# Patient Record
Sex: Male | Born: 1965
Health system: Southern US, Community
[De-identification: ages and names within clinical notes are randomized; demographics above are authoritative.]

## PROBLEM LIST (undated history)

## (undated) DIAGNOSIS — I8390 Asymptomatic varicose veins of unspecified lower extremity: Secondary | ICD-10-CM

## (undated) DIAGNOSIS — R32 Unspecified urinary incontinence: Secondary | ICD-10-CM

## (undated) DIAGNOSIS — F509 Eating disorder, unspecified: Secondary | ICD-10-CM

## (undated) DIAGNOSIS — E559 Vitamin D deficiency, unspecified: Secondary | ICD-10-CM

## (undated) DIAGNOSIS — L0291 Cutaneous abscess, unspecified: Secondary | ICD-10-CM

## (undated) DIAGNOSIS — F329 Major depressive disorder, single episode, unspecified: Secondary | ICD-10-CM

## (undated) DIAGNOSIS — F32A Depression, unspecified: Secondary | ICD-10-CM

## (undated) DIAGNOSIS — R519 Headache, unspecified: Secondary | ICD-10-CM

## (undated) DIAGNOSIS — H6690 Otitis media, unspecified, unspecified ear: Secondary | ICD-10-CM

## (undated) DIAGNOSIS — G43909 Migraine, unspecified, not intractable, without status migrainosus: Secondary | ICD-10-CM

## (undated) DIAGNOSIS — K759 Inflammatory liver disease, unspecified: Secondary | ICD-10-CM

## (undated) DIAGNOSIS — D494 Neoplasm of unspecified behavior of bladder: Secondary | ICD-10-CM

## (undated) DIAGNOSIS — K219 Gastro-esophageal reflux disease without esophagitis: Secondary | ICD-10-CM

## (undated) DIAGNOSIS — R768 Other specified abnormal immunological findings in serum: Secondary | ICD-10-CM

## (undated) DIAGNOSIS — R51 Headache: Secondary | ICD-10-CM

## (undated) HISTORY — DX: Eating disorder, unspecified: F50.9

## (undated) HISTORY — DX: Headache: R51

## (undated) HISTORY — DX: Headache, unspecified: R51.9

## (undated) HISTORY — DX: Migraine, unspecified, not intractable, without status migrainosus: G43.909

## (undated) HISTORY — DX: Inflammatory liver disease, unspecified: K75.9

## (undated) HISTORY — DX: Unspecified urinary incontinence: R32

## (undated) HISTORY — DX: Gastro-esophageal reflux disease without esophagitis: K21.9

---

## 2005-04-02 ENCOUNTER — Ambulatory Visit: Payer: Self-pay | Admitting: Internal Medicine

## 2005-11-19 ENCOUNTER — Ambulatory Visit (HOSPITAL_COMMUNITY): Admission: RE | Admit: 2005-11-19 | Discharge: 2005-11-19 | Payer: Self-pay | Admitting: Internal Medicine

## 2005-11-19 ENCOUNTER — Ambulatory Visit: Payer: Self-pay | Admitting: Internal Medicine

## 2005-12-03 ENCOUNTER — Ambulatory Visit: Payer: Self-pay | Admitting: Internal Medicine

## 2006-06-01 ENCOUNTER — Ambulatory Visit: Payer: Self-pay | Admitting: Internal Medicine

## 2006-06-01 ENCOUNTER — Ambulatory Visit (HOSPITAL_COMMUNITY): Admission: RE | Admit: 2006-06-01 | Discharge: 2006-06-01 | Payer: Self-pay | Admitting: Internal Medicine

## 2013-09-27 ENCOUNTER — Encounter (HOSPITAL_COMMUNITY): Payer: Self-pay | Admitting: Emergency Medicine

## 2013-09-27 ENCOUNTER — Emergency Department (HOSPITAL_COMMUNITY)
Admission: EM | Admit: 2013-09-27 | Discharge: 2013-09-27 | Disposition: A | Discharge status: Home / Self Care | Payer: Self-pay | Attending: Emergency Medicine | Admitting: Emergency Medicine

## 2013-09-27 DIAGNOSIS — L0231 Cutaneous abscess of buttock: Secondary | ICD-10-CM | POA: Insufficient documentation

## 2013-09-27 DIAGNOSIS — Z79899 Other long term (current) drug therapy: Secondary | ICD-10-CM | POA: Insufficient documentation

## 2013-09-27 DIAGNOSIS — F17200 Nicotine dependence, unspecified, uncomplicated: Secondary | ICD-10-CM | POA: Insufficient documentation

## 2013-09-27 DIAGNOSIS — L0291 Cutaneous abscess, unspecified: Secondary | ICD-10-CM

## 2013-09-27 MED ORDER — OXYCODONE-ACETAMINOPHEN 5-325 MG PO TABS: 2.0000 | ORAL_TABLET | ORAL | Status: DC | PRN

## 2013-09-27 MED ORDER — CEPHALEXIN 500 MG PO CAPS: 500.0000 mg | ORAL_CAPSULE | Freq: Four times a day (QID) | ORAL | Status: DC

## 2013-09-27 MED ORDER — SULFAMETHOXAZOLE-TRIMETHOPRIM 800-160 MG PO TABS: 1.0000 | ORAL_TABLET | Freq: Two times a day (BID) | ORAL | Status: DC

## 2013-09-27 NOTE — ED Notes (Signed)
Pt discharged home with all belongings, alert and ambulatory upon discharge, 3 new rx prescribed pt verbalizes understanding of discharge instructions, NAD, no narcotics given in ED, pt drove self home

## 2013-09-27 NOTE — ED Provider Notes (Signed)
CSN: [id number]     Arrival date & time 09/27/13  0259 History   First MD Initiated Contact with Patient 09/27/13 [phone]     Chief Complaint  Patient presents with  . Abscess   (Consider location/radiation/quality/duration/timing/severity/associated sxs/prior Treatment) HPI Comments: Patient is a 46 year old male with no significant past medical history who presents with an abscess to the right buttock for the past week. Symptoms started gradually and progressively worsened. Patient reports throbbing pain to the affected area that does not radiate. The pain is severe and palpation of the area makes the pain worse. He reports associated redness and swelling. No alleviating factors. No associated symptoms. Patient denies fever, abdominal pain.    History reviewed. No pertinent past medical history. History reviewed. No pertinent past surgical history. No family history on file. History  Substance Use Topics  . Smoking status: Current Every Day Smoker  . Smokeless tobacco: Not on file  . Alcohol Use: Yes     Comment: rare    Review of Systems  Constitutional: Negative for fever, chills and fatigue.  HENT: Negative for trouble swallowing.   Eyes: Negative for visual disturbance.  Respiratory: Negative for shortness of breath.   Cardiovascular: Negative for chest pain and palpitations.  Gastrointestinal: Negative for nausea, vomiting, abdominal pain and diarrhea.  Genitourinary: Negative for dysuria and difficulty urinating.  Musculoskeletal: Negative for arthralgias and neck pain.  Skin: Positive for color change and wound.  Neurological: Negative for dizziness and weakness.  Psychiatric/Behavioral: Negative for dysphoric mood.    Allergies  Review of patient's allergies indicates no known allergies.  Home Medications   Current Outpatient Rx  Name  Route  Sig  Dispense  Refill  . Pseudoeph-Doxylamine-DM-APAP (NYQUIL PO)   Oral   Take 1-2 capsules by mouth at bedtime. Cough and  sleep          BP 107/62  Pulse 82  Temp(Src) 97.7 F (36.5 C) (Oral)  Resp 20  Wt 265 lb (120.203 kg)  SpO2 97% Physical Exam  Nursing note and vitals reviewed. Constitutional: He is oriented to person, place, and time. He appears well-developed and well-nourished. No distress.  HENT:  Head: Normocephalic and atraumatic.  Eyes: Conjunctivae and EOM are normal.  Neck: Normal range of motion.  Cardiovascular: Normal rate and regular rhythm.  Exam reveals no gallop and no friction rub.   No murmur heard. Pulmonary/Chest: Effort normal and breath sounds normal. He has no wheezes. He has no rales. He exhibits no tenderness.  Abdominal: Soft. He exhibits no distension. There is no tenderness. There is no rebound and no guarding.  Genitourinary:  6x4cm area of erythema and induration with central fluctuance of right buttock near anus. The area is tender to palpation. No rectal involvement noted on rectal exam.   Musculoskeletal: Normal range of motion.  Neurological: He is alert and oriented to person, place, and time. Coordination normal.  Speech is goal-oriented. Moves limbs without ataxia.   Skin: Skin is warm and dry.  Psychiatric: He has a normal mood and affect. His behavior is normal.    ED Course  Procedures (including critical care time)  INCISION AND DRAINAGE Performed by: Emilia Beck Consent: Verbal consent obtained. Risks and benefits: risks, benefits and alternatives were discussed Type: abscess  Body area: right buttock  Anesthesia: local infiltration  Incision was made with a scalpel.  Local anesthetic: lidocaine 2% without epinephrine  Anesthetic total: 3 ml  Complexity: complex Blunt dissection to break up loculations  Drainage: purulent  Drainage amount: 30 mL  Packing material: none  Patient tolerance: Patient tolerated the procedure well with no immediate complications.      Labs Review Labs Reviewed - No data to display Imaging  Review No results found.  EKG Interpretation   None       MDM   1. Abscess and cellulitis     6:54 AM Abscess drained without complications. Wound cultured. Patient will be discharged with Percocet and bactrim and Keflex for cellulitis. Vitals stable and patient afebrile. No rectal involvement noted. Patient instructed to return with worsening or concerning symptoms.    Emilia Beck, [place] 09/27/13 [phone]

## 2013-09-27 NOTE — ED Provider Notes (Signed)
Medical screening examination/treatment/procedure(s) were performed by non-physician practitioner and as supervising physician I was immediately available for consultation/collaboration.   Dione Booze, MD 09/27/13 [phone]

## 2013-09-27 NOTE — ED Notes (Addendum)
Pt is here with large abscess to rightt buttocks into rectal area.  Pt states started hurting and getting larger on Saturday.  No diabetes.  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

## 2013-09-27 NOTE — ED Notes (Signed)
Pt has a large size nondraining abscess to right buttocks area.  Pt can only lay on right side for comfort.

## 2016-06-17 ENCOUNTER — Ambulatory Visit (INDEPENDENT_AMBULATORY_CARE_PROVIDER_SITE_OTHER): Payer: Managed Care, Other (non HMO) | Admitting: Family Medicine

## 2016-06-17 VITALS — BP 122/72 | HR 82 | Temp 98.1°F | Resp 17 | Ht 75.5 in | Wt 267.0 lb

## 2016-06-17 DIAGNOSIS — M5442 Lumbago with sciatica, left side: Secondary | ICD-10-CM

## 2016-06-17 MED ORDER — KETOROLAC TROMETHAMINE 30 MG/ML IJ SOLN
30.0000 mg | Freq: Once | INTRAMUSCULAR | Status: AC
  Administered 2016-06-17: 30 mg via INTRAMUSCULAR

## 2016-06-17 MED ORDER — KETOROLAC TROMETHAMINE 30 MG/ML IJ SOLN: 30.0000 mg | Freq: Once | INTRAMUSCULAR | Status: DC

## 2016-06-17 NOTE — Progress Notes (Signed)
   Christian Kennedy is a 50 y.o. male who presents to [facility] today for back pain  1.  Back pain:  Describes aching pain in BL lumbar region of back, worse when sitting for prolonged periods of time.  Started on Monday when he was bending over to pick up something at work. Felt sudden sharp stabbing pain in her left lower back. Radiates to her right lower back..  Not worse on left versus right side. Pain is 8 / 10, and is relieved with 600 mg Ibuprofen.  Has been taking tramadol at home but this doesn't help..  Does little baseline exercise.  No prior history of back problems.   Does have some radiation of tingling and cold sensation left back of thigh to left knee.   No LE weakness or changes in gait.  No motor weakness/decreased sensation BL LE's.  No fevers or chills.  No dysuria, hematuria, urinary frequency, incontinence of bladder or bowel.      ROS as above.  Marland Kitchen   PMH reviewed. Patient is a nonsmoker.   No past medical history on file. No past surgical history on file.   Family History: No history of back issues or cancers.  Medications reviewed. Current Outpatient Prescriptions  Medication Sig Dispense Refill  . omeprazole (PRILOSEC) 10 MG capsule Take 10 mg by mouth daily.     No current facility-administered medications for this visit.      Physical Exam:  BP 122/72 (BP Location: Right Arm, Patient Position: Sitting, Cuff Size: Normal)   Pulse 82   Temp 98.1 F (36.7 C) (Oral)   Resp 17   Ht 6' 3.5" (1.918 m)   Wt 267 lb (121.1 kg)   SpO2 98%   BMI 32.93 kg/m  Gen:  Alert, cooperative patient who appears stated age in no acute distress.  Vital signs reviewed. HEENT: EOMI,  MMM Pulm:  Clear to auscultation bilaterally with good air movement.  No wheezes or rales noted.   Back:  Normal skin, Spine with normal alignment and no deformity.  No tenderness to vertebral process palpation.  Paraspinous muscles are tender and with palpable spasm Left lumbar  region.   Range of motion is full at neck and decreased at lumbar sacral regions.  Straight leg raise is positive for Left sided back pain Neuro:  Sensation and motor function 5/5 bilateral lower extremities.  Patellar and Achilles  DTR's +1 patellar BL.  He is able to walk on his heels and toes without difficulty.    Assessment and Plan:  1.  Lumbago with sciatica on Left side: - No red flags on exam or by history. -Does have palpable muscle spasm left lower lumbar region. -He has a friend who is a Restaurant manager, fast food and he himself has been trained in spite of manipulation. He'll make an appointment to see a chiropractor. -Continue ibuprofen for anti-inflammatory and pain reliever effects. Toradol 30 mg given here today for the same. He is planning on going back to work now. -Flexeril as needed for muscle relaxant. Drowsiness precautions provided. -Heat and massage as needed.  See instructions.

## 2016-06-17 NOTE — Patient Instructions (Addendum)
You have a bad muscle spasm.  Take the Ibuprofen 600 mg every 8 hours or so for pain relief and anti-inflammatory effects.  Take the Flexeril as a muscle relaxer at night. This may make you drowsy and if it does do not take during the day.  Heat and massage are also great to help relieve the pain.  This can last for the next 7-10 days. If you're still having issues in the next 2 weeks come back and see [place].  If you start having worsening pain despite the treatment don't wait and come back immediately.   Sciatica Sciatica is pain, weakness, numbness, or tingling along the path of the sciatic nerve. The nerve starts in the lower back and runs down the back of each leg. The nerve controls the muscles in the lower leg and in the back of the knee, while also providing sensation to the back of the thigh, lower leg, and the sole of your foot. Sciatica is a symptom of another medical condition. For instance, nerve damage or certain conditions, such as a herniated disk or bone spur on the spine, pinch or put pressure on the sciatic nerve. This causes the pain, weakness, or other sensations normally associated with sciatica. Generally, sciatica only affects one side of the body. CAUSES   Herniated or slipped disc.  Degenerative disk disease.  A pain disorder involving the narrow muscle in the buttocks (piriformis syndrome).  Pelvic injury or fracture.  Pregnancy.  Tumor (rare). SYMPTOMS  Symptoms can vary from mild to very severe. The symptoms usually travel from the low back to the buttocks and down the back of the leg. Symptoms can include:  Mild tingling or dull aches in the lower back, leg, or hip.  Numbness in the back of the calf or sole of the foot.  Burning sensations in the lower back, leg, or hip.  Sharp pains in the lower back, leg, or hip.  Leg weakness.  Severe back pain inhibiting movement. These symptoms may get worse with coughing, sneezing, laughing, or prolonged  sitting or standing. Also, being overweight may worsen symptoms. DIAGNOSIS  Your caregiver will perform a physical exam to look for common symptoms of sciatica. He or she may ask you to do certain movements or activities that would trigger sciatic nerve pain. Other tests may be performed to find the cause of the sciatica. These may include:  Blood tests.  X-rays.  Imaging tests, such as an MRI or CT scan. TREATMENT  Treatment is directed at the cause of the sciatic pain. Sometimes, treatment is not necessary and the pain and discomfort goes away on its own. If treatment is needed, your caregiver may suggest:  Over-the-counter medicines to relieve pain.  Prescription medicines, such as anti-inflammatory medicine, muscle relaxants, or narcotics.  Applying heat or ice to the painful area.  Steroid injections to lessen pain, irritation, and inflammation around the nerve.  Reducing activity during periods of pain.  Exercising and stretching to strengthen your abdomen and improve flexibility of your spine. Your caregiver may suggest losing weight if the extra weight makes the back pain worse.  Physical therapy.  Surgery to eliminate what is pressing or pinching the nerve, such as a bone spur or part of a herniated disk. HOME CARE INSTRUCTIONS   Only take over-the-counter or prescription medicines for pain or discomfort as directed by your caregiver.  Apply ice to the affected area for 20 minutes, 3-4 times a day for the first 48-72 hours. Then  try heat in the same way.  Exercise, stretch, or perform your usual activities if these do not aggravate your pain.  Attend physical therapy sessions as directed by your caregiver.  Keep all follow-up appointments as directed by your caregiver.  Do not wear high heels or shoes that do not provide proper support.  Check your mattress to see if it is too soft. A firm mattress may lessen your pain and discomfort. SEEK IMMEDIATE MEDICAL CARE IF:    You lose control of your bowel or bladder (incontinence).  You have increasing weakness in the lower back, pelvis, buttocks, or legs.  You have redness or swelling of your back.  You have a burning sensation when you urinate.  You have pain that gets worse when you lie down or awakens you at night.  Your pain is worse than you have experienced in the past.  Your pain is lasting longer than 4 weeks.  You are suddenly losing weight without reason. MAKE SURE YOU:  Understand these instructions.  Will watch your condition.  Will get help right away if you are not doing well or get worse.   This information is not intended to replace advice given to you by your health care provider. Make sure you discuss any questions you have with your health care provider.   Document Released: 09/08/2001 Document Revised: 06/05/2015 Document Reviewed: 01/24/2012 Elsevier Interactive Patient Education 2016 [employer].    IF you received an x-ray today, you will receive an invoice from [facility]. Please contact [facility] at [phone] with questions or concerns regarding your invoice.   IF you received labwork today, you will receive an invoice from [employer]. Please contact [facility] at [phone] with questions or concerns regarding your invoice.   Our billing staff will not be able to assist you with questions regarding bills from these companies.  You will be contacted with the lab results as soon as they are available. The fastest way to get your results is to activate your My Chart account. Instructions are located on the last page of this paperwork. If you have not heard from [place] regarding the results in 2 weeks, please contact this office.

## 2016-06-29 ENCOUNTER — Telehealth: Payer: Self-pay | Admitting: Family Medicine

## 2016-06-29 ENCOUNTER — Other Ambulatory Visit: Payer: Self-pay | Admitting: Emergency Medicine

## 2016-06-29 MED ORDER — CYCLOBENZAPRINE HCL 10 MG PO TABS
10.0000 mg | ORAL_TABLET | Freq: Three times a day (TID) | ORAL | 0 refills | Status: DC | PRN
Start: 2016-06-29 — End: 2016-06-29

## 2016-06-29 MED ORDER — CYCLOBENZAPRINE HCL 10 MG PO TABS: 10.0000 mg | ORAL_TABLET | Freq: Three times a day (TID) | ORAL | 0 refills | Status: DC | PRN

## 2016-06-29 NOTE — Telephone Encounter (Signed)
Pt daughter calling wanting the correct medicine sent to the [facility] [place] in [place] it should be flexeril not Prilosec

## 2016-06-29 NOTE — Progress Notes (Unsigned)
Flexeril 10 mg tab as needed #30, [facility] refill [facility]  Incorrect medication ordered per Dr. Mingo Amber note

## 2016-06-30 NOTE — Telephone Encounter (Signed)
Spoke with pt and pharmacy. Pt has correct medication now.

## 2017-08-23 ENCOUNTER — Telehealth: Payer: Self-pay | Admitting: General Practice

## 2017-08-23 NOTE — Telephone Encounter (Signed)
Pt wife called asking if you will except as a new patient  Please advise

## 2017-08-23 NOTE — Telephone Encounter (Signed)
Ok Thx 

## 2017-08-24 NOTE — Telephone Encounter (Signed)
Tried contacting patient to give response and none of the phone numbers work for the patient.

## 2017-08-26 NOTE — Telephone Encounter (Signed)
A new pt appt cannot be worked in sooner

## 2017-08-26 NOTE — Telephone Encounter (Signed)
I have scheduled patient for 09/15/17 at 1:45pm.  Spouse is requesting patient to be worked in sooner due to stomach pain.  I have advised spouse to take patient to ED or urgent care but would still inquire if new pt appt could be worked in sooner.

## 2017-08-27 NOTE — Telephone Encounter (Signed)
Tried to leave patient's spouse a vm but no VM set up and one number out of service.

## 2017-09-15 ENCOUNTER — Ambulatory Visit: Payer: BLUE CROSS/BLUE SHIELD | Admitting: Internal Medicine

## 2017-09-15 ENCOUNTER — Other Ambulatory Visit (INDEPENDENT_AMBULATORY_CARE_PROVIDER_SITE_OTHER): Payer: BLUE CROSS/BLUE SHIELD

## 2017-09-15 ENCOUNTER — Encounter: Payer: Self-pay | Admitting: Internal Medicine

## 2017-09-15 VITALS — BP 124/78 | HR 92 | Temp 98.7°F | Ht 75.5 in | Wt 283.0 lb

## 2017-09-15 DIAGNOSIS — R140 Abdominal distension (gaseous): Secondary | ICD-10-CM | POA: Diagnosis not present

## 2017-09-15 DIAGNOSIS — E559 Vitamin D deficiency, unspecified: Secondary | ICD-10-CM

## 2017-09-15 DIAGNOSIS — F4321 Adjustment disorder with depressed mood: Secondary | ICD-10-CM

## 2017-09-15 DIAGNOSIS — Z0000 Encounter for general adult medical examination without abnormal findings: Secondary | ICD-10-CM

## 2017-09-15 DIAGNOSIS — R1031 Right lower quadrant pain: Secondary | ICD-10-CM | POA: Diagnosis not present

## 2017-09-15 DIAGNOSIS — Z634 Disappearance and death of family member: Secondary | ICD-10-CM

## 2017-09-15 DIAGNOSIS — R300 Dysuria: Secondary | ICD-10-CM | POA: Insufficient documentation

## 2017-09-15 DIAGNOSIS — R112 Nausea with vomiting, unspecified: Secondary | ICD-10-CM | POA: Diagnosis not present

## 2017-09-15 DIAGNOSIS — K2100 Gastro-esophageal reflux disease with esophagitis, without bleeding: Secondary | ICD-10-CM

## 2017-09-15 DIAGNOSIS — R5382 Chronic fatigue, unspecified: Secondary | ICD-10-CM

## 2017-09-15 DIAGNOSIS — R1011 Right upper quadrant pain: Secondary | ICD-10-CM | POA: Insufficient documentation

## 2017-09-15 DIAGNOSIS — G43909 Migraine, unspecified, not intractable, without status migrainosus: Secondary | ICD-10-CM | POA: Insufficient documentation

## 2017-09-15 DIAGNOSIS — E291 Testicular hypofunction: Secondary | ICD-10-CM

## 2017-09-15 DIAGNOSIS — R5383 Other fatigue: Secondary | ICD-10-CM | POA: Insufficient documentation

## 2017-09-15 LAB — CBC WITH DIFFERENTIAL/PLATELET
BASOS ABS: 0 10*3/uL (ref 0.0–0.1)
Basophils Relative: 0.5 % (ref 0.0–3.0)
EOS ABS: 0.1 10*3/uL (ref 0.0–0.7)
Eosinophils Relative: 1.5 % (ref 0.0–5.0)
HCT: 46.8 % (ref 39.0–52.0)
Hemoglobin: 15.9 g/dL (ref 13.0–17.0)
LYMPHS ABS: 2.4 10*3/uL (ref 0.7–4.0)
Lymphocytes Relative: 24.5 % (ref 12.0–46.0)
MCHC: 34 g/dL (ref 30.0–36.0)
MCV: 91.3 fl (ref 78.0–100.0)
MONO ABS: 0.5 10*3/uL (ref 0.1–1.0)
Monocytes Relative: 5.6 % (ref 3.0–12.0)
NEUTROS PCT: 67.9 % (ref 43.0–77.0)
Neutro Abs: 6.5 10*3/uL (ref 1.4–7.7)
Platelets: 219 10*3/uL (ref 150.0–400.0)
RBC: 5.13 Mil/uL (ref 4.22–5.81)
RDW: 13.3 % (ref 11.5–15.5)
WBC: 9.6 10*3/uL (ref 4.0–10.5)

## 2017-09-15 LAB — BASIC METABOLIC PANEL
BUN: 13 mg/dL (ref 6–23)
CALCIUM: 9.4 mg/dL (ref 8.4–10.5)
CO2: 28 meq/L (ref 19–32)
Chloride: 105 mEq/L (ref 96–112)
Creatinine, Ser: 0.74 mg/dL (ref 0.40–1.50)
GFR: 117.82 mL/min (ref >60.00)
GLUCOSE: 88 mg/dL (ref 70–99)
Potassium: 4.5 mEq/L (ref 3.5–5.1)
SODIUM: 139 meq/L (ref 135–145)

## 2017-09-15 LAB — LIPID PANEL
CHOL/HDL RATIO: 5
Cholesterol: 194 mg/dL (ref 0–200)
HDL: 36.1 mg/dL — AB (ref >39.00)
NonHDL: 157.97
TRIGLYCERIDES: 237 mg/dL — AB (ref 0.0–149.0)
VLDL: 47.4 mg/dL — AB (ref 0.0–40.0)

## 2017-09-15 LAB — URINALYSIS, ROUTINE W REFLEX MICROSCOPIC
BILIRUBIN URINE: NEGATIVE
KETONES UR: NEGATIVE
LEUKOCYTES UA: NEGATIVE
Nitrite: NEGATIVE
SPECIFIC GRAVITY, URINE: 1.025 (ref 1.000–1.030)
Total Protein, Urine: NEGATIVE
UROBILINOGEN UA: 0.2 (ref 0.0–1.0)
Urine Glucose: NEGATIVE
pH: 5.5 (ref 5.0–8.0)

## 2017-09-15 LAB — VITAMIN D 25 HYDROXY (VIT D DEFICIENCY, FRACTURES): VITD: 15.9 ng/mL — ABNORMAL LOW (ref 30.00–100.00)

## 2017-09-15 LAB — HEPATIC FUNCTION PANEL
ALT: 17 U/L (ref 0–53)
AST: 11 U/L (ref 0–37)
Albumin: 4.6 g/dL (ref 3.5–5.2)
Alkaline Phosphatase: 56 U/L (ref 39–117)
BILIRUBIN DIRECT: 0.1 mg/dL (ref 0.0–0.3)
BILIRUBIN TOTAL: 0.6 mg/dL (ref 0.2–1.2)
Total Protein: 7.5 g/dL (ref 6.0–8.3)

## 2017-09-15 LAB — PSA: PSA: 1.3 ng/mL (ref 0.10–4.00)

## 2017-09-15 LAB — VITAMIN B12: VITAMIN B 12: 501 pg/mL (ref 211–911)

## 2017-09-15 LAB — TESTOSTERONE: Testosterone: 261.7 ng/dL — ABNORMAL LOW (ref 300.00–890.00)

## 2017-09-15 LAB — TSH: TSH: 0.61 u[IU]/mL (ref 0.35–4.50)

## 2017-09-15 LAB — H. PYLORI ANTIBODY, IGG: H Pylori IgG: POSITIVE — AB

## 2017-09-15 LAB — SEDIMENTATION RATE: SED RATE: 7 mm/h (ref 0–20)

## 2017-09-15 LAB — LDL CHOLESTEROL, DIRECT: Direct LDL: 148 mg/dL

## 2017-09-15 MED ORDER — DEXLANSOPRAZOLE 60 MG PO CPDR: 60.0000 mg | DELAYED_RELEASE_CAPSULE | Freq: Every day | ORAL | 11 refills | Status: DC

## 2017-09-15 MED ORDER — FAMOTIDINE 40 MG PO TABS: 40.0000 mg | ORAL_TABLET | Freq: Two times a day (BID) | ORAL | 3 refills | Status: DC

## 2017-09-15 MED ORDER — PANTOPRAZOLE SODIUM 40 MG PO TBEC: 40.0000 mg | DELAYED_RELEASE_TABLET | Freq: Every day | ORAL | 3 refills | Status: DC

## 2017-09-15 NOTE — Progress Notes (Signed)
Subjective:  Patient ID: Christian Kennedy, male    DOB: 10/08/1965  Age: 51 y.o. MRN: [id number]  CC: No chief complaint on file.   HPI Christian Kennedy presents for a new pt well visit. He took ampicillin x 10 d finished last Sat - better. Urine was turbid. C/o burning w/urination x 4 mo off and on and urgency C/o depression, apathy. Son died 4 years ago. The pt has 2 living dtrs, 3 grandchildren  C/o RLQ abd pain x weeks off and on - worse C/o low libido C/o GERD - very severe; PPIs don't help as they did  Outpatient Medications Prior to Visit  Medication Sig Dispense Refill  . B Complex-C (SUPER B COMPLEX PO) Take by mouth.    . famotidine (PEPCID) 40 MG tablet Take 40 mg by mouth daily.    . cyclobenzaprine (FLEXERIL) 10 MG tablet Take 1 tablet (10 mg total) by mouth 3 (three) times daily as needed for muscle spasms. 30 tablet 0  . omeprazole (PRILOSEC) 10 MG capsule Take 10 mg by mouth daily.     No facility-administered medications prior to visit.     ROS Review of Systems  Constitutional: Negative for appetite change, fatigue and unexpected weight change.  HENT: Negative for congestion, nosebleeds, sneezing, sore throat and trouble swallowing.   Eyes: Negative for itching and visual disturbance.  Respiratory: Negative for cough.   Cardiovascular: Negative for chest pain, palpitations and leg swelling.  Gastrointestinal: Negative for abdominal distention, blood in stool, diarrhea and nausea.  Genitourinary: Positive for dysuria, frequency and urgency. Negative for hematuria.  Musculoskeletal: Negative for back pain, gait problem, joint swelling and neck pain.  Skin: Negative for rash.  Neurological: Negative for dizziness, tremors, speech difficulty and weakness.  Psychiatric/Behavioral: Negative for agitation, dysphoric mood and sleep disturbance. The patient is not nervous/anxious.     Objective:  BP 124/78 (BP Location: Right Arm, Patient Position: Sitting, Cuff Size:  Large)   Pulse 92   Temp 98.7 F (37.1 C) (Oral)   Ht 6' 3.5" (1.918 m)   Wt 283 lb (128.4 kg)   SpO2 98%   BMI 34.91 kg/m   BP Readings from Last 3 Encounters:  09/15/17 124/78  06/17/16 122/72  09/27/13 104/62    Wt Readings from Last 3 Encounters:  09/15/17 283 lb (128.4 kg)  06/17/16 267 lb (121.1 kg)  09/27/13 265 lb (120.2 kg)    Physical Exam  Constitutional: He is oriented to person, place, and time. He appears well-developed. No distress.  NAD  HENT:  Mouth/Throat: Oropharynx is clear and moist.  Eyes: Conjunctivae are normal. Pupils are equal, round, and reactive to light.  Neck: Normal range of motion. No JVD present. No thyromegaly present.  Cardiovascular: Normal rate, regular rhythm, normal heart sounds and intact distal pulses. Exam reveals no gallop and no friction rub.  No murmur heard. Pulmonary/Chest: Effort normal and breath sounds normal. No respiratory distress. He has no wheezes. He has no rales. He exhibits no tenderness.  Abdominal: Soft. Bowel sounds are normal. He exhibits no distension and no mass. There is no tenderness. There is no rebound and no guarding.  Musculoskeletal: Normal range of motion. He exhibits no edema or tenderness.  Lymphadenopathy:    He has no cervical adenopathy.  Neurological: He is alert and oriented to person, place, and time. He has normal reflexes. No cranial nerve deficit. He exhibits normal muscle tone. He displays a negative Romberg sign. Coordination and gait normal.  Skin: Skin is warm and dry. No rash noted.  Psychiatric: He has a normal mood and affect. His behavior is normal. Judgment and thought content normal.    Procedure: EKG Indication: well Impression: NSR. No acute changes.   No results found for: WBC, HGB, HCT, PLT, GLUCOSE, CHOL, TRIG, HDL, LDLDIRECT, LDLCALC, ALT, AST, NA, K, CL, CREATININE, BUN, CO2, TSH, PSA, INR, GLUF, HGBA1C, MICROALBUR  No results found.  Assessment & Plan:   There are  no diagnoses linked to this encounter. I have discontinued Mikhael Roswell's omeprazole and cyclobenzaprine. I am also having him maintain his B Complex-C (SUPER B COMPLEX PO) and famotidine.  No orders of the defined types were placed in this encounter.    Follow-up: No Follow-up on file.  Walker Kehr, MD

## 2017-09-15 NOTE — Assessment & Plan Note (Signed)
Labs US 

## 2017-09-15 NOTE — Assessment & Plan Note (Signed)
We discussed age appropriate health related issues, including available/recomended screening tests and vaccinations. We discussed a need for adhering to healthy diet and exercise. Labs were ordered to be later reviewed . All questions were answered.   

## 2017-09-15 NOTE — Assessment & Plan Note (Addendum)
CT abd Labs 

## 2017-09-16 DIAGNOSIS — Z634 Disappearance and death of family member: Secondary | ICD-10-CM

## 2017-09-16 DIAGNOSIS — K219 Gastro-esophageal reflux disease without esophagitis: Secondary | ICD-10-CM | POA: Insufficient documentation

## 2017-09-16 DIAGNOSIS — E559 Vitamin D deficiency, unspecified: Secondary | ICD-10-CM | POA: Insufficient documentation

## 2017-09-16 DIAGNOSIS — F4321 Adjustment disorder with depressed mood: Secondary | ICD-10-CM | POA: Insufficient documentation

## 2017-09-16 DIAGNOSIS — E291 Testicular hypofunction: Secondary | ICD-10-CM | POA: Insufficient documentation

## 2017-09-16 LAB — HEPATITIS C ANTIBODY
Hepatitis C Ab: NONREACTIVE
SIGNAL TO CUT-OFF: 0.06 (ref <1.00)

## 2017-09-16 MED ORDER — AMOXICILL-CLARITHRO-LANSOPRAZ PO MISC: Freq: Two times a day (BID) | ORAL | 0 refills | Status: DC

## 2017-09-16 MED ORDER — VITAMIN D3 50 MCG (2000 UT) PO CAPS: 2000.0000 [IU] | ORAL_CAPSULE | Freq: Every day | ORAL | 3 refills | Status: DC

## 2017-09-16 MED ORDER — VITAMIN D3 1.25 MG (50000 UT) PO CAPS: 1.0000 | ORAL_CAPSULE | ORAL | 0 refills | Status: DC

## 2017-09-16 NOTE — Assessment & Plan Note (Signed)
Treat H pylori

## 2017-09-16 NOTE — Assessment & Plan Note (Signed)
Start Vit D prescription 50000 iu weekly (Rx emailed to your pharmacy) followed by over-the-counter Vit D 2000 iu daily.  

## 2017-09-16 NOTE — Assessment & Plan Note (Signed)
Correct low Vit D Depression/grief is likely playing a big role

## 2017-09-16 NOTE — Assessment & Plan Note (Signed)
Son died 4 years ago 2014.  Reactive depression. Christian Kennedy has adjusted, never recovered

## 2017-09-20 ENCOUNTER — Encounter: Payer: Self-pay | Admitting: Internal Medicine

## 2017-09-22 ENCOUNTER — Encounter: Payer: Self-pay | Admitting: Internal Medicine

## 2017-09-23 ENCOUNTER — Other Ambulatory Visit: Payer: BLUE CROSS/BLUE SHIELD

## 2017-09-30 ENCOUNTER — Inpatient Hospital Stay: Admission: RE | Admit: 2017-09-30 | Payer: BLUE CROSS/BLUE SHIELD | Source: Ambulatory Visit

## 2017-10-01 ENCOUNTER — Encounter: Payer: Self-pay | Admitting: Internal Medicine

## 2017-10-04 ENCOUNTER — Ambulatory Visit (INDEPENDENT_AMBULATORY_CARE_PROVIDER_SITE_OTHER)
Admission: RE | Admit: 2017-10-04 | Discharge: 2017-10-04 | Disposition: A | Discharge status: Home / Self Care | Payer: BLUE CROSS/BLUE SHIELD | Source: Ambulatory Visit | Attending: Internal Medicine | Admitting: Internal Medicine

## 2017-10-04 DIAGNOSIS — R112 Nausea with vomiting, unspecified: Secondary | ICD-10-CM

## 2017-10-04 DIAGNOSIS — R1031 Right lower quadrant pain: Secondary | ICD-10-CM

## 2017-10-04 DIAGNOSIS — R109 Unspecified abdominal pain: Secondary | ICD-10-CM | POA: Diagnosis not present

## 2017-10-04 DIAGNOSIS — R140 Abdominal distension (gaseous): Secondary | ICD-10-CM

## 2017-10-04 MED ORDER — IOPAMIDOL (ISOVUE-300) INJECTION 61%
100.0000 mL | Freq: Once | INTRAVENOUS | Status: AC | PRN
  Administered 2017-10-04: 100 mL via INTRAVENOUS

## 2017-10-05 ENCOUNTER — Other Ambulatory Visit: Payer: Self-pay | Admitting: Internal Medicine

## 2017-10-05 DIAGNOSIS — R300 Dysuria: Secondary | ICD-10-CM

## 2017-10-05 DIAGNOSIS — N3289 Other specified disorders of bladder: Secondary | ICD-10-CM

## 2017-10-05 MED ORDER — PANTOPRAZOLE SODIUM 40 MG PO TBEC: 40.0000 mg | DELAYED_RELEASE_TABLET | Freq: Two times a day (BID) | ORAL | 3 refills | Status: DC

## 2017-10-05 MED ORDER — CLARITHROMYCIN 500 MG PO TABS: 500.0000 mg | ORAL_TABLET | Freq: Two times a day (BID) | ORAL | 0 refills | Status: DC

## 2017-10-05 MED ORDER — AMOXICILLIN 500 MG PO CAPS: 1000.0000 mg | ORAL_CAPSULE | Freq: Two times a day (BID) | ORAL | 0 refills | Status: DC

## 2017-10-05 NOTE — Progress Notes (Signed)
H pylori meds replaced

## 2017-10-21 ENCOUNTER — Telehealth: Payer: Self-pay

## 2017-10-21 NOTE — Telephone Encounter (Signed)
Key: [id number]

## 2017-10-26 ENCOUNTER — Encounter: Payer: Self-pay | Admitting: Internal Medicine

## 2017-10-26 ENCOUNTER — Ambulatory Visit: Payer: BLUE CROSS/BLUE SHIELD | Admitting: Internal Medicine

## 2017-10-26 DIAGNOSIS — D494 Neoplasm of unspecified behavior of bladder: Secondary | ICD-10-CM | POA: Diagnosis not present

## 2017-10-26 DIAGNOSIS — K2100 Gastro-esophageal reflux disease with esophagitis, without bleeding: Secondary | ICD-10-CM

## 2017-10-26 DIAGNOSIS — R1031 Right lower quadrant pain: Secondary | ICD-10-CM | POA: Diagnosis not present

## 2017-10-26 DIAGNOSIS — R768 Other specified abnormal immunological findings in serum: Secondary | ICD-10-CM | POA: Diagnosis not present

## 2017-10-26 MED ORDER — AMOXICILLIN 500 MG PO CAPS: 1000.0000 mg | ORAL_CAPSULE | Freq: Two times a day (BID) | ORAL | 0 refills | Status: DC

## 2017-10-26 MED ORDER — CLARITHROMYCIN 500 MG PO TABS: 500.0000 mg | ORAL_TABLET | Freq: Two times a day (BID) | ORAL | 0 refills | Status: AC

## 2017-10-26 NOTE — Assessment & Plan Note (Addendum)
CT 12/19 4.6 cm soft tissue mass within the urinary bladder, arising in the region of the right trigone, with likely involvement of the distal right ureter. Findings are highly suspicious for primary transitional cell carcinoma. Intramural urinary bladder hematoma may have a similar appearance but is considered less likely.   Will resch Urol appt ASAP

## 2017-10-26 NOTE — Progress Notes (Signed)
Subjective:  Patient ID: Christian Kennedy, male    DOB: 01-01-1966  Age: 52 y.o. MRN: [id number]  CC: No chief complaint on file.   HPI Christian Kennedy presents for abd pain, grief, bladder tumor f/u. He has not taken H pylori Rx DMV form  Outpatient Medications Prior to Visit  Medication Sig Dispense Refill  . B Complex-C (SUPER B COMPLEX PO) Take by mouth.    . Cholecalciferol (VITAMIN D3) 2000 units capsule Take 1 capsule (2,000 Units total) by mouth daily. 100 capsule 3  . Cholecalciferol (VITAMIN D3) 50000 units CAPS Take 1 capsule by mouth once a week. 6 capsule 0  . famotidine (PEPCID) 40 MG tablet Take 1 tablet (40 mg total) by mouth 2 (two) times daily. 180 tablet 3  . pantoprazole (PROTONIX) 40 MG tablet Take 1 tablet (40 mg total) by mouth 2 (two) times daily. 60 tablet 3  . amoxicillin (AMOXIL) 500 MG capsule Take 2 capsules (1,000 mg total) by mouth 2 (two) times daily. 40 capsule 0   No facility-administered medications prior to visit.     ROS Review of Systems  Constitutional: Negative for appetite change, fatigue and unexpected weight change.  HENT: Negative for congestion, nosebleeds, sneezing, sore throat and trouble swallowing.   Eyes: Negative for itching and visual disturbance.  Respiratory: Negative for cough.   Cardiovascular: Negative for chest pain, palpitations and leg swelling.  Gastrointestinal: Negative for abdominal distention, blood in stool, diarrhea and nausea.  Genitourinary: Negative for frequency and hematuria.  Musculoskeletal: Negative for back pain, gait problem, joint swelling and neck pain.  Skin: Negative for rash.  Neurological: Negative for dizziness, tremors, speech difficulty and weakness.  Psychiatric/Behavioral: Negative for agitation, dysphoric mood and sleep disturbance. The patient is not nervous/anxious.     Objective:  BP 128/82 (BP Location: Left Arm, Patient Position: Sitting, Cuff Size: Large)   Pulse 96   Temp 98.2 F  (36.8 C) (Oral)   Ht 6' 3.5" (1.918 m)   Wt 275 lb (124.7 kg)   SpO2 98%   BMI 33.92 kg/m   BP Readings from Last 3 Encounters:  10/26/17 128/82  09/15/17 124/78  06/17/16 122/72    Wt Readings from Last 3 Encounters:  10/26/17 275 lb (124.7 kg)  09/15/17 283 lb (128.4 kg)  06/17/16 267 lb (121.1 kg)    Physical Exam  Constitutional: He is oriented to person, place, and time. He appears well-developed. No distress.  NAD  HENT:  Mouth/Throat: Oropharynx is clear and moist.  Eyes: Conjunctivae are normal. Pupils are equal, round, and reactive to light.  Neck: Normal range of motion. No JVD present. No thyromegaly present.  Cardiovascular: Normal rate, regular rhythm, normal heart sounds and intact distal pulses. Exam reveals no gallop and no friction rub.  No murmur heard. Pulmonary/Chest: Effort normal and breath sounds normal. No respiratory distress. He has no wheezes. He has no rales. He exhibits no tenderness.  Abdominal: Soft. Bowel sounds are normal. He exhibits no distension and no mass. There is no tenderness. There is no rebound and no guarding.  Musculoskeletal: Normal range of motion. He exhibits no edema or tenderness.  Lymphadenopathy:    He has no cervical adenopathy.  Neurological: He is alert and oriented to person, place, and time. He has normal reflexes. No cranial nerve deficit. He exhibits normal muscle tone. He displays a negative Romberg sign. Coordination and gait normal.  Skin: Skin is warm and dry. No rash noted.  Psychiatric: He has  a normal mood and affect. His behavior is normal. Judgment and thought content normal.  sad  Lab Results  Component Value Date   WBC 9.6 09/15/2017   HGB 15.9 09/15/2017   HCT 46.8 09/15/2017   PLT 219.0 09/15/2017   GLUCOSE 88 09/15/2017   CHOL 194 09/15/2017   TRIG 237.0 (H) 09/15/2017   HDL 36.10 (L) 09/15/2017   LDLDIRECT 148.0 09/15/2017   ALT 17 09/15/2017   AST 11 09/15/2017   NA 139 09/15/2017   K 4.5  09/15/2017   CL 105 09/15/2017   CREATININE 0.74 09/15/2017   BUN 13 09/15/2017   CO2 28 09/15/2017   TSH 0.61 09/15/2017   PSA 1.30 09/15/2017    Ct Abdomen Pelvis W Contrast  Result Date: 10/04/2017 CLINICAL DATA:  Right-sided abdominal pain and burning with urination. EXAM: CT ABDOMEN AND PELVIS WITH CONTRAST TECHNIQUE: Multidetector CT imaging of the abdomen and pelvis was performed using the standard protocol following bolus administration of intravenous contrast. CONTRAST:  154mL ISOVUE-300 IOPAMIDOL (ISOVUE-300) INJECTION 61% COMPARISON:  Body CT 11/19/2005 FINDINGS: Lower chest: No acute abnormality. Hepatobiliary: No focal liver abnormality is seen. No gallstones, gallbladder wall thickening, or biliary dilatation. Pancreas: Unremarkable. No pancreatic ductal dilatation or surrounding inflammatory changes. Spleen: Normal in size without focal abnormality. Adrenals/Urinary Tract: Normal adrenal glands. Normal appearance of the right kidney. Benign-appearing 1.7 cm left superior pole cyst. Normal appearance of the left ureter. There is a soft tissue mass in the urinary bladder, arising in the region of the right trigone measuring 2.4 x 4.6 x 2.5 cm. There is indistinctness and thickening of the distal most right ureter. Adjacent focal fat stranding is noted. Stomach/Bowel: Stomach is within normal limits. Appendix appears normal. No evidence of bowel wall thickening, distention, or inflammatory changes. Vascular/Lymphatic: Sub pathologic by CT criteria but asymmetric right inguinal lymph nodes. Reproductive: Heterogeneous appearance of the prostate gland. Other: No abdominal wall hernia or abnormality. No abdominopelvic ascites. Musculoskeletal: No acute or significant osseous findings. IMPRESSION: 4.6 cm soft tissue mass within the urinary bladder, arising in the region of the right trigone, with likely involvement of the distal right ureter. Findings are highly suspicious for primary transitional  cell carcinoma. Intramural urinary bladder hematoma may have a similar appearance but is considered less likely. Heterogeneous appearance of the prostate gland. Please correlate to serum PSA values. Sub pathologic but asymmetric lymph nodes along the right ureter and in the right iliac chain. Likely benign left upper pole renal cyst. These results will be called to the ordering clinician or representative by the Radiologist Assistant, and communication documented in the PACS or zVision Dashboard. Electronically Signed   By: Fidela Salisbury M.D.   On: 10/04/2017 19:14    Assessment & Plan:   There are no diagnoses linked to this encounter. I have discontinued Christian Kennedy's amoxicillin. I am also having him maintain his B Complex-C (SUPER B COMPLEX PO), famotidine, Vitamin D3, Vitamin D3, and pantoprazole.  No orders of the defined types were placed in this encounter.    Follow-up: No Follow-up on file.  Walker Kehr, MD

## 2017-10-27 ENCOUNTER — Encounter: Payer: Self-pay | Admitting: Internal Medicine

## 2017-10-27 DIAGNOSIS — R768 Other specified abnormal immunological findings in serum: Secondary | ICD-10-CM

## 2017-10-27 HISTORY — DX: Other specified abnormal immunological findings in serum: R76.8

## 2017-10-27 NOTE — Assessment & Plan Note (Signed)
abx prescribed w/Protonix

## 2017-10-27 NOTE — Assessment & Plan Note (Signed)
abx prescribed w/Protonix for H pylori

## 2017-10-27 NOTE — Assessment & Plan Note (Signed)
Better - treat H pylori; abx prescribed w/Protonix

## 2017-11-11 DIAGNOSIS — R310 Gross hematuria: Secondary | ICD-10-CM | POA: Diagnosis not present

## 2017-11-11 DIAGNOSIS — D414 Neoplasm of uncertain behavior of bladder: Secondary | ICD-10-CM | POA: Diagnosis not present

## 2017-11-12 ENCOUNTER — Encounter (HOSPITAL_BASED_OUTPATIENT_CLINIC_OR_DEPARTMENT_OTHER): Payer: Self-pay

## 2017-11-12 ENCOUNTER — Other Ambulatory Visit: Payer: Self-pay | Admitting: Urology

## 2017-11-12 ENCOUNTER — Other Ambulatory Visit: Payer: Self-pay

## 2017-11-12 NOTE — Progress Notes (Signed)
Spoke with:  Zelig NPO: No food after midnight/Clear liquids until 7:00 AM DOS Arrival time: 11:00AM Labs: Hemoglobin AM medications:  Pepcid Pre op orders: No Ride home:  Christian Kennedy (wife) [phone]

## 2017-11-18 NOTE — H&P (Signed)
Urology Preoperative H&P   Chief Complaint: Bladder mass  History of Present Illness: ARIC JOST is a 52 y.o. male referred by Dayna Ramus, MD after he was found to have a large bladder lesion concerning for malignancy on recent CT abd/pel. He was evaluated in the ED in December 2018 for worsening right flank pain and gross hematuria. Today, he reports dysuria and intermittent right flank pain after voiding, but states that the hematuria has since resolved. He denies nausea/vomiting, fever/chills or interval UTIs.   Tobacco use- 1 ppd for ~30 years.  No personal/family history of GU malignancies.   Last PSA- 1.3 (08/2017)    Past Medical History:  Diagnosis Date  . Abscess    Right buttocks  . Bladder tumor   . Depression    Son passed 2014  . Ear infection   . Eating disorder   . Frequent headaches   . GERD (gastroesophageal reflux disease)   . Helicobacter pylori ab+ 10/27/2017  . Hepatitis    childhood, jaundice  . Migraines   . Urinary incontinence   . Varicose vein of leg    Right leg  . Vitamin D deficiency     History reviewed. No pertinent surgical history.  Allergies: No Known Allergies  Family History  Problem Relation Age of Onset  . Cancer Mother   . Depression Mother   . Arthritis Father   . Depression Father   . Hearing loss Father   . Depression Daughter   . Early death Son     Social History:  reports that he has been smoking cigarettes.  He has a 30.00 pack-year smoking history. he has never used smokeless tobacco. He reports that he drinks alcohol. He reports that he does not use drugs.  ROS: A complete review of systems was performed.  All systems are negative except for pertinent findings as noted.  Physical Exam:  Vital signs in last 24 hours:   Constitutional:  Alert and oriented, No acute distress Cardiovascular: Regular rate and rhythm, No JVD Respiratory: Normal respiratory effort, Lungs clear bilaterally GI: Abdomen is  soft, nontender, nondistended, no abdominal masses GU: No CVA tenderness Lymphatic: No lymphadenopathy Neurologic: Grossly intact, no focal deficits Psychiatric: Normal mood and affect  Laboratory Data:  No results for input(s): WBC, HGB, HCT, PLT in the last 72 hours.  No results for input(s): NA, K, CL, GLUCOSE, BUN, CALCIUM, CREATININE in the last 72 hours.  Invalid input(s): CO3   No results found for this or any previous visit (from the past 24 hour(s)). No results found for this or any previous visit (from the past 240 hour(s)).  Renal Function: No results for input(s): CREATININE in the last 168 hours. CrCl cannot be calculated (Patient's most recent lab result is older than the maximum 21 days allowed.).  Radiologic Imaging: No results found.  I independently reviewed the above imaging studies.  Assessment and Plan DEQUANDRE CORDOVA is a 52 y.o. male with a large bladder mass concerning for malignancy see on recent CT from 08/2017  -Recent CT findings discussed, at length, with the patient. The findings are very concerning for a possible neoplasm involving the right lateral bladder wall and possibly the right ureteral orifice.  The risks, benefits and alternatives of cystoscopy with TURBT, right retrograde pyelogram and possible right JJ stent placement was discussed with the patient. The risks included, but are not limited to, bleeding, urinary tract infection, bladder perforation requiring prolonged catheterization and/or open bladder repair, ureteral  obstruction, voiding dysfunction and the inherent risks of general anesthesia. The patient voices understanding and wishes to proceed.     Ellison Hughs, MD 11/18/2017, 3:02 PM  [facility] Pager: [phone]

## 2017-11-19 ENCOUNTER — Ambulatory Visit (HOSPITAL_BASED_OUTPATIENT_CLINIC_OR_DEPARTMENT_OTHER): Payer: BLUE CROSS/BLUE SHIELD | Admitting: Anesthesiology

## 2017-11-19 ENCOUNTER — Encounter (HOSPITAL_BASED_OUTPATIENT_CLINIC_OR_DEPARTMENT_OTHER): Payer: Self-pay

## 2017-11-19 ENCOUNTER — Ambulatory Visit (HOSPITAL_COMMUNITY): Payer: BLUE CROSS/BLUE SHIELD | Admitting: Certified Registered Nurse Anesthetist

## 2017-11-19 ENCOUNTER — Other Ambulatory Visit: Payer: Self-pay

## 2017-11-19 ENCOUNTER — Encounter (HOSPITAL_COMMUNITY)
Admission: RE | Disposition: A | Discharge status: Home / Self Care | Payer: Self-pay | Source: Ambulatory Visit | Attending: Urology

## 2017-11-19 ENCOUNTER — Inpatient Hospital Stay (HOSPITAL_BASED_OUTPATIENT_CLINIC_OR_DEPARTMENT_OTHER)
Admission: RE | Admit: 2017-11-19 | Discharge: 2017-11-23 | DRG: 657 | Disposition: A | Discharge status: Home / Self Care | Payer: BLUE CROSS/BLUE SHIELD | Source: Ambulatory Visit | Attending: Urology | Admitting: Urology

## 2017-11-19 DIAGNOSIS — D494 Neoplasm of unspecified behavior of bladder: Secondary | ICD-10-CM | POA: Diagnosis not present

## 2017-11-19 DIAGNOSIS — D62 Acute posthemorrhagic anemia: Secondary | ICD-10-CM | POA: Diagnosis not present

## 2017-11-19 DIAGNOSIS — F17210 Nicotine dependence, cigarettes, uncomplicated: Secondary | ICD-10-CM | POA: Diagnosis present

## 2017-11-19 DIAGNOSIS — C679 Malignant neoplasm of bladder, unspecified: Principal | ICD-10-CM | POA: Diagnosis present

## 2017-11-19 DIAGNOSIS — E669 Obesity, unspecified: Secondary | ICD-10-CM | POA: Diagnosis present

## 2017-11-19 DIAGNOSIS — K219 Gastro-esophageal reflux disease without esophagitis: Secondary | ICD-10-CM | POA: Diagnosis present

## 2017-11-19 DIAGNOSIS — N99820 Postprocedural hemorrhage and hematoma of a genitourinary system organ or structure following a genitourinary system procedure: Secondary | ICD-10-CM | POA: Diagnosis not present

## 2017-11-19 DIAGNOSIS — R319 Hematuria, unspecified: Secondary | ICD-10-CM | POA: Diagnosis not present

## 2017-11-19 DIAGNOSIS — Z6833 Body mass index (BMI) 33.0-33.9, adult: Secondary | ICD-10-CM

## 2017-11-19 DIAGNOSIS — I739 Peripheral vascular disease, unspecified: Secondary | ICD-10-CM | POA: Diagnosis present

## 2017-11-19 DIAGNOSIS — K5900 Constipation, unspecified: Secondary | ICD-10-CM | POA: Diagnosis not present

## 2017-11-19 DIAGNOSIS — R310 Gross hematuria: Secondary | ICD-10-CM | POA: Diagnosis not present

## 2017-11-19 DIAGNOSIS — F329 Major depressive disorder, single episode, unspecified: Secondary | ICD-10-CM | POA: Diagnosis not present

## 2017-11-19 DIAGNOSIS — N3289 Other specified disorders of bladder: Secondary | ICD-10-CM | POA: Diagnosis not present

## 2017-11-19 DIAGNOSIS — E559 Vitamin D deficiency, unspecified: Secondary | ICD-10-CM | POA: Diagnosis not present

## 2017-11-19 DIAGNOSIS — Z818 Family history of other mental and behavioral disorders: Secondary | ICD-10-CM

## 2017-11-19 DIAGNOSIS — R339 Retention of urine, unspecified: Secondary | ICD-10-CM | POA: Diagnosis not present

## 2017-11-19 HISTORY — DX: Vitamin D deficiency, unspecified: E55.9

## 2017-11-19 HISTORY — DX: Neoplasm of unspecified behavior of bladder: D49.4

## 2017-11-19 HISTORY — PX: CYSTOSCOPY WITH FULGERATION: SHX6638

## 2017-11-19 HISTORY — DX: Asymptomatic varicose veins of unspecified lower extremity: I83.90

## 2017-11-19 HISTORY — DX: Cutaneous abscess, unspecified: L02.91

## 2017-11-19 HISTORY — PX: TRANSURETHRAL RESECTION OF BLADDER TUMOR: SHX2575

## 2017-11-19 HISTORY — DX: Major depressive disorder, single episode, unspecified: F32.9

## 2017-11-19 HISTORY — DX: Otitis media, unspecified, unspecified ear: H66.90

## 2017-11-19 HISTORY — DX: Depression, unspecified: F32.A

## 2017-11-19 HISTORY — DX: Other specified abnormal immunological findings in serum: R76.8

## 2017-11-19 HISTORY — PX: CYSTOSCOPY W/ URETERAL STENT PLACEMENT: SHX1429

## 2017-11-19 LAB — TYPE AND SCREEN
ABO/RH(D): B POS
Antibody Screen: NEGATIVE

## 2017-11-19 LAB — CBC
HCT: 41.8 % (ref 39.0–52.0)
Hemoglobin: 14.1 g/dL (ref 13.0–17.0)
MCH: 31.1 pg (ref 26.0–34.0)
MCHC: 33.7 g/dL (ref 30.0–36.0)
MCV: 92.3 fL (ref 78.0–100.0)
PLATELETS: 223 10*3/uL (ref 150–400)
RBC: 4.53 MIL/uL (ref 4.22–5.81)
RDW: 13.4 % (ref 11.5–15.5)
WBC: 15.5 10*3/uL — AB (ref 4.0–10.5)

## 2017-11-19 LAB — POCT HEMOGLOBIN-HEMACUE: Hemoglobin: 15.4 g/dL (ref 13.0–17.0)

## 2017-11-19 SURGERY — CYSTOSCOPY, WITH BLADDER FULGURATION
Anesthesia: General | Site: Bladder

## 2017-11-19 SURGERY — CYSTOSCOPY, WITH RETROGRADE PYELOGRAM AND URETERAL STENT INSERTION
Anesthesia: General | Site: Ureter | Laterality: Right

## 2017-11-19 MED ORDER — FENTANYL CITRATE (PF) 100 MCG/2ML IJ SOLN
INTRAMUSCULAR | Status: AC
  Filled 2017-11-19: qty 2

## 2017-11-19 MED ORDER — MEPERIDINE HCL 25 MG/ML IJ SOLN
6.2500 mg | INTRAMUSCULAR | Status: DC | PRN
  Filled 2017-11-19: qty 1

## 2017-11-19 MED ORDER — HYDROMORPHONE HCL 1 MG/ML IJ SOLN
INTRAMUSCULAR | Status: AC
  Filled 2017-11-19: qty 1

## 2017-11-19 MED ORDER — FENTANYL CITRATE (PF) 100 MCG/2ML IJ SOLN
INTRAMUSCULAR | Status: DC | PRN
  Administered 2017-11-19 (×3): 25 ug via INTRAVENOUS
  Administered 2017-11-19: 50 ug via INTRAVENOUS
  Administered 2017-11-19 (×7): 25 ug via INTRAVENOUS

## 2017-11-19 MED ORDER — CEFAZOLIN SODIUM-DEXTROSE 2-4 GM/100ML-% IV SOLN
INTRAVENOUS | Status: AC
  Filled 2017-11-19: qty 100

## 2017-11-19 MED ORDER — MIDAZOLAM HCL 5 MG/5ML IJ SOLN
INTRAMUSCULAR | Status: DC | PRN
  Administered 2017-11-19: 2 mg via INTRAVENOUS

## 2017-11-19 MED ORDER — ONDANSETRON HCL 4 MG/2ML IJ SOLN
INTRAMUSCULAR | Status: AC
  Filled 2017-11-19: qty 2

## 2017-11-19 MED ORDER — PROPOFOL 10 MG/ML IV BOLUS
INTRAVENOUS | Status: AC
  Filled 2017-11-19: qty 20

## 2017-11-19 MED ORDER — PROPOFOL 10 MG/ML IV BOLUS
INTRAVENOUS | Status: DC | PRN
  Administered 2017-11-19: 150 mg via INTRAVENOUS

## 2017-11-19 MED ORDER — DOCUSATE SODIUM 100 MG PO CAPS
100.0000 mg | ORAL_CAPSULE | Freq: Two times a day (BID) | ORAL | Status: DC
  Administered 2017-11-19 – 2017-11-23 (×8): 100 mg via ORAL
  Filled 2017-11-19 (×8): qty 1

## 2017-11-19 MED ORDER — HYDROCODONE-ACETAMINOPHEN 5-325 MG PO TABS: 1.0000 | ORAL_TABLET | ORAL | 0 refills | Status: DC | PRN

## 2017-11-19 MED ORDER — PROMETHAZINE HCL 25 MG/ML IJ SOLN
6.2500 mg | INTRAMUSCULAR | Status: DC | PRN
  Filled 2017-11-19: qty 1

## 2017-11-19 MED ORDER — SODIUM CHLORIDE 0.9 % IR SOLN
500.0000 mL | Freq: Once | Status: DC
  Filled 2017-11-19: qty 500

## 2017-11-19 MED ORDER — OXYBUTYNIN CHLORIDE ER 10 MG PO TB24: 10.0000 mg | ORAL_TABLET | Freq: Every day | ORAL | 0 refills | Status: DC

## 2017-11-19 MED ORDER — OXYCODONE HCL 5 MG PO TABS
5.0000 mg | ORAL_TABLET | Freq: Once | ORAL | Status: DC | PRN
  Filled 2017-11-19: qty 1

## 2017-11-19 MED ORDER — BELLADONNA ALKALOIDS-OPIUM 16.2-60 MG RE SUPP
RECTAL | Status: DC | PRN
  Administered 2017-11-19: 1 via RECTAL

## 2017-11-19 MED ORDER — LIDOCAINE 2% (20 MG/ML) 5 ML SYRINGE
INTRAMUSCULAR | Status: AC
  Filled 2017-11-19: qty 5

## 2017-11-19 MED ORDER — KETOROLAC TROMETHAMINE 30 MG/ML IJ SOLN
INTRAMUSCULAR | Status: DC | PRN
  Administered 2017-11-19: 30 mg via INTRAVENOUS

## 2017-11-19 MED ORDER — ONDANSETRON HCL 4 MG/2ML IJ SOLN
INTRAMUSCULAR | Status: DC | PRN
  Administered 2017-11-19: 4 mg via INTRAVENOUS

## 2017-11-19 MED ORDER — HYDROCODONE-ACETAMINOPHEN 5-325 MG PO TABS
1.0000 | ORAL_TABLET | ORAL | Status: DC | PRN
  Administered 2017-11-20: 2 via ORAL
  Administered 2017-11-20: 1 via ORAL
  Administered 2017-11-20 – 2017-11-23 (×10): 2 via ORAL
  Filled 2017-11-19 (×13): qty 2

## 2017-11-19 MED ORDER — DEXAMETHASONE SODIUM PHOSPHATE 4 MG/ML IJ SOLN
INTRAMUSCULAR | Status: DC | PRN
  Administered 2017-11-19: 10 mg via INTRAVENOUS

## 2017-11-19 MED ORDER — LIDOCAINE 2% (20 MG/ML) 5 ML SYRINGE
INTRAMUSCULAR | Status: DC | PRN
  Administered 2017-11-19: 60 mg via INTRAVENOUS

## 2017-11-19 MED ORDER — OXYCODONE HCL 5 MG/5ML PO SOLN
5.0000 mg | Freq: Once | ORAL | Status: DC | PRN
  Filled 2017-11-19: qty 5

## 2017-11-19 MED ORDER — DEXAMETHASONE SODIUM PHOSPHATE 10 MG/ML IJ SOLN
INTRAMUSCULAR | Status: AC
  Filled 2017-11-19: qty 1

## 2017-11-19 MED ORDER — MORPHINE SULFATE (PF) 4 MG/ML IV SOLN
2.0000 mg | INTRAVENOUS | Status: DC | PRN
  Administered 2017-11-19: 2 mg via INTRAVENOUS
  Administered 2017-11-20 – 2017-11-21 (×6): 4 mg via INTRAVENOUS
  Filled 2017-11-19 (×7): qty 1

## 2017-11-19 MED ORDER — MEPERIDINE HCL 50 MG/ML IJ SOLN: 6.2500 mg | INTRAMUSCULAR | Status: DC | PRN

## 2017-11-19 MED ORDER — CIPROFLOXACIN HCL 500 MG PO TABS: 500.0000 mg | ORAL_TABLET | Freq: Two times a day (BID) | ORAL | 0 refills | Status: DC

## 2017-11-19 MED ORDER — KETOROLAC TROMETHAMINE 30 MG/ML IJ SOLN
INTRAMUSCULAR | Status: AC
  Filled 2017-11-19: qty 1

## 2017-11-19 MED ORDER — IOHEXOL 300 MG/ML  SOLN
INTRAMUSCULAR | Status: DC | PRN
  Administered 2017-11-19: 7 mL

## 2017-11-19 MED ORDER — MIDAZOLAM HCL 2 MG/2ML IJ SOLN
INTRAMUSCULAR | Status: DC | PRN
  Administered 2017-11-19 (×2): 1 mg via INTRAVENOUS

## 2017-11-19 MED ORDER — HYDROMORPHONE HCL 1 MG/ML IJ SOLN
0.2500 mg | INTRAMUSCULAR | Status: DC | PRN
  Filled 2017-11-19: qty 0.5

## 2017-11-19 MED ORDER — SUCCINYLCHOLINE CHLORIDE 200 MG/10ML IV SOSY
PREFILLED_SYRINGE | INTRAVENOUS | Status: DC | PRN
  Administered 2017-11-19: 140 mg via INTRAVENOUS

## 2017-11-19 MED ORDER — CEFAZOLIN SODIUM-DEXTROSE 2-4 GM/100ML-% IV SOLN
2.0000 g | Freq: Three times a day (TID) | INTRAVENOUS | Status: DC
  Administered 2017-11-19 – 2017-11-23 (×11): 2 g via INTRAVENOUS
  Filled 2017-11-19 (×13): qty 100

## 2017-11-19 MED ORDER — FENTANYL CITRATE (PF) 100 MCG/2ML IJ SOLN
INTRAMUSCULAR | Status: DC | PRN
  Administered 2017-11-19 (×2): 50 ug via INTRAVENOUS

## 2017-11-19 MED ORDER — LACTATED RINGERS IV SOLN
INTRAVENOUS | Status: DC | PRN
  Administered 2017-11-19 (×2): via INTRAVENOUS

## 2017-11-19 MED ORDER — MIDAZOLAM HCL 2 MG/2ML IJ SOLN
INTRAMUSCULAR | Status: AC
  Filled 2017-11-19: qty 2

## 2017-11-19 MED ORDER — LIDOCAINE HCL (CARDIAC) 20 MG/ML IV SOLN
INTRAVENOUS | Status: DC | PRN
  Administered 2017-11-19: 100 mg via INTRAVENOUS

## 2017-11-19 MED ORDER — LACTATED RINGERS IV SOLN: INTRAVENOUS | Status: DC

## 2017-11-19 MED ORDER — SODIUM CHLORIDE 0.9 % IR SOLN
Status: DC | PRN
Start: 2017-11-19 — End: 2017-11-19
  Administered 2017-11-19: 9000 mL

## 2017-11-19 MED ORDER — CEFAZOLIN SODIUM-DEXTROSE 2-3 GM-%(50ML) IV SOLR
INTRAVENOUS | Status: DC | PRN
  Administered 2017-11-19: 2 g via INTRAVENOUS

## 2017-11-19 MED ORDER — HYDROMORPHONE HCL 1 MG/ML IJ SOLN
0.2500 mg | INTRAMUSCULAR | Status: DC | PRN
  Administered 2017-11-19 (×4): 0.5 mg via INTRAVENOUS

## 2017-11-19 MED ORDER — PHENAZOPYRIDINE HCL 200 MG PO TABS: 200.0000 mg | ORAL_TABLET | Freq: Three times a day (TID) | ORAL | 0 refills | Status: DC | PRN

## 2017-11-19 MED ORDER — CEFAZOLIN SODIUM-DEXTROSE 2-4 GM/100ML-% IV SOLN
2.0000 g | Freq: Once | INTRAVENOUS | Status: AC
  Administered 2017-11-19: 2 g via INTRAVENOUS
  Filled 2017-11-19: qty 100

## 2017-11-19 MED ORDER — PROPOFOL 10 MG/ML IV BOLUS
INTRAVENOUS | Status: DC | PRN
  Administered 2017-11-19: 100 mg via INTRAVENOUS
  Administered 2017-11-19: 300 mg via INTRAVENOUS

## 2017-11-19 MED ORDER — HYDROMORPHONE HCL 1 MG/ML IJ SOLN
0.2500 mg | INTRAMUSCULAR | Status: DC | PRN
  Administered 2017-11-19 (×4): 0.5 mg via INTRAVENOUS
  Filled 2017-11-19: qty 0.5

## 2017-11-19 MED ORDER — PROMETHAZINE HCL 25 MG/ML IJ SOLN: 6.2500 mg | INTRAMUSCULAR | Status: DC | PRN

## 2017-11-19 MED ORDER — SODIUM CHLORIDE 0.9 % IR SOLN
Status: DC | PRN
  Administered 2017-11-19 (×2): 3000 mL via INTRAVESICAL
  Administered 2017-11-19: 6000 mL via INTRAVESICAL
  Administered 2017-11-19: 500 mL via INTRAVESICAL
  Administered 2017-11-19 (×3): 6000 mL via INTRAVESICAL

## 2017-11-19 MED ORDER — ONDANSETRON HCL 4 MG PO TABS: 4.0000 mg | ORAL_TABLET | Freq: Every day | ORAL | 1 refills | Status: DC | PRN

## 2017-11-19 MED ORDER — LACTATED RINGERS IV SOLN
INTRAVENOUS | Status: DC
  Filled 2017-11-19: qty 1000

## 2017-11-19 MED ORDER — ONDANSETRON HCL 4 MG/2ML IJ SOLN: 4.0000 mg | INTRAMUSCULAR | Status: DC | PRN

## 2017-11-19 MED ORDER — LACTATED RINGERS IV SOLN
INTRAVENOUS | Status: DC
  Administered 2017-11-19: 14:00:00 via INTRAVENOUS
  Administered 2017-11-19: 1000 mL via INTRAVENOUS
  Administered 2017-11-19 (×2): via INTRAVENOUS
  Filled 2017-11-19: qty 1000

## 2017-11-19 MED ORDER — SODIUM CHLORIDE 0.9 % IV SOLN
INTRAVENOUS | Status: DC
  Administered 2017-11-19 – 2017-11-23 (×7): via INTRAVENOUS

## 2017-11-19 SURGICAL SUPPLY — 38 items
APL SKNCLS STERI-STRIP NONHPOA (GAUZE/BANDAGES/DRESSINGS)
BAG DRAIN URO-CYSTO SKYTR STRL (DRAIN) ×6 IMPLANT
BAG DRN UROCATH (DRAIN) ×2
BAG URINE DRAINAGE (UROLOGICAL SUPPLIES) ×2 IMPLANT
BASKET STONE 1.7 NGAGE (UROLOGICAL SUPPLIES) IMPLANT
BASKET ZERO TIP NITINOL 2.4FR (BASKET) ×4 IMPLANT
BENZOIN TINCTURE PRP APPL 2/3 (GAUZE/BANDAGES/DRESSINGS) IMPLANT
BSKT STON RTRVL ZERO TP 2.4FR (BASKET) ×2
CATH FOLEY 3WAY 30CC 22F (CATHETERS) ×2 IMPLANT
CATH URET 5FR 28IN OPEN ENDED (CATHETERS) ×2 IMPLANT
CLOSURE WOUND 1/2 X4 (GAUZE/BANDAGES/DRESSINGS)
CLOTH BEACON ORANGE TIMEOUT ST (SAFETY) ×4 IMPLANT
FIBER LASER FLEXIVA 365 (UROLOGICAL SUPPLIES) IMPLANT
FIBER LASER TRAC TIP (UROLOGICAL SUPPLIES) IMPLANT
GLOVE BIO SURGEON STRL SZ7.5 (GLOVE) ×4 IMPLANT
GLOVE BIOGEL PI IND STRL 7.5 (GLOVE) IMPLANT
GLOVE BIOGEL PI INDICATOR 7.5 (GLOVE) ×6
GOWN STRL REUS W/TWL XL LVL3 (GOWN DISPOSABLE) ×4 IMPLANT
GUIDEWIRE ANG ZIPWIRE 038X150 (WIRE) ×2 IMPLANT
GUIDEWIRE STR DUAL SENSOR (WIRE) IMPLANT
HOLDER FOLEY CATH W/STRAP (MISCELLANEOUS) ×2 IMPLANT
INFUSOR MANOMETER BAG 3000ML (MISCELLANEOUS) ×2 IMPLANT
IV NS 1000ML (IV SOLUTION)
IV NS 1000ML BAXH (IV SOLUTION) IMPLANT
IV NS IRRIG 3000ML ARTHROMATIC (IV SOLUTION) ×22 IMPLANT
KIT RM TURNOVER CYSTO AR (KITS) ×4 IMPLANT
LOOP CUT BIPOLAR 24F LRG (ELECTROSURGICAL) ×2 IMPLANT
MANIFOLD NEPTUNE II (INSTRUMENTS) ×4 IMPLANT
NS IRRIG 500ML POUR BTL (IV SOLUTION) ×6 IMPLANT
PACK CYSTO (CUSTOM PROCEDURE TRAY) ×4 IMPLANT
STENT URET 6FRX26 CONTOUR (STENTS) ×2 IMPLANT
STRIP CLOSURE SKIN 1/2X4 (GAUZE/BANDAGES/DRESSINGS) IMPLANT
SYRINGE 10CC LL (SYRINGE) ×4 IMPLANT
SYRINGE IRR TOOMEY STRL 70CC (SYRINGE) ×2 IMPLANT
TUBE CONNECTING 12'X1/4 (SUCTIONS) ×1
TUBE CONNECTING 12X1/4 (SUCTIONS) ×3 IMPLANT
WATER STERILE IRR 3000ML UROMA (IV SOLUTION) IMPLANT
WATER STERILE IRR 500ML POUR (IV SOLUTION) IMPLANT

## 2017-11-19 SURGICAL SUPPLY — 21 items
BAG URINE DRAINAGE (UROLOGICAL SUPPLIES) ×2 IMPLANT
BAG URO CATCHER STRL LF (MISCELLANEOUS) ×3 IMPLANT
CATH HEMA 3WAY 30CC 22FR COUDE (CATHETERS) IMPLANT
CATH HEMA 3WAY 30CC 24FR COUDE (CATHETERS) ×2 IMPLANT
COVER FOOTSWITCH UNIV (MISCELLANEOUS) ×2 IMPLANT
COVER SURGICAL LIGHT HANDLE (MISCELLANEOUS) ×1 IMPLANT
ELECT LOOP 22F BIPOLAR SML (ELECTROSURGICAL) ×3
ELECT REM PT RETURN 15FT ADLT (MISCELLANEOUS) IMPLANT
ELECTRODE LOOP 22F BIPOLAR SML (ELECTROSURGICAL) ×1 IMPLANT
EVACUATOR ELLICK (MISCELLANEOUS) ×3 IMPLANT
EVACUATOR MICROVAS BLADDER (UROLOGICAL SUPPLIES) ×2 IMPLANT
GLOVE BIO SURGEON STRL SZ7.5 (GLOVE) ×3 IMPLANT
GOWN STRL REUS W/TWL XL LVL3 (GOWN DISPOSABLE) ×6 IMPLANT
LOOP CUT BIPOLAR 24F LRG (ELECTROSURGICAL) IMPLANT
PACK CYSTO (CUSTOM PROCEDURE TRAY) ×3 IMPLANT
SYR 30ML LL (SYRINGE) ×2 IMPLANT
SYRINGE IRR TOOMEY STRL 70CC (SYRINGE) ×2 IMPLANT
TUBING CONNECTING 10 (TUBING) ×2 IMPLANT
TUBING CONNECTING 10' (TUBING) ×1
TUBING UROLOGY SET (TUBING) ×3 IMPLANT
WATER STERILE IRR 500ML POUR (IV SOLUTION) ×2 IMPLANT

## 2017-11-19 NOTE — Anesthesia Postprocedure Evaluation (Signed)
Anesthesia Post Note  Patient: Christian Kennedy  Procedure(s) Performed: CYSTOSCOPY WITH FULGERATION AND CLOT EVACUATION (N/A Bladder)     Patient location during evaluation: PACU Anesthesia Type: General Level of consciousness: awake and alert Pain management: pain level controlled Vital Signs Assessment: post-procedure vital signs reviewed and stable Respiratory status: spontaneous breathing, nonlabored ventilation, respiratory function stable and patient connected to nasal cannula oxygen Cardiovascular status: blood pressure returned to baseline and stable Postop Assessment: no apparent nausea or vomiting Anesthetic complications: no    Last Vitals:  Vitals:   11/19/17 2210 11/19/17 2220  BP:  (!) 142/80  Pulse:  92  Resp: 16   Temp: 36.9 C 36.8 C  SpO2:  100%    Last Pain:  Vitals:   11/19/17 2241  TempSrc:   PainSc: [place]

## 2017-11-19 NOTE — Discharge Instructions (Signed)
MAY TAKE IBUPROFEN AFTER 9:00 PM IF NEEDED   Post Anesthesia Home Care Instructions  Activity: Get plenty of rest for the remainder of the day. A responsible individual must stay with you for 24 hours following the procedure.  For the next 24 hours, DO NOT: -Drive a car -Paediatric nurse -Drink alcoholic beverages -Take any medication unless instructed by your physician -Make any legal decisions or sign important papers.  Meals: Start with liquid foods such as gelatin or soup. Progress to regular foods as tolerated. Avoid greasy, spicy, heavy foods. If nausea and/or vomiting occur, drink only clear liquids until the nausea and/or vomiting subsides. Call your physician if vomiting continues.  Special Instructions/Symptoms: Your throat may feel dry or sore from the anesthesia or the breathing tube placed in your throat during surgery. If this causes discomfort, gargle with warm salt water. The discomfort should disappear within 24 hours.  If you had a scopolamine patch placed behind your ear for the management of post- operative nausea and/or vomiting:  1. The medication in the patch is effective for 72 hours, after which it should be removed.  Wrap patch in a tissue and discard in the trash. Wash hands thoroughly with soap and water. 2. You may remove the patch earlier than 72 hours if you experience unpleasant side effects which may include dry mouth, dizziness or visual disturbances. 3. Avoid touching the patch. Wash your hands with soap and water after contact with the patch.

## 2017-11-19 NOTE — Anesthesia Procedure Notes (Signed)
Date/Time: 11/19/2017 9:04 PM Performed by: Cynda Familia, CRNA Pre-anesthesia Checklist: Patient identified, Emergency Drugs available, Suction available and Patient being monitored Oxygen Delivery Method: Simple face mask Placement Confirmation: breath sounds checked- equal and bilateral and positive ETCO2 Dental Injury: Teeth and Oropharynx as per pre-operative assessment

## 2017-11-19 NOTE — Progress Notes (Signed)
1615 trial run on stopping CBI's to make sure that it is clear before D/C ing them. Urine began to clot, thick blood in tubing. Dr. Lovena Neighbours called to inform of clotting urine. Instructed to open CBI's to flush bladder. 1655 Hand irrigated with Saline. Received large amts of clots from bladder. Kept CBI open. Still has lots of clots.1734. Called Dr Lovena Neighbours en route to evaluate  Pt. 1745. Dr Lovena Neighbours here hand irrigated not clearing up still has clots. Decided to take back to surgery.. Awaiting on surgery to call around 1930 continues with CBI

## 2017-11-19 NOTE — Anesthesia Preprocedure Evaluation (Signed)
Anesthesia Evaluation  Patient identified by MRN, date of birth, ID band Patient awake    Reviewed: Allergy & Precautions, NPO status , Patient's Chart, lab work & pertinent test results  Airway Mallampati: II  TM Distance: >3 FB Neck ROM: Full    Dental no notable dental hx.    Pulmonary neg pulmonary ROS, Current Smoker,    Pulmonary exam normal breath sounds clear to auscultation       Cardiovascular negative cardio ROS Normal cardiovascular exam Rhythm:Regular Rate:Normal     Neuro/Psych  Headaches, Depression negative neurological ROS  negative psych ROS   GI/Hepatic negative GI ROS, Neg liver ROS, GERD  ,  Endo/Other  negative endocrine ROS  Renal/GU negative Renal ROS  negative genitourinary   Musculoskeletal negative musculoskeletal ROS (+)   Abdominal   Peds negative pediatric ROS (+)  Hematology negative hematology ROS (+)   Anesthesia Other Findings   Reproductive/Obstetrics negative OB ROS                             Anesthesia Physical Anesthesia Plan  ASA: II  Anesthesia Plan: General   Post-op Pain Management:    Induction: Intravenous  PONV Risk Score and Plan: 1 and Ondansetron  Airway Management Planned: LMA  Additional Equipment:   Intra-op Plan:   Post-operative Plan: Extubation in OR  Informed Consent: I have reviewed the patients History and Physical, chart, labs and discussed the procedure including the risks, benefits and alternatives for the proposed anesthesia with the patient or authorized representative who has indicated his/her understanding and acceptance.   Dental advisory given  Plan Discussed with: CRNA  Anesthesia Plan Comments:         Anesthesia Quick Evaluation

## 2017-11-19 NOTE — Anesthesia Procedure Notes (Signed)
Procedure Name: LMA Insertion Date/Time: 11/19/2017 1:49 PM Performed by: Justice Rocher, CRNA Pre-anesthesia Checklist: Patient identified, Emergency Drugs available, Suction available and Patient being monitored Patient Re-evaluated:Patient Re-evaluated prior to induction Oxygen Delivery Method: Circle system utilized Preoxygenation: Pre-oxygenation with 100% oxygen Induction Type: IV induction Ventilation: Mask ventilation without difficulty LMA: LMA inserted LMA Size: 5.0 Number of attempts: 1 Airway Equipment and Method: Bite block Placement Confirmation: positive ETCO2 and breath sounds checked- equal and bilateral Tube secured with: Tape Dental Injury: Teeth and Oropharynx as per pre-operative assessment

## 2017-11-19 NOTE — Transfer of Care (Signed)
Immediate Anesthesia Transfer of Care Note  Patient: Jayshon E Heist  Procedure(s) Performed: Procedure(s) (LRB): CYSTOSCOPY WITH RETROGRADE PYELOGRAM/URETERAL STENT PLACEMENT (Right) TRANSURETHRAL RESECTION OF BLADDER TUMOR (TURBT) (N/A)  Patient Location: PACU  Anesthesia Type: General  Level of Consciousness: awake, sedated, patient cooperative and responds to stimulation  Airway & Oxygen Therapy: Patient Spontanous Breathing and Patient connected to [place] O2  Post-op Assessment: Report given to PACU RN, Post -op Vital signs reviewed and stable and Patient moving all extremities  Post vital signs: Reviewed and stable  Complications: No apparent anesthesia complications

## 2017-11-19 NOTE — Anesthesia Procedure Notes (Signed)
Procedure Name: Intubation Date/Time: 11/19/2017 8:03 PM Performed by: Cynda Familia, CRNA Pre-anesthesia Checklist: Patient identified, Emergency Drugs available, Suction available and Patient being monitored Patient Re-evaluated:Patient Re-evaluated prior to induction Oxygen Delivery Method: Circle System Utilized Preoxygenation: Pre-oxygenation with 100% oxygen Induction Type: IV induction Ventilation: Mask ventilation without difficulty Laryngoscope Size: Miller and 2 Grade View: Grade I Tube type: Oral Tube size: 7.5 mm Number of attempts: 1 Airway Equipment and Method: Stylet Placement Confirmation: ETT inserted through vocal cords under direct vision,  positive ETCO2 and breath sounds checked- equal and bilateral Secured at: 21 cm Tube secured with: Tape Dental Injury: Teeth and Oropharynx as per pre-operative assessment  Comments: Smooth IV induction [place]-- intubation AM CRNA atraumatic teeth and mouth as preop--- chipped broken teeth present prior to laryngoscopy-- unchanged--- bilat BS [place]

## 2017-11-19 NOTE — Telephone Encounter (Signed)
PA approved.

## 2017-11-19 NOTE — Transfer of Care (Signed)
Immediate Anesthesia Transfer of Care Note  Patient: Christian Kennedy  Procedure(s) Performed: CYSTOSCOPY WITH FULGERATION AND CLOT EVACUATION (N/A Bladder)  Patient Location: PACU  Anesthesia Type:General  Level of Consciousness: sedated  Airway & Oxygen Therapy: Patient Spontanous Breathing and Patient connected to face mask oxygen  Post-op Assessment: Report given to RN and Post -op Vital signs reviewed and stable  Post vital signs: Reviewed and stable  Last Vitals:  Vitals:   11/19/17 1900 11/19/17 1915  BP: (!) 142/79 122/70  Pulse: 77 78  Resp: 14 14  Temp:    SpO2: 97% 97%    Last Pain:  Vitals:   11/19/17 1900  TempSrc:   PainSc: 5       Patients Stated Pain Goal: 6 (76/70/11 0034)  Complications: No apparent anesthesia complications

## 2017-11-19 NOTE — Op Note (Signed)
Operative Note  Preoperative diagnosis:  1.  5 cm bladder tumor involving the right lateral wall and immediately abutting the right ureteral orifice  Postoperative diagnosis: 1.  5 cm bladder tumor involving the right lateral wall and immediately abutting the right ureteral orifice 2.  No evidence of right ureteral tumor involvement  Procedure(s): 1.  Cystoscopy  2.  Right retrograde pyelogram with intraoperative interpretation of fluoroscopic imaging 3.  TURBT large 4.  Right diagnostic ureteroscopy 5.  Right JJ stent placement  Surgeon: Ellison Hughs, MD  Assistants: None  Anesthesia: General LMA  Complications: None  EBL: Less than 5 mL  Specimens: 1.  Superficial and deep bladder tumor   Drains/Catheters: 1.  Right 6 French by 26 cm JJ stent 2.  56 French three-way Foley catheter with 20 mL and the balloon  Intraoperative findings:   1.  5 cm papillary tumor involving the right lateral bladder wall and immediately abutting the right ureteral orifice. 2.  Solitary right collecting system with a 1 cm filling defects/ureteral narrowing involving the right distal ureter.  No filling defects  or dilation involving the proximal right ureter or right renal pelvis seen on retrograde pyelogram  Indication:  Christian Kennedy is a 52 y.o. male  referred by Dayna Ramus, MD after he was found to have a large bladder lesion concerning for malignancy on recent CT abd/pel. He was evaluated in the ED in December 2018 for worsening right flank pain and gross hematuria. Today, he reports dysuria and intermittent right flank pain after voiding, but states that the hematuria has since resolved. He denies nausea/vomiting, fever/chills or interval UTIs.   Tobacco use- 1 ppd for ~30 years.  No personal/family history of GU malignancies.   Last PSA- 1.3 (08/2017)  Description of procedure:  After informed consent was obtained, the patient was brought to the operating room and  general LMA anesthesia was administered. The patient was then placed in the dorsolithotomy position and prepped and draped in usual sterile fashion. A timeout was performed. A 21 French rigid cystoscope was then inserted into the urethral meatus and advanced into the bladder under direct vision. A complete bladder survey revealed a 5 cm papillary bladder tumor immediately abutting the right ureteral orifice.  A 5 French open-ended catheter was then inserted into the right ureteral orifice and a retrograde pyelogram was obtained, with the findings listed above.  The rigid cystoscope was then exchanged for a 26 French resectoscope with a bipolar loop working element.  The bladder tumor was then systematically resected down to the detrusor musculature.  Superficial and deep tumor margins were sent to pathology as a separate specimen.  The tumor resection bed was then extensively fulgurated until hemostasis was.  The tumor was overlying the right ureter and immediately adjacent to the right ureteral orifice.  Electrocautery had to be used adjacent to the ureteral orifice as well as the overlying ureteral sheath to obtain hemostasis.    Given the filling defect seen in the right distal ureter, the decision was made to perform a right ureteroscopy to assure there was no tumor involvement within the ureteral lumen.  A semirigid ureteroscope was then advanced into the urethra and into the bladder.  A Glidewire was then placed in the right ureter and advanced up to the right renal pelvis, under fluoroscopic guidance.  The semirigid ureteroscope was then advanced over the wire and a full survey of the lumen of the right ureter revealed no evidence of tumor involvement.  The ureteroscope was then removed, leaving the wire in place.  A rigid cystoscope was then advanced over the wire and into the bladder.  A 6 [place] JJ stent was then placed over the wire and into position within the right collecting system, confirming  placement via fluoroscopy.  The cystoscope was then removed.  A 22 French three-way Foley catheter was then placed and extensively irrigated until the irrigant returned clear.  The patient tolerated the procedure well and was transferred to the postanesthesia unit in stable condition.  Plan: Follow-up in 1 week for a voiding trial and right JJ stent removal.

## 2017-11-19 NOTE — Progress Notes (Addendum)
Patient underwent TURBT with Dr. Lovena Neighbours earlier today. Post op, the catheter began to clot off and was unable to be temporized with CBI. There is concern for an arterial bleed. Plan for return to trip to OR for cystoscopy, fulguration of bleeders.

## 2017-11-19 NOTE — Op Note (Signed)
Date of procedure: 11/19/17  Preoperative diagnosis:  1. Gross hematuria 2. Bladder tumor 3. Clot retention  Postoperative diagnosis:  1. Same  Procedure: 1. Cystoscopy 2. Clot evacuation 3. Fulguration of bleeders  Surgeon: Baruch Gouty, MD  Anesthesia: General  Complications: None  Intraoperative findings: The patient underwent a transurethral resection of bladder tumor with a very large tumor earlier today.  He developed clot retention postoperatively presents for a second look to perform clot evacuation and fulguration of bleeders.  EBL: None  Specimens: None  Drains: 24 French hematuria catheter three-way on CBI  Disposition: Stable to the postanesthesia care unit  Indication for procedure: The patient is a 52 y.o. male with history of transurethral resection of the large bladder tumor earlier today.  He developed clot retention postoperatively.  He presents now for emergent clot evacuation fulguration of bleeders.  After reviewing the management options for treatment, the patient elected to proceed with the above surgical procedure(s). We have discussed the potential benefits and risks of the procedure, side effects of the proposed treatment, the likelihood of the patient achieving the goals of the procedure, and any potential problems that might occur during the procedure or recuperation. Informed consent has been obtained.  Description of procedure: The patient was met in the preoperative area. All risks, benefits, and indications of the procedure were described in great detail. The patient consented to the procedure. Preoperative antibiotics were given. The patient was taken to the operative theater. General anesthesia was induced per the anesthesia service. The patient was then placed in the dorsal lithotomy position and prepped and draped in the usual sterile fashion. A preoperative timeout was called.   A 24 French 30 degree resectoscope sheath with visual obturator  was inserted into the patient's bladder per urethra.  A large amount of clot amounting to approximately 8 ounces was evacuated.  The resection bed was then expected which showed a large extensive area.  There was a stent in place.  Most of the resection was lateral to the stent.  There is muscle fibers seen throughout.  There is also there is extensive fat.  There was what appeared to be a venous bleed in the superior portion of the resection site.  For approximately 1/2-hour attempts were made to try to localize where this actual ooze was coming from.  It was unable to be localized.  Due to the various areas of deeper resection, the decision at this point was made to abort further attempts at coagulation due to the thin bladder wall.  Also, it does appear to be venous not arterial in nature.  The resectoscope was withdrawn and a 24 [place] hematuria catheter was placed on maximal CBI with clear pink output.  The hope would be for that the bleeding begins to subside on its own as it appears to be venous in nature.  The patient was then awoke from anesthesia and transferred in stable condition on CBI.  Plan: The patient will be transferred to the floor on maximal CBI monitor overnight.  We will tentatively plan for a second look tomorrow.  Hopefully though in the interim his urine begins to clear.  Baruch Gouty, M.D.

## 2017-11-19 NOTE — Anesthesia Preprocedure Evaluation (Signed)
Anesthesia Evaluation  Patient identified by MRN, date of birth, ID band Patient awake    Reviewed: Allergy & Precautions, NPO status , Patient's Chart, lab work & pertinent test results  Airway Mallampati: I  TM Distance: >3 FB Neck ROM: Full    Dental no notable dental hx. (+) Chipped, Dental Advisory Given,    Pulmonary neg pulmonary ROS, Current Smoker,    Pulmonary exam normal breath sounds clear to auscultation       Cardiovascular + Peripheral Vascular Disease  Normal cardiovascular exam Rhythm:Regular Rate:Normal     Neuro/Psych  Headaches, PSYCHIATRIC DISORDERS Depression negative neurological ROS     GI/Hepatic negative GI ROS, Neg liver ROS, GERD  ,  Endo/Other  negative endocrine ROS  Renal/GU negative Renal ROS  negative genitourinary   Musculoskeletal negative musculoskeletal ROS (+)   Abdominal Normal abdominal exam  (+)   Peds negative pediatric ROS (+)  Hematology negative hematology ROS (+)   Anesthesia Other Findings   Reproductive/Obstetrics negative OB ROS                             Anesthesia Physical  Anesthesia Plan  ASA: II and emergent  Anesthesia Plan: General   Post-op Pain Management:    Induction: Intravenous, Rapid sequence and Cricoid pressure planned  PONV Risk Score and Plan: 2 and Ondansetron and Midazolam  Airway Management Planned: Oral ETT  Additional Equipment:   Intra-op Plan:   Post-operative Plan: Extubation in OR  Informed Consent: I have reviewed the patients History and Physical, chart, labs and discussed the procedure including the risks, benefits and alternatives for the proposed anesthesia with the patient or authorized representative who has indicated his/her understanding and acceptance.   Dental advisory given  Plan Discussed with: CRNA  Anesthesia Plan Comments:         Anesthesia Quick Evaluation

## 2017-11-20 ENCOUNTER — Inpatient Hospital Stay (HOSPITAL_COMMUNITY): Payer: BLUE CROSS/BLUE SHIELD | Admitting: Anesthesiology

## 2017-11-20 ENCOUNTER — Encounter (HOSPITAL_COMMUNITY): Payer: Self-pay | Admitting: Urology

## 2017-11-20 DIAGNOSIS — K5900 Constipation, unspecified: Secondary | ICD-10-CM | POA: Diagnosis not present

## 2017-11-20 DIAGNOSIS — Z818 Family history of other mental and behavioral disorders: Secondary | ICD-10-CM | POA: Diagnosis not present

## 2017-11-20 DIAGNOSIS — K219 Gastro-esophageal reflux disease without esophagitis: Secondary | ICD-10-CM | POA: Diagnosis present

## 2017-11-20 DIAGNOSIS — N3289 Other specified disorders of bladder: Secondary | ICD-10-CM | POA: Diagnosis not present

## 2017-11-20 DIAGNOSIS — R310 Gross hematuria: Secondary | ICD-10-CM | POA: Diagnosis not present

## 2017-11-20 DIAGNOSIS — D494 Neoplasm of unspecified behavior of bladder: Secondary | ICD-10-CM | POA: Diagnosis not present

## 2017-11-20 DIAGNOSIS — Z6833 Body mass index (BMI) 33.0-33.9, adult: Secondary | ICD-10-CM | POA: Diagnosis not present

## 2017-11-20 DIAGNOSIS — F17210 Nicotine dependence, cigarettes, uncomplicated: Secondary | ICD-10-CM | POA: Diagnosis present

## 2017-11-20 DIAGNOSIS — C679 Malignant neoplasm of bladder, unspecified: Secondary | ICD-10-CM | POA: Diagnosis present

## 2017-11-20 DIAGNOSIS — F329 Major depressive disorder, single episode, unspecified: Secondary | ICD-10-CM | POA: Diagnosis present

## 2017-11-20 DIAGNOSIS — D62 Acute posthemorrhagic anemia: Secondary | ICD-10-CM | POA: Diagnosis not present

## 2017-11-20 DIAGNOSIS — E669 Obesity, unspecified: Secondary | ICD-10-CM | POA: Diagnosis present

## 2017-11-20 DIAGNOSIS — R319 Hematuria, unspecified: Secondary | ICD-10-CM | POA: Diagnosis present

## 2017-11-20 DIAGNOSIS — I739 Peripheral vascular disease, unspecified: Secondary | ICD-10-CM | POA: Diagnosis present

## 2017-11-20 LAB — BASIC METABOLIC PANEL
ANION GAP: 10 (ref 5–15)
BUN: 17 mg/dL (ref 6–20)
CO2: 22 mmol/L (ref 22–32)
Calcium: 8.5 mg/dL — ABNORMAL LOW (ref 8.9–10.3)
Chloride: 106 mmol/L (ref 101–111)
Creatinine, Ser: 0.87 mg/dL (ref 0.61–1.24)
GFR calc Af Amer: >60 mL/min (ref >60)
GLUCOSE: 146 mg/dL — AB (ref 65–99)
Potassium: 4.4 mmol/L (ref 3.5–5.1)
Sodium: 138 mmol/L (ref 135–145)

## 2017-11-20 LAB — CBC
HCT: 34.1 % — ABNORMAL LOW (ref 39.0–52.0)
HEMOGLOBIN: 11.4 g/dL — AB (ref 13.0–17.0)
MCH: 30.8 pg (ref 26.0–34.0)
MCHC: 33.4 g/dL (ref 30.0–36.0)
MCV: 92.2 fL (ref 78.0–100.0)
Platelets: 280 10*3/uL (ref 150–400)
RBC: 3.7 MIL/uL — ABNORMAL LOW (ref 4.22–5.81)
RDW: 13.5 % (ref 11.5–15.5)
WBC: 19.2 10*3/uL — ABNORMAL HIGH (ref 4.0–10.5)

## 2017-11-20 LAB — APTT: aPTT: 32 seconds (ref 24–36)

## 2017-11-20 LAB — POCT I-STAT 4, (NA,K, GLUC, HGB,HCT)
Glucose, Bld: 126 mg/dL — ABNORMAL HIGH (ref 65–99)
HCT: 41 % (ref 39.0–52.0)
HEMOGLOBIN: 13.9 g/dL (ref 13.0–17.0)
POTASSIUM: 4.5 mmol/L (ref 3.5–5.1)
Sodium: 140 mmol/L (ref 135–145)

## 2017-11-20 LAB — PROTIME-INR
INR: 1.02
PROTHROMBIN TIME: 13.3 s (ref 11.4–15.2)

## 2017-11-20 LAB — ABO/RH: ABO/RH(D): B POS

## 2017-11-20 MED ORDER — BELLADONNA ALKALOIDS-OPIUM 16.2-60 MG RE SUPP
1.0000 | Freq: Four times a day (QID) | RECTAL | Status: DC | PRN
  Administered 2017-11-20 – 2017-11-21 (×2): 1 via RECTAL
  Filled 2017-11-20 (×4): qty 1

## 2017-11-20 MED ORDER — FAMOTIDINE 20 MG PO TABS
10.0000 mg | ORAL_TABLET | Freq: Every day | ORAL | Status: DC
  Administered 2017-11-20 – 2017-11-23 (×4): 10 mg via ORAL
  Filled 2017-11-20 (×3): qty 1

## 2017-11-20 NOTE — Progress Notes (Signed)
No events overnight No n/v/f/c/sp pain Slight drop in Hg as expected CBI clear  Vitals:   11/19/17 2220 11/20/17 0010 11/20/17 0154 11/20/17 0641  BP: (!) 142/80 127/80 126/83 108/76  Pulse: 92 (!) 106 96 99  Resp:  16 16 16   Temp: 98.2 F (36.8 C) 98.9 F (37.2 C) 98.1 F (36.7 C) 98.4 F (36.9 C)  TempSrc:  Oral Oral Oral  SpO2: 100% 99% 99% 98%  Weight: 267 lb 6.7 oz (121.3 kg)     Height: 6\' 3"  (1.905 m)      CBI  NAD Soft nt nd No clots able to be irrigated from foley Clear to light pink on fast cbi. Pink lemonade on medium cbi  BMP Latest Ref Rng & Units 11/20/2017 11/19/2017 09/15/2017  Glucose 65 - 99 mg/dL 146(H) 126(H) 88  BUN 6 - 20 mg/dL 17 - 13  Creatinine 0.61 - 1.24 mg/dL 0.87 - 0.74  Sodium 135 - 145 mmol/L 138 140 139  Potassium 3.5 - 5.1 mmol/L 4.4 4.5 4.5  Chloride 101 - 111 mmol/L 106 - 105  CO2 22 - 32 mmol/L 22 - 28  Calcium 8.9 - 10.3 mg/dL 8.5(L) - 9.4   CBC    Component Value Date/Time   WBC 19.2 (H) 11/20/2017 0505   RBC 3.70 (L) 11/20/2017 0505   HGB 11.4 (L) 11/20/2017 0505   HCT 34.1 (L) 11/20/2017 0505   PLT 280 11/20/2017 0505   MCV 92.2 11/20/2017 0505   MCH 30.8 11/20/2017 0505   MCHC 33.4 11/20/2017 0505   RDW 13.5 11/20/2017 0505   LYMPHSABS 2.4 09/15/2017 1500   MONOABS 0.5 09/15/2017 1500   EOSABS 0.1 09/15/2017 1500   BASOSABS 0.0 09/15/2017 1500   POD 1 TURBT followed by clot evac/fulguration of bleeders -Improving. CBI much improved compared to prior to second look. Will hold off on OR today. Will wean CBI over the course of the day. Will hold an OR spot for the patient tomorrow morning in the event he worsens or fails to continue to improve.  -Repeat CBC/BMP in AM

## 2017-11-20 NOTE — Anesthesia Preprocedure Evaluation (Signed)
Anesthesia Evaluation  Patient identified by MRN, date of birth, ID band Patient awake    Reviewed: Allergy & Precautions, NPO status , Patient's Chart, lab work & pertinent test results  Airway Mallampati: I  TM Distance: >3 FB Neck ROM: Full    Dental no notable dental hx. (+) Chipped, Dental Advisory Given,    Pulmonary Current Smoker,    Pulmonary exam normal breath sounds clear to auscultation       Cardiovascular + Peripheral Vascular Disease  Normal cardiovascular exam Rhythm:Regular Rate:Normal     Neuro/Psych  Headaches, PSYCHIATRIC DISORDERS Depression    GI/Hepatic negative GI ROS, Neg liver ROS, GERD  ,  Endo/Other  Obesity  Renal/GU negative Renal ROS  negative genitourinary   Musculoskeletal negative musculoskeletal ROS (+)   Abdominal Normal abdominal exam  (+)   Peds negative pediatric ROS (+)  Hematology negative hematology ROS (+)   Anesthesia Other Findings   Reproductive/Obstetrics negative OB ROS                             Anesthesia Physical  Anesthesia Plan  ASA: II  Anesthesia Plan: General   Post-op Pain Management:    Induction: Intravenous  PONV Risk Score and Plan: 3 and Ondansetron, Midazolam, Treatment may vary due to age or medical condition and Dexamethasone  Airway Management Planned: LMA  Additional Equipment: None  Intra-op Plan:   Post-operative Plan: Extubation in OR  Informed Consent: I have reviewed the patients History and Physical, chart, labs and discussed the procedure including the risks, benefits and alternatives for the proposed anesthesia with the patient or authorized representative who has indicated his/her understanding and acceptance.   Dental advisory given  Plan Discussed with: CRNA  Anesthesia Plan Comments:         Anesthesia Quick Evaluation

## 2017-11-21 ENCOUNTER — Encounter (HOSPITAL_COMMUNITY): Payer: Self-pay | Admitting: Registered Nurse

## 2017-11-21 ENCOUNTER — Encounter (HOSPITAL_COMMUNITY)
Admission: RE | Disposition: A | Discharge status: Home / Self Care | Payer: Self-pay | Source: Ambulatory Visit | Attending: Urology

## 2017-11-21 LAB — CBC
HEMATOCRIT: 28.4 % — AB (ref 39.0–52.0)
HEMOGLOBIN: 9.4 g/dL — AB (ref 13.0–17.0)
MCH: 30.9 pg (ref 26.0–34.0)
MCHC: 33.1 g/dL (ref 30.0–36.0)
MCV: 93.4 fL (ref 78.0–100.0)
Platelets: 215 10*3/uL (ref 150–400)
RBC: 3.04 MIL/uL — ABNORMAL LOW (ref 4.22–5.81)
RDW: 13.9 % (ref 11.5–15.5)
WBC: 12.5 10*3/uL — AB (ref 4.0–10.5)

## 2017-11-21 LAB — BASIC METABOLIC PANEL
ANION GAP: 10 (ref 5–15)
BUN: 15 mg/dL (ref 6–20)
CHLORIDE: 105 mmol/L (ref 101–111)
CO2: 22 mmol/L (ref 22–32)
Calcium: 8.2 mg/dL — ABNORMAL LOW (ref 8.9–10.3)
Creatinine, Ser: 0.81 mg/dL (ref 0.61–1.24)
GFR calc non Af Amer: >60 mL/min (ref >60)
GLUCOSE: 113 mg/dL — AB (ref 65–99)
POTASSIUM: 3.8 mmol/L (ref 3.5–5.1)
Sodium: 137 mmol/L (ref 135–145)

## 2017-11-21 SURGERY — CYSTOSCOPY, WITH BLADDER FULGURATION
Anesthesia: General

## 2017-11-21 NOTE — Progress Notes (Addendum)
No events overnight No n/v/f/c/sp pain Slight drop in Hg overnight from 11 to 9 CBI is at minimal drip with light pink to yellow urine   Vitals:   11/20/17 0641 11/20/17 1319 11/20/17 2116 11/21/17 0700  BP: 108/76 115/72 119/66 (!) 105/58  Pulse: 99 75 98 96  Resp: 16 16 16 18   Temp: 98.4 F (36.9 C) 98.1 F (36.7 C) 98.5 F (36.9 C) 98.3 F (36.8 C)  TempSrc: Oral Oral Oral Oral  SpO2: 98% 100% 96% 96%  Weight:      Height:       CBI  NAD Soft nt nd No clots able to be irrigated from foley CBI is at minimal drip with light pink to yellow urine   BMP Latest Ref Rng & Units 11/21/2017 11/20/2017 11/19/2017  Glucose 65 - 99 mg/dL 113(H) 146(H) 126(H)  BUN 6 - 20 mg/dL 15 17 -  Creatinine 0.61 - 1.24 mg/dL 0.81 0.87 -  Sodium 135 - 145 mmol/L 137 138 140  Potassium 3.5 - 5.1 mmol/L 3.8 4.4 4.5  Chloride 101 - 111 mmol/L 105 106 -  CO2 22 - 32 mmol/L 22 22 -  Calcium 8.9 - 10.3 mg/dL 8.2(L) 8.5(L) -   CBC    Component Value Date/Time   WBC 12.5 (H) 11/21/2017 0519   RBC 3.04 (L) 11/21/2017 0519   HGB 9.4 (L) 11/21/2017 0519   HCT 28.4 (L) 11/21/2017 0519   PLT 215 11/21/2017 0519   MCV 93.4 11/21/2017 0519   MCH 30.9 11/21/2017 0519   MCHC 33.1 11/21/2017 0519   RDW 13.9 11/21/2017 0519   LYMPHSABS 2.4 09/15/2017 1500   MONOABS 0.5 09/15/2017 1500   EOSABS 0.1 09/15/2017 1500   BASOSABS 0.0 09/15/2017 1500   POD 2 TURBT followed by clot evac/fulguration of bleeders -Significantly improved overnight. CBI is minimal with clear pink-yellow output. Will terminally wean CBI through remainder of day. Likely d/c home tomorrow AM with catheter.  -Repeat Hg in AM

## 2017-11-21 NOTE — Plan of Care (Signed)
  Education: Knowledge of the prescribed therapeutic regimen will improve 11/21/2017 2203 - Progressing by Ashley Murrain, RN   Activity: Risk for activity intolerance will decrease 11/21/2017 2203 - Progressing by Ashley Murrain, RN

## 2017-11-21 NOTE — Anesthesia Postprocedure Evaluation (Signed)
Anesthesia Post Note  Patient: Christian Kennedy  Procedure(s) Performed: CYSTOSCOPY WITH RETROGRADE PYELOGRAM/URETERAL STENT PLACEMENT (Right Ureter) TRANSURETHRAL RESECTION OF BLADDER TUMOR (TURBT) (N/A Bladder)     Patient location during evaluation: PACU Anesthesia Type: General Level of consciousness: awake and alert Pain management: pain level controlled Vital Signs Assessment: post-procedure vital signs reviewed and stable Respiratory status: spontaneous breathing, nonlabored ventilation, respiratory function stable and patient connected to nasal cannula oxygen Cardiovascular status: blood pressure returned to baseline and stable Postop Assessment: no apparent nausea or vomiting Anesthetic complications: no    Last Vitals:  Vitals:   11/21/17 0700 11/21/17 1425  BP: (!) 105/58 114/72  Pulse: 96 84  Resp: 18 18  Temp: 36.8 C 36.9 C  SpO2: 96% 96%    Last Pain:  Vitals:   11/21/17 1720  TempSrc:   PainSc: 0-No pain                 Lynda Rainwater

## 2017-11-21 NOTE — Progress Notes (Signed)
Patient ambulated on hall way at this time, tolerated well. CBI conts, tolerating well. Did not require any flushing during this shift.

## 2017-11-22 ENCOUNTER — Encounter (HOSPITAL_BASED_OUTPATIENT_CLINIC_OR_DEPARTMENT_OTHER): Payer: Self-pay | Admitting: Urology

## 2017-11-22 LAB — HEMOGLOBIN AND HEMATOCRIT, BLOOD
HCT: 24 % — ABNORMAL LOW (ref 39.0–52.0)
Hemoglobin: 8.1 g/dL — ABNORMAL LOW (ref 13.0–17.0)

## 2017-11-22 MED ORDER — SODIUM CHLORIDE 0.9 % IR SOLN
3000.0000 mL | Status: DC
  Administered 2017-11-22: 3000 mL

## 2017-11-22 MED ORDER — ALPRAZOLAM 0.5 MG PO TABS
0.5000 mg | ORAL_TABLET | Freq: Once | ORAL | Status: AC
  Administered 2017-11-22: 0.5 mg via ORAL
  Filled 2017-11-22: qty 1

## 2017-11-22 MED ORDER — BISACODYL 10 MG RE SUPP
10.0000 mg | Freq: Every day | RECTAL | Status: DC | PRN
  Administered 2017-11-22: 10 mg via RECTAL
  Filled 2017-11-22: qty 1

## 2017-11-22 NOTE — Progress Notes (Signed)
3 Days Post-Op Subjective: No acute events overnight.  Urine is clear to light pink on minimal CBI this morning.  His H/H dropped slightly over the past 24 hours.  Vital signs of all been stable.  He seems very down about his current situation and is concerned about how the bladder cancer is to be treated.  No bowel movement since Friday.  Objective: Vital signs in last 24 hours: Temp:  [97.8 F (36.6 C)-99.4 F (37.4 C)] 97.8 F (36.6 C) (02/25 0646) Pulse Rate:  [81-97] 97 (02/25 0646) Resp:  [18-20] 20 (02/25 0646) BP: (113-141)/(62-75) 141/75 (02/25 0646) SpO2:  [95 %-97 %] 97 % (02/25 0646)  Intake/Output from previous day: 02/24 0701 - 02/25 0700 In: 21570 [P.O.:1440; I.V.:5630; IV Piggyback:100] Out: 86754 [Urine:19250]  Intake/Output this shift: No intake/output data recorded.  Physical Exam:  General: Alert and oriented CV: RRR, palpable distal pulses Lungs: CTAB, equal chest rise Abdomen: Soft, NTND, no rebound or guarding GU: 22 French three-way Foley catheter in place and draining clear to light pink urine on minimal CBI Ext: NT, No erythema  Lab Results: Recent Labs    11/20/17 0505 11/21/17 0519 11/22/17 0505  HGB 11.4* 9.4* 8.1*  HCT 34.1* 28.4* 24.0*   BMET Recent Labs    11/20/17 0505 11/21/17 0519  NA 138 137  K 4.4 3.8  CL 106 105  CO2 22 22  GLUCOSE 146* 113*  BUN 17 15  CREATININE 0.87 0.81  CALCIUM 8.5* 8.2*     Studies/Results: No results found.  Assessment/Plan: 1.  Postop day 3 status post large TURBT and right JJ stent placement for suspected urothelial carcinoma. Gross hematuria postoperatively 2.  Blood loss anemia 3.  Constipation   -CBI as needed. -Encouraged ambulation. -Dulcolax suppository ordered -Hopefully discharge home later today if his urine remains clear   LOS: 2 days   Ellison Hughs, MD 11/22/2017, 7:34 AM  [facility] Pager: [phone]

## 2017-11-23 LAB — HEMOGLOBIN AND HEMATOCRIT, BLOOD
HEMATOCRIT: 22.5 % — AB (ref 39.0–52.0)
Hemoglobin: 7.6 g/dL — ABNORMAL LOW (ref 13.0–17.0)

## 2017-11-23 NOTE — Discharge Summary (Signed)
Date of admission: 11/19/2017  Date of discharge: 11/23/2017  Admission diagnosis:  1.  Bladder tumor 2.  Gross hematuria  Discharge diagnosis:  1.  High-grade, muscle invasive urothelial carcinoma of the bladder 2.  Gross hematuria with acute blood loss anemia following TURBT   History and Physical: For full details, please see admission history and physical. Briefly, Christian Kennedy is a 52 y.o. year old patient with a large bladder tumor involving the right lateral wall of the bladder.  He underwent TURBT and right JJ stent placement on 11/19/2017.   Hospital Course: While in the postanesthesia unit, the patient went into clot urinary retention and required a second cystoscopy for clot evacuation and fulguration of the resection bed of his tumor.  He was monitored on the floor postoperatively and had continuous bladder irrigation through his Foley catheter with progressive improvement every day.  He did have acute blood loss anemia following his initial surgery that is since stabilized.  His urine on 11/23/2017 is clear to light pink with no CBI.   Laboratory values:  Recent Labs    11/21/17 0519 11/22/17 0505 11/23/17 0748  HGB 9.4* 8.1* 7.6*  HCT 28.4* 24.0* 22.5*   Recent Labs    11/21/17 0519  CREATININE 0.81    Disposition: Home.  He will follow-up in my office on 11/26/2017 for a voiding trial and right JJ stent  Discharge instruction: The patient was instructed to be ambulatory but told to refrain from heavy lifting, strenuous activity, or driving.  The patient has been instructed to flush his Foley catheter with 60-120 mL of saline as needed for decreased catheter output and/or blood clots.  Discharge medications:  Allergies as of 11/23/2017   No Known Allergies     Medication List    TAKE these medications   amoxicillin 500 MG capsule Commonly known as:  AMOXIL Take 2 capsules (1,000 mg total) by mouth 2 (two) times daily.   famotidine 40 MG tablet Commonly  known as:  PEPCID Take 1 tablet (40 mg total) by mouth 2 (two) times daily.   HYDROcodone-acetaminophen 5-325 MG tablet Commonly known as:  NORCO Take 1 tablet by mouth every 4 (four) hours as needed for moderate pain.   milk thistle 175 MG tablet Take 175 mg by mouth daily.   ondansetron 4 MG tablet Commonly known as:  ZOFRAN Take 1 tablet (4 mg total) by mouth daily as needed for nausea or vomiting.   oxybutynin 10 MG 24 hr tablet Commonly known as:  DITROPAN XL Take 1 tablet (10 mg total) by mouth daily.   phenazopyridine 200 MG tablet Commonly known as:  PYRIDIUM Take 1 tablet (200 mg total) by mouth 3 (three) times daily as needed for pain.   SUPER B COMPLEX PO Take by mouth.   Vitamin D3 2000 units capsule Take 1 capsule (2,000 Units total) by mouth daily.     ASK your doctor about these medications   ciprofloxacin 500 MG tablet Commonly known as:  CIPRO Take 1 tablet (500 mg total) by mouth 2 (two) times daily for 3 days. Ask about: Should I take this medication?       Followup:  Follow-up Information    Ceasar Mons, MD In 1 week.   Specialty:  Urology Contact information: [place] [address] [address] [phone]

## 2017-11-24 ENCOUNTER — Telehealth: Payer: Self-pay | Admitting: *Deleted

## 2017-11-24 NOTE — Telephone Encounter (Signed)
Pt was on TCM report admitted 11/19/17 for Bladder tumor w/ Gross hematuria. He underwent TURBT and right JJ stent placement on 11/19/2017.  While in the postanesthesia unit, the patient went into clot urinary retention and required a second cystoscopy for clot evacuation and fulguration of the resection bed of his tumor.  He was monitored on the floor postoperatively and had continuous bladder irrigation through his Foley catheter with progressive improvement every day. Pt D/C 11/23/17 and will be f/u w/ urologist Dr. Lovena Neighbours on 11/26/2017.Christian KitchenJohny Kennedy

## 2017-11-26 DIAGNOSIS — C678 Malignant neoplasm of overlapping sites of bladder: Secondary | ICD-10-CM | POA: Diagnosis not present

## 2017-12-24 ENCOUNTER — Ambulatory Visit: Payer: BLUE CROSS/BLUE SHIELD | Admitting: Internal Medicine

## 2018-01-12 ENCOUNTER — Ambulatory Visit: Payer: BLUE CROSS/BLUE SHIELD | Admitting: Internal Medicine

## 2018-07-21 ENCOUNTER — Other Ambulatory Visit (INDEPENDENT_AMBULATORY_CARE_PROVIDER_SITE_OTHER): Payer: BLUE CROSS/BLUE SHIELD

## 2018-07-21 ENCOUNTER — Encounter: Payer: Self-pay | Admitting: Internal Medicine

## 2018-07-21 ENCOUNTER — Ambulatory Visit: Payer: BLUE CROSS/BLUE SHIELD | Admitting: Internal Medicine

## 2018-07-21 VITALS — BP 124/68 | HR 80 | Temp 98.5°F | Ht 75.0 in | Wt 268.0 lb

## 2018-07-21 DIAGNOSIS — R1031 Right lower quadrant pain: Secondary | ICD-10-CM | POA: Diagnosis not present

## 2018-07-21 DIAGNOSIS — C689 Malignant neoplasm of urinary organ, unspecified: Secondary | ICD-10-CM | POA: Diagnosis not present

## 2018-07-21 LAB — URINALYSIS
Bilirubin Urine: NEGATIVE
Hgb urine dipstick: NEGATIVE
Ketones, ur: NEGATIVE
LEUKOCYTES UA: NEGATIVE
NITRITE: NEGATIVE
Specific Gravity, Urine: 1.015 (ref 1.000–1.030)
Total Protein, Urine: NEGATIVE
UROBILINOGEN UA: 0.2 (ref 0.0–1.0)
Urine Glucose: NEGATIVE
pH: 7 (ref 5.0–8.0)

## 2018-07-21 LAB — CBC WITH DIFFERENTIAL/PLATELET
BASOS ABS: 0 10*3/uL (ref 0.0–0.1)
Basophils Relative: 0.6 % (ref 0.0–3.0)
EOS ABS: 0.1 10*3/uL (ref 0.0–0.7)
Eosinophils Relative: 2.1 % (ref 0.0–5.0)
HEMATOCRIT: 46.3 % (ref 39.0–52.0)
Hemoglobin: 16 g/dL (ref 13.0–17.0)
Lymphocytes Relative: 33 % (ref 12.0–46.0)
Lymphs Abs: 2.3 10*3/uL (ref 0.7–4.0)
MCHC: 34.5 g/dL (ref 30.0–36.0)
MCV: 90.3 fl (ref 78.0–100.0)
MONO ABS: 0.5 10*3/uL (ref 0.1–1.0)
Monocytes Relative: 7 % (ref 3.0–12.0)
NEUTROS ABS: 3.9 10*3/uL (ref 1.4–7.7)
NEUTROS PCT: 57.3 % (ref 43.0–77.0)
PLATELETS: 188 10*3/uL (ref 150.0–400.0)
RBC: 5.13 Mil/uL (ref 4.22–5.81)
RDW: 13.7 % (ref 11.5–15.5)
WBC: 6.9 10*3/uL (ref 4.0–10.5)

## 2018-07-21 LAB — BASIC METABOLIC PANEL
BUN: 13 mg/dL (ref 6–23)
CHLORIDE: 106 meq/L (ref 96–112)
CO2: 25 mEq/L (ref 19–32)
Calcium: 9.6 mg/dL (ref 8.4–10.5)
Creatinine, Ser: 0.84 mg/dL (ref 0.40–1.50)
GFR: 101.46 mL/min (ref >60.00)
Glucose, Bld: 92 mg/dL (ref 70–99)
POTASSIUM: 4.1 meq/L (ref 3.5–5.1)
SODIUM: 139 meq/L (ref 135–145)

## 2018-07-21 LAB — HEPATIC FUNCTION PANEL
ALT: 11 U/L (ref 0–53)
AST: 8 U/L (ref 0–37)
Albumin: 4.5 g/dL (ref 3.5–5.2)
Alkaline Phosphatase: 60 U/L (ref 39–117)
BILIRUBIN DIRECT: 0.1 mg/dL (ref 0.0–0.3)
TOTAL PROTEIN: 7.1 g/dL (ref 6.0–8.3)
Total Bilirubin: 0.5 mg/dL (ref 0.2–1.2)

## 2018-07-21 LAB — SEDIMENTATION RATE: Sed Rate: 7 mm/hr (ref 0–20)

## 2018-07-21 LAB — LIPASE: Lipase: 183 U/L — ABNORMAL HIGH (ref 11.0–59.0)

## 2018-07-21 MED ORDER — ONDANSETRON HCL 4 MG PO TABS: 4.0000 mg | ORAL_TABLET | Freq: Every day | ORAL | 1 refills | Status: DC | PRN

## 2018-07-21 MED ORDER — RABEPRAZOLE SODIUM 20 MG PO TBEC: 20.0000 mg | DELAYED_RELEASE_TABLET | Freq: Every day | ORAL | 11 refills | Status: DC

## 2018-07-21 MED ORDER — TRAMADOL HCL 50 MG PO TABS: 50.0000 mg | ORAL_TABLET | Freq: Four times a day (QID) | ORAL | 0 refills | Status: DC | PRN

## 2018-07-21 NOTE — Assessment & Plan Note (Signed)
CT abd

## 2018-07-21 NOTE — Progress Notes (Signed)
Subjective:  Patient ID: Christian Kennedy, male    DOB: 11-09-1965  Age: 52 y.o. MRN: [id number]  CC: No chief complaint on file.   HPI Decker E Steinhilber presents for n/v x 2-3 weeks ago after eating at [employer]. Same 1 wks ago after eating at the same restaurant. C/o R abd pain and back pain below shoulder blades. Had chills  Outpatient Medications Prior to Visit  Medication Sig Dispense Refill  . B Complex-C (SUPER B COMPLEX PO) Take by mouth.    . Cholecalciferol (VITAMIN D3) 2000 units capsule Take 1 capsule (2,000 Units total) by mouth daily. 100 capsule 3  . famotidine (PEPCID) 40 MG tablet Take 1 tablet (40 mg total) by mouth 2 (two) times daily. 180 tablet 3  . HYDROcodone-acetaminophen (NORCO) 5-325 MG tablet Take 1 tablet by mouth every 4 (four) hours as needed for moderate pain. 20 tablet 0  . milk thistle 175 MG tablet Take 175 mg by mouth daily.    . ondansetron (ZOFRAN) 4 MG tablet Take 1 tablet (4 mg total) by mouth daily as needed for nausea or vomiting. 30 tablet 1  . oxybutynin (DITROPAN XL) 10 MG 24 hr tablet Take 1 tablet (10 mg total) by mouth daily. 30 tablet 0  . phenazopyridine (PYRIDIUM) 200 MG tablet Take 1 tablet (200 mg total) by mouth 3 (three) times daily as needed for pain. 30 tablet 0  . amoxicillin (AMOXIL) 500 MG capsule Take 2 capsules (1,000 mg total) by mouth 2 (two) times daily. (Patient not taking: Reported on 11/19/2017) 40 capsule 0   No facility-administered medications prior to visit.     ROS: Review of Systems  Constitutional: Positive for chills. Negative for appetite change, fatigue and unexpected weight change.  HENT: Negative for congestion, nosebleeds, sneezing, sore throat and trouble swallowing.   Eyes: Negative for itching and visual disturbance.  Respiratory: Negative for cough.   Cardiovascular: Negative for chest pain, palpitations and leg swelling.  Gastrointestinal: Positive for abdominal pain, nausea and vomiting.  Negative for abdominal distention, blood in stool and diarrhea.  Genitourinary: Negative for frequency and hematuria.  Musculoskeletal: Negative for back pain, gait problem, joint swelling and neck pain.  Skin: Negative for rash.  Neurological: Negative for dizziness, tremors, speech difficulty and weakness.  Psychiatric/Behavioral: Negative for agitation, dysphoric mood and sleep disturbance. The patient is not nervous/anxious.     Objective:  BP 124/68 (BP Location: Left Arm, Patient Position: Sitting, Cuff Size: Large)   Pulse 80   Temp 98.5 F (36.9 C) (Oral)   Ht 6\' 3"  (1.905 m)   Wt 268 lb (121.6 kg)   SpO2 99%   BMI 33.50 kg/m   BP Readings from Last 3 Encounters:  07/21/18 124/68  11/23/17 118/81  10/26/17 128/82    Wt Readings from Last 3 Encounters:  07/21/18 268 lb (121.6 kg)  11/19/17 267 lb 6.7 oz (121.3 kg)  10/26/17 275 lb (124.7 kg)    Physical Exam  Constitutional: He is oriented to person, place, and time. He appears well-developed. No distress.  NAD  HENT:  Mouth/Throat: Oropharynx is clear and moist.  Eyes: Pupils are equal, round, and reactive to light. Conjunctivae are normal.  Neck: Normal range of motion. No JVD present. No thyromegaly present.  Cardiovascular: Normal rate, regular rhythm, normal heart sounds and intact distal pulses. Exam reveals no gallop and no friction rub.  No murmur heard. Pulmonary/Chest: Effort normal and breath sounds normal. No respiratory distress. He  has no wheezes. He has no rales. He exhibits no tenderness.  Abdominal: Soft. Bowel sounds are normal. He exhibits no distension and no mass. There is no tenderness. There is no rebound and no guarding.  Musculoskeletal: Normal range of motion. He exhibits no edema or tenderness.  Lymphadenopathy:    He has no cervical adenopathy.  Neurological: He is alert and oriented to person, place, and time. He has normal reflexes. No cranial nerve deficit. He exhibits normal muscle  tone. He displays a negative Romberg sign. Coordination and gait normal.  Skin: Skin is warm and dry. No rash noted.  Psychiatric: He has a normal mood and affect. His behavior is normal. Judgment and thought content normal.  RUQ sensitive  Lab Results  Component Value Date   WBC 12.5 (H) 11/21/2017   HGB 7.6 (L) 11/23/2017   HCT 22.5 (L) 11/23/2017   PLT 215 11/21/2017   GLUCOSE 113 (H) 11/21/2017   CHOL 194 09/15/2017   TRIG 237.0 (H) 09/15/2017   HDL 36.10 (L) 09/15/2017   LDLDIRECT 148.0 09/15/2017   ALT 17 09/15/2017   AST 11 09/15/2017   NA 137 11/21/2017   K 3.8 11/21/2017   CL 105 11/21/2017   CREATININE 0.81 11/21/2017   BUN 15 11/21/2017   CO2 22 11/21/2017   TSH 0.61 09/15/2017   PSA 1.30 09/15/2017   INR 1.02 11/20/2017    No results found.  Assessment & Plan:   There are no diagnoses linked to this encounter.   No orders of the defined types were placed in this encounter.    Follow-up: No follow-ups on file.  Walker Kehr, MD

## 2018-07-21 NOTE — Assessment & Plan Note (Signed)
CT abd Labs

## 2018-08-05 ENCOUNTER — Ambulatory Visit (INDEPENDENT_AMBULATORY_CARE_PROVIDER_SITE_OTHER)
Admission: RE | Admit: 2018-08-05 | Discharge: 2018-08-05 | Disposition: A | Discharge status: Home / Self Care | Payer: BLUE CROSS/BLUE SHIELD | Source: Ambulatory Visit | Attending: Internal Medicine | Admitting: Internal Medicine

## 2018-08-05 DIAGNOSIS — R1031 Right lower quadrant pain: Secondary | ICD-10-CM | POA: Diagnosis not present

## 2018-08-05 DIAGNOSIS — C689 Malignant neoplasm of urinary organ, unspecified: Secondary | ICD-10-CM

## 2018-08-05 MED ORDER — IOPAMIDOL (ISOVUE-300) INJECTION 61%
100.0000 mL | Freq: Once | INTRAVENOUS | Status: AC | PRN
  Administered 2018-08-05: 100 mL via INTRAVENOUS

## 2018-08-11 ENCOUNTER — Other Ambulatory Visit (INDEPENDENT_AMBULATORY_CARE_PROVIDER_SITE_OTHER): Payer: BLUE CROSS/BLUE SHIELD

## 2018-08-11 ENCOUNTER — Encounter: Payer: Self-pay | Admitting: Internal Medicine

## 2018-08-11 ENCOUNTER — Ambulatory Visit: Payer: BLUE CROSS/BLUE SHIELD | Admitting: Internal Medicine

## 2018-08-11 VITALS — BP 112/70 | HR 80 | Temp 97.7°F | Ht 75.0 in | Wt 255.0 lb

## 2018-08-11 DIAGNOSIS — R1011 Right upper quadrant pain: Secondary | ICD-10-CM

## 2018-08-11 LAB — LIPASE: LIPASE: 25 U/L (ref 11.0–59.0)

## 2018-08-11 NOTE — Patient Instructions (Signed)
We will recheck the labs today and call you back about the results.

## 2018-08-11 NOTE — Assessment & Plan Note (Signed)
Lipase elevated at last labs but no evidence for acute pancreatitis on CT scan. Advised we will recheck lipase. Other labs were normal. Offered HIDA scan for gall bladder given symptoms mostly RUQ and associated with food. He is already on PPI.

## 2018-08-11 NOTE — Progress Notes (Signed)
   Subjective:    Patient ID: Christian Kennedy, male    DOB: 11-Jul-1966, 52 y.o.   MRN: [id number]  HPI The patient is a 52 YO man coming in for ongoing RUQ pain. Started about 6 months or so ago. He has tried drinking baking soda daily for some months and initially felt it was working, stopped. The pain resumed so he started taking it again and felt it was not working. Taking aciphex currently. The pain is worse with spicy or fatty foods. Actually most foods. He cannot play with grandchildren due to having pain once putting them down. The pain radiates to the back. He denies nausea or vomiting. He does have some chronic constipation with 2-3 BM per week. This was gone when taking baking soda daily. He does have some herbs all the time from someone from [place] but she is gone currently. He has done needling/accupuncture in the past for other problems. Denies blood in stool. Recent CT abdomen without evidence for pancreatitis (although lipase 180 same time) or evidence for gallstones or other acute etiology. He is not taking any prescribed medications for this. He is a current smoker but denies drinking any alcohol. He has changed diet and is intentionally losing weight (13 pounds in last 3 weeks).   Review of Systems  Constitutional: Positive for appetite change.  HENT: Negative.   Eyes: Negative.   Respiratory: Negative for cough, chest tightness and shortness of breath.   Cardiovascular: Negative for chest pain, palpitations and leg swelling.  Gastrointestinal: Positive for abdominal pain. Negative for abdominal distention, constipation, diarrhea, nausea and vomiting.  Musculoskeletal: Positive for back pain.  Skin: Negative.   Neurological: Negative.   Psychiatric/Behavioral: Negative.       Objective:   Physical Exam  Constitutional: He is oriented to person, place, and time. He appears well-developed and well-nourished.  HENT:  Head: Normocephalic and atraumatic.  Eyes: EOM are normal.    Neck: Normal range of motion.  Cardiovascular: Normal rate and regular rhythm.  Pulmonary/Chest: Effort normal and breath sounds normal. No respiratory distress. He has no wheezes. He has no rales.  Abdominal: Soft. Bowel sounds are normal. He exhibits no distension. There is tenderness. There is no rebound.  Tenderness mild RUQ, BS normal  Musculoskeletal: He exhibits no edema.  Neurological: He is alert and oriented to person, place, and time. Coordination normal.  Skin: Skin is warm and dry.   Vitals:   08/11/18 1546  BP: 112/70  Pulse: 80  Temp: 97.7 F (36.5 C)  TempSrc: Oral  SpO2: 99%  Weight: 255 lb (115.7 kg)  Height: 6\' 3"  (1.905 m)      Assessment & Plan:  Visit time 25 minutes: greater than 50% of that time was spent in face to face counseling and coordination of care with the patient: counseled about etiologies which are a possibility, possible treatments, the efficacy of alternative treatments, options for further testing.

## 2018-08-12 ENCOUNTER — Encounter: Payer: Self-pay | Admitting: Internal Medicine

## 2018-09-16 ENCOUNTER — Ambulatory Visit (INDEPENDENT_AMBULATORY_CARE_PROVIDER_SITE_OTHER): Payer: BLUE CROSS/BLUE SHIELD | Admitting: Internal Medicine

## 2018-09-16 ENCOUNTER — Encounter: Payer: Self-pay | Admitting: Internal Medicine

## 2018-09-16 ENCOUNTER — Ambulatory Visit (INDEPENDENT_AMBULATORY_CARE_PROVIDER_SITE_OTHER)
Admission: RE | Admit: 2018-09-16 | Discharge: 2018-09-16 | Disposition: A | Discharge status: Home / Self Care | Payer: BLUE CROSS/BLUE SHIELD | Source: Ambulatory Visit | Attending: Internal Medicine | Admitting: Internal Medicine

## 2018-09-16 VITALS — BP 118/72 | HR 76 | Temp 97.7°F | Ht 75.0 in | Wt 265.0 lb

## 2018-09-16 DIAGNOSIS — K2100 Gastro-esophageal reflux disease with esophagitis, without bleeding: Secondary | ICD-10-CM

## 2018-09-16 DIAGNOSIS — M4804 Spinal stenosis, thoracic region: Secondary | ICD-10-CM | POA: Diagnosis not present

## 2018-09-16 DIAGNOSIS — R1011 Right upper quadrant pain: Secondary | ICD-10-CM | POA: Diagnosis not present

## 2018-09-16 DIAGNOSIS — R0789 Other chest pain: Secondary | ICD-10-CM | POA: Diagnosis not present

## 2018-09-16 MED ORDER — TRAMADOL HCL 50 MG PO TABS: 50.0000 mg | ORAL_TABLET | Freq: Four times a day (QID) | ORAL | 1 refills | Status: DC | PRN

## 2018-09-16 NOTE — Assessment & Plan Note (Signed)
Aciphex 

## 2018-09-16 NOTE — Progress Notes (Signed)
Subjective:  Patient ID: Christian Kennedy, male    DOB: June 05, 1966  Age: 52 y.o. MRN: [id number]  CC: No chief complaint on file.   HPI Christian Kennedy presents for severe R side pain x2 month - spasms every 30-60 min during the day. None in am. Pain starts in am when starting to move (cramp x seconds). No pain w/walking. Pain is in the rib area below the R breast and below the R shoulder blade (less pain). Worse w/laying down and getting out of bed.GERD meds do not help.  Feeling well ootherwise....  Outpatient Medications Prior to Visit  Medication Sig Dispense Refill  . B Complex-C (SUPER B COMPLEX PO) Take by mouth.    . Cholecalciferol (VITAMIN D3) 2000 units capsule Take 1 capsule (2,000 Units total) by mouth daily. 100 capsule 3  . milk thistle 175 MG tablet Take 175 mg by mouth daily.    . ondansetron (ZOFRAN) 4 MG tablet Take 1 tablet (4 mg total) by mouth daily as needed for nausea or vomiting. 20 tablet 1  . RABEprazole (ACIPHEX) 20 MG tablet Take 1 tablet (20 mg total) by mouth daily. 30 tablet 11  . traMADol (ULTRAM) 50 MG tablet Take 1-2 tablets (50-100 mg total) by mouth every 6 (six) hours as needed. 40 tablet 0   No facility-administered medications prior to visit.     ROS: Review of Systems  Objective:  There were no vitals taken for this visit.  BP Readings from Last 3 Encounters:  08/11/18 112/70  07/21/18 124/68  11/23/17 118/81    Wt Readings from Last 3 Encounters:  08/11/18 255 lb (115.7 kg)  07/21/18 268 lb (121.6 kg)  11/19/17 267 lb 6.7 oz (121.3 kg)    Physical Exam  Lab Results  Component Value Date   WBC 6.9 07/21/2018   HGB 16.0 07/21/2018   HCT 46.3 07/21/2018   PLT 188.0 07/21/2018   GLUCOSE 92 07/21/2018   CHOL 194 09/15/2017   TRIG 237.0 (H) 09/15/2017   HDL 36.10 (L) 09/15/2017   LDLDIRECT 148.0 09/15/2017   ALT 11 07/21/2018   AST 8 07/21/2018   NA 139 07/21/2018   K 4.1 07/21/2018   CL 106 07/21/2018   CREATININE 0.84  07/21/2018   BUN 13 07/21/2018   CO2 25 07/21/2018   TSH 0.61 09/15/2017   PSA 1.30 09/15/2017   INR 1.02 11/20/2017    Ct Abdomen Pelvis W Contrast  Result Date: 08/06/2018 CLINICAL DATA:  Right lower quadrant pain for a year. History of urothelial cancer. EXAM: CT ABDOMEN AND PELVIS WITH CONTRAST TECHNIQUE: Multidetector CT imaging of the abdomen and pelvis was performed using the standard protocol following bolus administration of intravenous contrast. CONTRAST:  147mL ISOVUE-300 IOPAMIDOL (ISOVUE-300) INJECTION 61% COMPARISON:  10/04/2017. FINDINGS: Lower chest: Clear lung bases.  Heart normal size. Hepatobiliary: No focal liver abnormality is seen. No gallstones, gallbladder wall thickening, or biliary dilatation. Pancreas: Unremarkable. No pancreatic ductal dilatation or surrounding inflammatory changes. Spleen: Normal in size without focal abnormality. Adrenals/Urinary Tract: No adrenal masses. Kidneys normal size, orientation and position with symmetric enhancement and excretion. Low-density anterior upper pole left renal mass, stable, consistent with a cyst. No other renal masses, no stones and no hydronephrosis. Normal ureters. Bladder is unremarkable. Previously seen bladder mass is no longer present. Stomach/Bowel: Stomach is within normal limits. Appendix appears normal. No evidence of bowel wall thickening, distention, or inflammatory changes. Vascular/Lymphatic: Mild aortic atherosclerosis. Vessels otherwise unremarkable. No adenopathy. Reproductive:  Prostate mildly enlarged measuring 6 x 4 cm. Other: No abdominal wall hernia or abnormality. No abdominopelvic ascites. Musculoskeletal: No fracture or acute finding. No osteoblastic or osteolytic lesions. IMPRESSION: 1. No acute findings within the abdomen or pelvis. No findings to account for the chronic right lower quadrant pain. Normal appendix visualized. 2. No evidence of recurrent bladder carcinoma or metastatic disease. 3. Mild aortic  atherosclerosis. Electronically Signed   By: Lajean Manes M.D.   On: 08/06/2018 09:55    Assessment & Plan:   There are no diagnoses linked to this encounter.   No orders of the defined types were placed in this encounter.    Follow-up: No follow-ups on file.  Walker Kehr, MD

## 2018-09-16 NOTE — Assessment & Plan Note (Signed)
R x2 mo ?etiology Thor spine X ray Chest CT Consider HIDA scan

## 2018-09-19 ENCOUNTER — Telehealth: Payer: Self-pay

## 2018-09-19 DIAGNOSIS — R0789 Other chest pain: Secondary | ICD-10-CM

## 2018-09-19 NOTE — Telephone Encounter (Signed)
-----   Message from Octavio Manns sent at 09/19/2018 10:21 AM EST ----- Can you please put a lab order for BMET in for pt to have before his CT scan.  Thanks Cecille Rubin

## 2018-09-19 NOTE — Telephone Encounter (Signed)
ordered

## 2018-09-22 ENCOUNTER — Other Ambulatory Visit: Payer: BLUE CROSS/BLUE SHIELD

## 2018-10-03 ENCOUNTER — Other Ambulatory Visit: Payer: BLUE CROSS/BLUE SHIELD

## 2018-10-05 ENCOUNTER — Inpatient Hospital Stay: Admission: RE | Admit: 2018-10-05 | Payer: BLUE CROSS/BLUE SHIELD | Source: Ambulatory Visit

## 2018-10-19 ENCOUNTER — Inpatient Hospital Stay: Admission: RE | Admit: 2018-10-19 | Payer: BLUE CROSS/BLUE SHIELD | Source: Ambulatory Visit

## 2018-11-02 ENCOUNTER — Other Ambulatory Visit (INDEPENDENT_AMBULATORY_CARE_PROVIDER_SITE_OTHER): Payer: BLUE CROSS/BLUE SHIELD

## 2018-11-02 DIAGNOSIS — R0789 Other chest pain: Secondary | ICD-10-CM | POA: Diagnosis not present

## 2018-11-02 LAB — BASIC METABOLIC PANEL
BUN: 19 mg/dL (ref 6–23)
CALCIUM: 9.6 mg/dL (ref 8.4–10.5)
CHLORIDE: 103 meq/L (ref 96–112)
CO2: 26 meq/L (ref 19–32)
Creatinine, Ser: 0.83 mg/dL (ref 0.40–1.50)
GFR: 96.68 mL/min (ref >60.00)
GLUCOSE: 94 mg/dL (ref 70–99)
POTASSIUM: 4.6 meq/L (ref 3.5–5.1)
SODIUM: 138 meq/L (ref 135–145)

## 2018-11-03 ENCOUNTER — Ambulatory Visit (INDEPENDENT_AMBULATORY_CARE_PROVIDER_SITE_OTHER)
Admission: RE | Admit: 2018-11-03 | Discharge: 2018-11-03 | Disposition: A | Discharge status: Home / Self Care | Payer: BLUE CROSS/BLUE SHIELD | Source: Ambulatory Visit | Attending: Internal Medicine | Admitting: Internal Medicine

## 2018-11-03 DIAGNOSIS — R0789 Other chest pain: Secondary | ICD-10-CM | POA: Diagnosis not present

## 2018-11-03 DIAGNOSIS — R1011 Right upper quadrant pain: Secondary | ICD-10-CM

## 2018-11-03 MED ORDER — IOPAMIDOL (ISOVUE-300) INJECTION 61%
80.0000 mL | Freq: Once | INTRAVENOUS | Status: AC | PRN
  Administered 2018-11-03: 80 mL via INTRAVENOUS

## 2019-05-31 ENCOUNTER — Encounter: Payer: Self-pay | Admitting: Podiatry

## 2019-05-31 ENCOUNTER — Ambulatory Visit: Payer: BC Managed Care – PPO | Admitting: Podiatry

## 2019-05-31 ENCOUNTER — Other Ambulatory Visit: Payer: Self-pay

## 2019-05-31 DIAGNOSIS — L600 Ingrowing nail: Secondary | ICD-10-CM | POA: Diagnosis not present

## 2019-05-31 MED ORDER — NEOMYCIN-POLYMYXIN-HC 3.5-10000-1 OT SOLN: OTIC | 1 refills | Status: DC

## 2019-05-31 NOTE — Patient Instructions (Signed)

## 2019-05-31 NOTE — Progress Notes (Signed)
Subjective:   Patient ID: Christian Kennedy, male   DOB: 53 y.o.   MRN: [id number]   HPI Patient presents today has had a very painful ingrown toenail the right big toe and that he used to be able to get it cut out but he no longer Camn[place] increasingly painful with palpation.  Patient smokes 1 pack/day and would like to be more active   Review of Systems  All other systems reviewed and are negative.       Objective:  Physical Exam Vitals signs and nursing note reviewed.  Constitutional:      Appearance: He is well-developed.  Pulmonary:     Effort: Pulmonary effort is normal.  Musculoskeletal: Normal range of motion.  Skin:    General: Skin is warm.  Neurological:     Mental Status: He is alert.     Neurovascular status intact muscle strength found to be adequate with range of motion within normal limits.  Patient is found to have incurvated lateral border right hallux that is painful when pressed and make shoe gear difficult with no redness or drainage noted associated with it except for structural changes to the nailbed.  Patient has good digital perfusion well oriented x3     Assessment:  Chronic ingrown toenail deformity right hallux lateral border that is painful     Plan:  H&P reviewed condition and recommended correction of deformity.  Patient wants surgery understanding surgical correction and risk and at this point signed consent form.  I infiltrated the right hallux 60 mg like Marcaine mixture sterile prep applied to the toe using sterile instrumentation I remove lateral border exposed matrix and applied phenol 3 applications 30 seconds followed by alcohol lavage sterile dressing.  Gave instructions on soaks and reappoint

## 2019-06-04 ENCOUNTER — Other Ambulatory Visit: Payer: Self-pay | Admitting: Internal Medicine

## 2019-06-06 NOTE — Telephone Encounter (Signed)
[place] Controlled Database Checked Last filled: 12/12/18 # 60 LOV w/you: 09/16/18 Next appt w/you: None

## 2019-06-13 ENCOUNTER — Other Ambulatory Visit: Payer: Self-pay | Admitting: *Deleted

## 2019-06-13 NOTE — Telephone Encounter (Signed)
06/12/19 Tramadol refill failed to send to pharmacy. Please resend.

## 2019-06-16 MED ORDER — TRAMADOL HCL 50 MG PO TABS: ORAL_TABLET | ORAL | 0 refills | Status: DC

## 2019-09-25 ENCOUNTER — Other Ambulatory Visit: Payer: Self-pay | Admitting: Internal Medicine

## 2019-11-20 ENCOUNTER — Other Ambulatory Visit: Payer: Self-pay

## 2019-11-21 MED ORDER — RABEPRAZOLE SODIUM 20 MG PO TBEC: 20.0000 mg | DELAYED_RELEASE_TABLET | Freq: Every day | ORAL | 2 refills | Status: DC

## 2020-04-10 ENCOUNTER — Other Ambulatory Visit: Payer: Self-pay

## 2020-04-10 ENCOUNTER — Ambulatory Visit
Admission: EM | Admit: 2020-04-10 | Discharge: 2020-04-10 | Disposition: A | Discharge status: Home / Self Care | Payer: 59 | Attending: Emergency Medicine | Admitting: Emergency Medicine

## 2020-04-10 DIAGNOSIS — S46101A Unspecified injury of muscle, fascia and tendon of long head of biceps, right arm, initial encounter: Secondary | ICD-10-CM | POA: Diagnosis not present

## 2020-04-10 DIAGNOSIS — M79601 Pain in right arm: Secondary | ICD-10-CM

## 2020-04-10 MED ORDER — MELOXICAM 15 MG PO TABS: 15.0000 mg | ORAL_TABLET | Freq: Every day | ORAL | 0 refills | Status: DC

## 2020-04-10 NOTE — Discharge Instructions (Signed)
Exam and history concerning for partial biceps tendon tear Continue conservative management of rest, ice, and gentle stretches Ace (from home) and sling applied Take mobic as needed for pain relief (may cause abdominal discomfort, ulcers, and GI bleeds avoid taking with other NSAIDs) Follow up with orthopedist Return or go to the ER if you have any new or worsening symptoms (fever, chills, chest pain, redness, swelling, bruising, worsening symptoms despite treatment, etc...)

## 2020-04-10 NOTE — ED Provider Notes (Signed)
[place]   [id number] 04/10/20 Arrival Time: 7353  CC: RT arm pain  SUBJECTIVE: History from: patient. Christian Kennedy is a 54 y.o. male complains of RT arm pain x 2 days.  Putting away [employer] and felt a pop in RT bicep.  Localizes the pain to the RT bicep, and upper arm.  Describes the pain as intermittent and "stretching" in character.  Denies alleviating factors.  Symptoms are made worse with reaching.  Denies similar symptoms in the past.  Denies fever, chills, erythema, ecchymosis, effusion, weakness, numbness and tingling.      ROS: As per HPI.  All other pertinent ROS negative.     Past Medical History:  Diagnosis Date  . Abscess    Right buttocks  . Bladder tumor   . Depression    Son passed 2014  . Ear infection   . Eating disorder   . Frequent headaches   . GERD (gastroesophageal reflux disease)   . Helicobacter pylori ab+ 10/27/2017  . Hepatitis    childhood, jaundice  . Migraines   . Urinary incontinence   . Varicose vein of leg    Right leg  . Vitamin D deficiency    Past Surgical History:  Procedure Laterality Date  . CYSTOSCOPY W/ URETERAL STENT PLACEMENT Right 11/19/2017   Procedure: CYSTOSCOPY WITH RETROGRADE PYELOGRAM/URETERAL STENT PLACEMENT;  Surgeon: Ceasar Mons, MD;  Location: [facility];  Service: Urology;  Laterality: Right;  . CYSTOSCOPY WITH FULGERATION N/A 11/19/2017   Procedure: [place] AND CLOT EVACUATION;  Surgeon: Nickie Retort, MD;  Location: [facility];  Service: Urology;  Laterality: N/A;  . TRANSURETHRAL RESECTION OF BLADDER TUMOR N/A 11/19/2017   Procedure: TRANSURETHRAL RESECTION OF BLADDER TUMOR (TURBT);  Surgeon: Ceasar Mons, MD;  Location: [facility];  Service: Urology;  Laterality: N/A;   No Known Allergies No current facility-administered medications on file prior to encounter.   Current Outpatient Medications on File Prior to  Encounter  Medication Sig Dispense Refill  . B Complex-C (SUPER B COMPLEX PO) Take by mouth.    . Cholecalciferol (VITAMIN D3) 2000 units capsule Take 1 capsule (2,000 Units total) by mouth daily. 100 capsule 3  . RABEprazole (ACIPHEX) 20 MG tablet Take 1 tablet (20 mg total) by mouth daily. 30 tablet 2  . traMADol (ULTRAM) 50 MG tablet TAKE 1 TO 2 TABLETS BY MOUTH EVERY 6 HOURS AS NEEDED FOR SEVERE PAIN 60 tablet 0   Social History   Socioeconomic History  . Marital status: Married    Spouse name: Not on file  . Number of children: Not on file  . Years of education: Not on file  . Highest education level: Not on file  Occupational History  . Not on file  Tobacco Use  . Smoking status: Current Every Day Smoker    Packs/day: 1.00    Years: 30.00    Pack years: 30.00    Types: Cigarettes  . Smokeless tobacco: Never Used  Vaping Use  . Vaping Use: Never used  Substance and Sexual Activity  . Alcohol use: Yes    Comment: rare  . Drug use: No  . Sexual activity: Not on file  Other Topics Concern  . Not on file  Social History Narrative  . Not on file   Social Determinants of Health   Financial Resource Strain:   . Difficulty of Paying Living Expenses:   Food Insecurity:   . Worried About  Running Out of Food in the Last Year:   . [place] in the Last Year:   Transportation Needs:   . Lack of Transportation (Medical):   Marland Kitchen Lack of Transportation (Non-Medical):   Physical Activity:   . Days of Exercise per Week:   . Minutes of Exercise per Session:   Stress:   . Feeling of Stress :   Social Connections:   . Frequency of Communication with Friends and Family:   . Frequency of Social Gatherings with Friends and Family:   . Attends Religious Services:   . Active Member of Clubs or Organizations:   . Attends Archivist Meetings:   Marland Kitchen Marital Status:   Intimate Partner Violence:   . Fear of Current or Ex-Partner:   . Emotionally Abused:   Marland Kitchen Physically  Abused:   . Sexually Abused:    Family History  Problem Relation Age of Onset  . Cancer Mother   . Depression Mother   . Arthritis Father   . Depression Father   . Hearing loss Father   . Depression Daughter   . Early death Son     OBJECTIVE:  Vitals:   04/10/20 1324  BP: (!) 134/91  Pulse: 96  Resp: 17  Temp: 98.1 F (36.7 C)  TempSrc: Tympanic  SpO2: 96%    General appearance: ALERT; in no acute distress.  Head: NCAT Lungs: Normal respiratory effort CV: Radial pulse 2+; cap refill <2 seconds Musculoskeletal: RT arm Inspection: mild swelling over anterior distal upper arm Palpation: Mildly TTP over distal biceps ROM: FROM about elbow Strength: 5/5 elbow flexion, 5/5 elbow extension, 5/5 grip strength Skin: warm and dry Neurologic: Ambulates without difficulty; Sensation intact about the upper extremities Psychological: alert and cooperative; normal mood and affect   ASSESSMENT & PLAN:  1. Injury of tendon of long head of right biceps, initial encounter   2. Right arm pain    Meds ordered this encounter  Medications  . meloxicam (MOBIC) 15 MG tablet    Sig: Take 1 tablet (15 mg total) by mouth daily.    Dispense:  30 tablet    Refill:  0    Order Specific Question:   Supervising Provider    Answer:   Raylene Everts [[id number]]   Exam and history concerning for partial biceps tendon tear Continue conservative management of rest, ice, and gentle stretches Ace (from home) and sling applied Take mobic as needed for pain relief (may cause abdominal discomfort, ulcers, and GI bleeds avoid taking with other NSAIDs) Follow up with orthopedist Return or go to the ER if you have any new or worsening symptoms (fever, chills, chest pain, redness, swelling, bruising, worsening symptoms despite treatment, etc...)   Reviewed expectations re: course of current medical issues. Questions answered. Outlined signs and symptoms indicating need for more acute  intervention. Patient verbalized understanding. After Visit Summary given.    Lestine Box, PA-C 04/10/20 1340

## 2020-04-10 NOTE — ED Triage Notes (Signed)
Pt presents with complaints of right arm pain after lifting some heavy stuff on Monday by himself. Pt reports hearing a pop while he was doing the lifting. Patient has swelling to his right upper arm.

## 2020-04-11 ENCOUNTER — Ambulatory Visit: Payer: 59 | Admitting: Orthopaedic Surgery

## 2020-04-11 ENCOUNTER — Encounter: Payer: Self-pay | Admitting: Orthopaedic Surgery

## 2020-04-11 VITALS — BP 146/83 | HR 74 | Ht 75.0 in | Wt 274.4 lb

## 2020-04-11 DIAGNOSIS — S46211A Strain of muscle, fascia and tendon of other parts of biceps, right arm, initial encounter: Secondary | ICD-10-CM | POA: Diagnosis not present

## 2020-04-11 DIAGNOSIS — F17210 Nicotine dependence, cigarettes, uncomplicated: Secondary | ICD-10-CM

## 2020-04-11 NOTE — Progress Notes (Signed)
Subjective:    Patient ID: Christian Kennedy, male    DOB: July 03, 1966, 54 y.o.   MRN: [id number]  HPI He was trying to pick up a tarp with heavy object on it four days ago.  He felt pop and had pain in the right biceps area of right upper arm.  He had some swelling.  He had no numbness.  He went to [facility] yesterday.  He was evaluated.  I have reviewed the notes and x-rays.  I have independently reviewed and interpreted x-rays of this patient done at another site by another physician or qualified health professional.  He has biceps tendon rupture on the right with muscle retracted to distally above elbow.    I have explained the findings to him. I have told him surgery is possible but more difficult when age over 60-35.  He would like to know if surgery would help.  I will have him see Dr. Marlou Sa at [facility].  Appointment made.  He has a sling but prefers not to use it.  He has no numbness, no redness.   Review of Systems  Constitutional: Positive for activity change.  Musculoskeletal: Positive for arthralgias.  All other systems reviewed and are negative.  For Review of Systems, all other systems reviewed and are negative.  The following is a summary of the past history medically, past history surgically, known current medicines, social history and family history.  This information is gathered electronically by the computer from prior information and documentation.  I review this each visit and have found including this information at this point in the chart is beneficial and informative.   Past Medical History:  Diagnosis Date  . Abscess    Right buttocks  . Bladder tumor   . Depression    Son passed 2014  . Ear infection   . Eating disorder   . Frequent headaches   . GERD (gastroesophageal reflux disease)   . Helicobacter pylori ab+ 10/27/2017  . Hepatitis    childhood, jaundice  . Migraines   . Urinary incontinence   . Varicose vein of leg    Right  leg  . Vitamin D deficiency     Past Surgical History:  Procedure Laterality Date  . CYSTOSCOPY W/ URETERAL STENT PLACEMENT Right 11/19/2017   Procedure: CYSTOSCOPY WITH RETROGRADE PYELOGRAM/URETERAL STENT PLACEMENT;  Surgeon: Ceasar Mons, MD;  Location: [facility];  Service: Urology;  Laterality: Right;  . CYSTOSCOPY WITH FULGERATION N/A 11/19/2017   Procedure: [place] AND CLOT EVACUATION;  Surgeon: Nickie Retort, MD;  Location: [facility];  Service: Urology;  Laterality: N/A;  . TRANSURETHRAL RESECTION OF BLADDER TUMOR N/A 11/19/2017   Procedure: TRANSURETHRAL RESECTION OF BLADDER TUMOR (TURBT);  Surgeon: Ceasar Mons, MD;  Location[facility];  Service: Urology;  Laterality: N/A;    Current Outpatient Medications on File Prior to Visit  Medication Sig Dispense Refill  . meloxicam (MOBIC) 15 MG tablet Take 1 tablet (15 mg total) by mouth daily. 30 tablet 0  . RABEprazole (ACIPHEX) 20 MG tablet Take 1 tablet (20 mg total) by mouth daily. 30 tablet 2  . B Complex-C (SUPER B COMPLEX PO) Take by mouth.    . Cholecalciferol (VITAMIN D3) 2000 units capsule Take 1 capsule (2,000 Units total) by mouth daily. 100 capsule 3  . traMADol (ULTRAM) 50 MG tablet TAKE 1 TO 2 TABLETS BY MOUTH EVERY 6 HOURS AS NEEDED FOR SEVERE PAIN  60 tablet 0   No current facility-administered medications on file prior to visit.    Social History   Socioeconomic History  . Marital status: Married    Spouse name: Not on file  . Number of children: Not on file  . Years of education: Not on file  . Highest education level: Not on file  Occupational History  . Not on file  Tobacco Use  . Smoking status: Current Every Day Smoker    Packs/day: 1.00    Years: 30.00    Pack years: 30.00    Types: Cigarettes  . Smokeless tobacco: Never Used  Vaping Use  . Vaping Use: Never used  Substance and Sexual Activity  . Alcohol use: Yes     Comment: rare  . Drug use: No  . Sexual activity: Not on file  Other Topics Concern  . Not on file  Social History Narrative  . Not on file   Social Determinants of Health   Financial Resource Strain:   . Difficulty of Paying Living Expenses:   Food Insecurity:   . Worried About Charity fundraiser in the Last Year:   . Arboriculturist in the Last Year:   Transportation Needs:   . Film/video editor (Medical):   Marland Kitchen Lack of Transportation (Non-Medical):   Physical Activity:   . Days of Exercise per Week:   . Minutes of Exercise per Session:   Stress:   . Feeling of Stress :   Social Connections:   . Frequency of Communication with Friends and Family:   . Frequency of Social Gatherings with Friends and Family:   . Attends Religious Services:   . Active Member of Clubs or Organizations:   . Attends Archivist Meetings:   Marland Kitchen Marital Status:   Intimate Partner Violence:   . Fear of Current or Ex-Partner:   . Emotionally Abused:   Marland Kitchen Physically Abused:   . Sexually Abused:     Family History  Problem Relation Age of Onset  . Cancer Mother   . Depression Mother   . Arthritis Father   . Depression Father   . Hearing loss Father   . Depression Daughter   . Early death Son     BP (!) 146/83   Pulse 74   Ht 6\' 3"  (1.905 m)   Wt 274 lb 6 oz (124.5 kg)   BMI 34.29 kg/m   Body mass index is 34.29 kg/m.     Objective:   Physical Exam Vitals and nursing note reviewed.  Constitutional:      Appearance: He is well-developed.  HENT:     Head: Normocephalic and atraumatic.  Eyes:     Conjunctiva/sclera: Conjunctivae normal.     Pupils: Pupils are equal, round, and reactive to light.  Cardiovascular:     Rate and Rhythm: Normal rate and regular rhythm.  Pulmonary:     Effort: Pulmonary effort is normal.  Abdominal:     Palpations: Abdomen is soft.  Musculoskeletal:       Arms:     Cervical back: Normal range of motion and neck supple.  Skin:     General: Skin is warm and dry.  Neurological:     Mental Status: He is alert and oriented to person, place, and time.     Cranial Nerves: No cranial nerve deficit.     Motor: No abnormal muscle tone.     Coordination: Coordination normal.     Deep  Tendon Reflexes: Reflexes are normal and symmetric. Reflexes normal.  Psychiatric:        Behavior: Behavior normal.        Thought Content: Thought content normal.        Judgment: Judgment normal.           Assessment & Plan:   Encounter Diagnoses  Name Primary?  . Rupture of right distal biceps tendon, initial encounter Yes  . Nicotine dependence, cigarettes, uncomplicated    I will have him see Dr. Marlou Sa in [facility] for surgical evaluation.  He may or may not be a candidate.  Call if any problem.  Precautions discussed.   Electronically Signed Sanjuana Kava, MD 7/15/202112:53 PM

## 2020-04-11 NOTE — Patient Instructions (Signed)

## 2020-04-12 ENCOUNTER — Ambulatory Visit (INDEPENDENT_AMBULATORY_CARE_PROVIDER_SITE_OTHER): Payer: 59 | Admitting: Orthopedic Surgery

## 2020-04-12 DIAGNOSIS — S46211A Strain of muscle, fascia and tendon of other parts of biceps, right arm, initial encounter: Secondary | ICD-10-CM

## 2020-04-13 ENCOUNTER — Encounter: Payer: Self-pay | Admitting: Orthopedic Surgery

## 2020-04-13 NOTE — Progress Notes (Signed)
Office Visit Note   Patient: Christian Kennedy           Date of Birth: 05/28/1966           MRN: [id number] Visit Date: 04/12/2020 Requested by: Sanjuana Kava, [address] [place],  [place] [address] PCP: Plotnikov, Evie Lacks, MD  Subjective: Chief Complaint  Patient presents with  . Arm Pain    HPI: Christian Kennedy is a 54 y.o. male who presents to the office complaining of right arm pain.  Patient injured his right arm on 04/08/2020 when he was trying to move the tarp of a bouncy Castle.  He felt a pop in his arm.  He denies any significant pain.  He does note discomfort of the right arm that he localizes to the proximal aspect of the bicep muscle belly.  He denies any weakness of the arm.  He denies any significant pain in his shoulder joint or his elbow joint.  He does have a history of a left rotator cuff tear that he rehabbed successfully in physical therapy over the course of 8 months.  He works as a Dealer..                ROS: All systems reviewed are negative as they relate to the chief complaint within the history of present illness.  Patient denies fevers or chills.  Assessment & Plan: Visit Diagnoses:  1. Biceps tendon rupture, proximal, right, initial encounter     Plan: Patient is a 54 year old male who presents complaining of right arm pain.  He was having occasional right shoulder pain over the last several months.  Then, on 04/08/2020 he felt a pop in his arm and noticed a sudden deformity.  Examination reveals Popeye deformity with tenderness over the bicipital groove.  He has no loss of strength and no injury to the distal bicep tendon.  Discussed the pathology behind proximal bicep ruptures with patient.  After lengthy discussion, patient will follow up as needed.  Patient agreed with this plan.  Understandably he does not want to proceed with surgical reattachment of the biceps.  However this biceps rupture could portend rotator cuff problems which are not  evident on exam today.  We did discuss with him at length the signs and symptoms of rotator cuff problems which may arise in the future.  If any of those do present then he will come back to see [place].  For now we will follow-up with him as needed.  Follow-Up Instructions: No follow-ups on file.   Orders:  No orders of the defined types were placed in this encounter.  No orders of the defined types were placed in this encounter.     Procedures: No procedures performed   Clinical Data: No additional findings.  Objective: Vital Signs: There were no vitals taken for this visit.  Physical Exam:  Constitutional: Patient appears well-developed HEENT:  Head: Normocephalic Eyes:EOM are normal Neck: Normal range of motion Cardiovascular: Normal rate Pulmonary/chest: Effort normal Neurologic: Patient is alert Skin: Skin is warm Psychiatric: Patient has normal mood and affect  Ortho Exam:  Ortho exam demonstrates no tenderness to palpation over the right distal biceps tendon with equal tendon contour compared with contralateral arm.  He has excellent strength of the bicep with 5/5 motor strength with elbow flexion.  He does have discomfort with supination.  No weakness with grip strength, finger abduction, pronation/supination, tricep.  Popeye deformity is present.  Mild tenderness palpation over  the bicipital groove.  Specialty Comments:  No specialty comments available.  Imaging: No results found.   PMFS History: Patient Active Problem List   Diagnosis Date Noted  . Chest pain, atypical 09/16/2018  . Urothelial cancer ([place]) 07/21/2018  . Helicobacter pylori ab+ 10/27/2017  . Bladder tumor 10/26/2017  . Hypogonadism male 09/16/2017  . GERD (gastroesophageal reflux disease) 09/16/2017  . Grief at loss of child 09/16/2017  . Vitamin D deficiency 09/16/2017  . Well adult exam 09/15/2017  . Dysuria 09/15/2017  . Migraines 09/15/2017  . Fatigue 09/15/2017  . RUQ pain  09/15/2017   Past Medical History:  Diagnosis Date  . Abscess    Right buttocks  . Bladder tumor   . Depression    Son passed 2014  . Ear infection   . Eating disorder   . Frequent headaches   . GERD (gastroesophageal reflux disease)   . Helicobacter pylori ab+ 10/27/2017  . Hepatitis    childhood, jaundice  . Migraines   . Urinary incontinence   . Varicose vein of leg    Right leg  . Vitamin D deficiency     Family History  Problem Relation Age of Onset  . Cancer Mother   . Depression Mother   . Arthritis Father   . Depression Father   . Hearing loss Father   . Depression Daughter   . Early death Son     Past Surgical History:  Procedure Laterality Date  . CYSTOSCOPY W/ URETERAL STENT PLACEMENT Right 11/19/2017   Procedure: CYSTOSCOPY WITH RETROGRADE PYELOGRAM/URETERAL STENT PLACEMENT;  Surgeon: Ceasar Mons, MD;  Location: [facility];  Service: Urology;  Laterality: Right;  . CYSTOSCOPY WITH FULGERATION N/A 11/19/2017   Procedure: [place] AND CLOT EVACUATION;  Surgeon: Nickie Retort, MD;  Location: [facility];  Service: Urology;  Laterality: N/A;  . TRANSURETHRAL RESECTION OF BLADDER TUMOR N/A 11/19/2017   Procedure: TRANSURETHRAL RESECTION OF BLADDER TUMOR (TURBT);  Surgeon: Ceasar Mons, MD;  Location: [facility];  Service: Urology;  Laterality: N/A;   Social History   Occupational History  . Not on file  Tobacco Use  . Smoking status: Current Every Day Smoker    Packs/day: 1.00    Years: 30.00    Pack years: 30.00    Types: Cigarettes  . Smokeless tobacco: Never Used  Vaping Use  . Vaping Use: Never used  Substance and Sexual Activity  . Alcohol use: Yes    Comment: rare  . Drug use: No  . Sexual activity: Not on file

## 2020-05-14 ENCOUNTER — Other Ambulatory Visit: Payer: Self-pay | Admitting: Internal Medicine

## 2020-06-14 IMAGING — CT CT ABD-PELV W/ CM
2 of 5 series · 16 of 46 positions shown, 18 images · IV contrast (iopamidol)
Comparison: 10/04/2017.

CLINICAL DATA: Right lower quadrant pain for a year. History of
urothelial cancer.

EXAM:
CT ABDOMEN AND PELVIS WITH CONTRAST
TECHNIQUE: Multidetector CT imaging of the abdomen and pelvis was performed
using the standard protocol following bolus administration of
intravenous contrast.
CONTRAST:  100mL YAG485-RTT IOPAMIDOL (YAG485-RTT) INJECTION 61%

[Series 2: abd/pel w · axial · 0.98mm/px · z∈[-606,-151]mm · 13 of 103 slices shown, 15 images]
[im 6/103  soft-tissue]
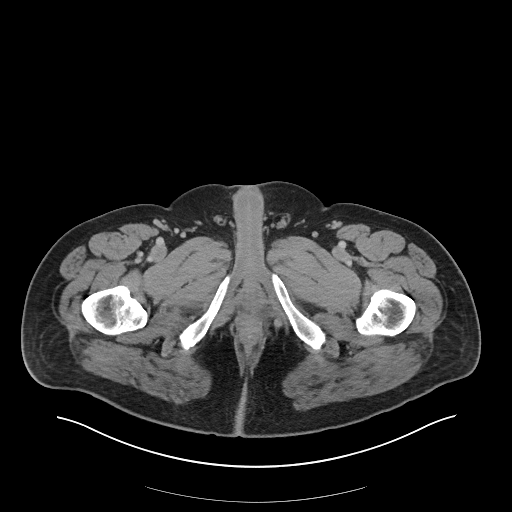
[im 6/103  bone]
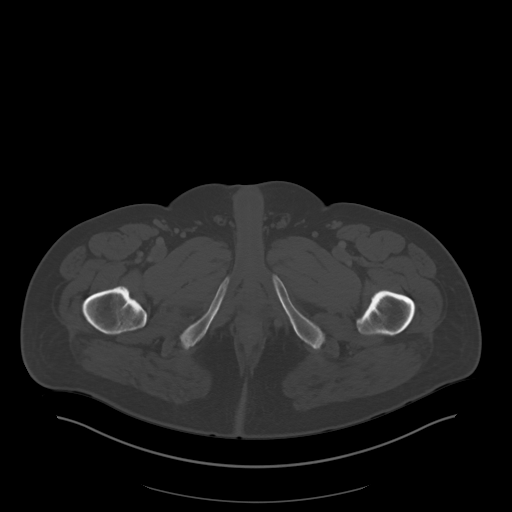
[im 16/103  soft-tissue]
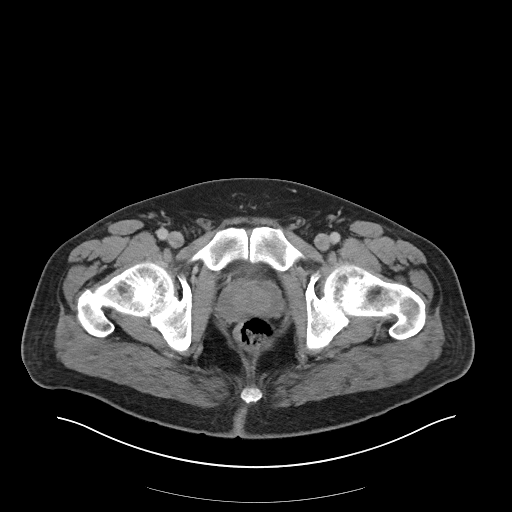
[im 21/103  soft-tissue]
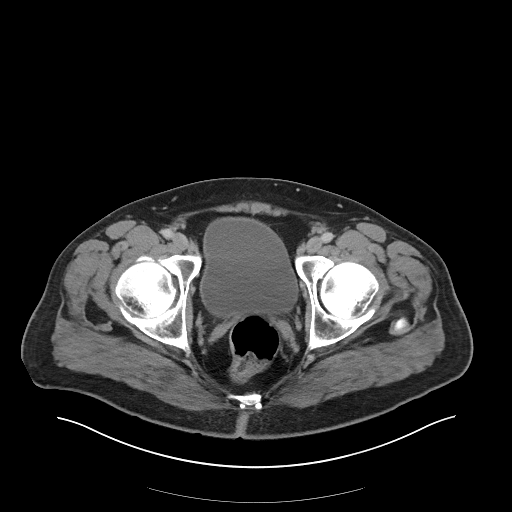
[im 31/103  soft-tissue]
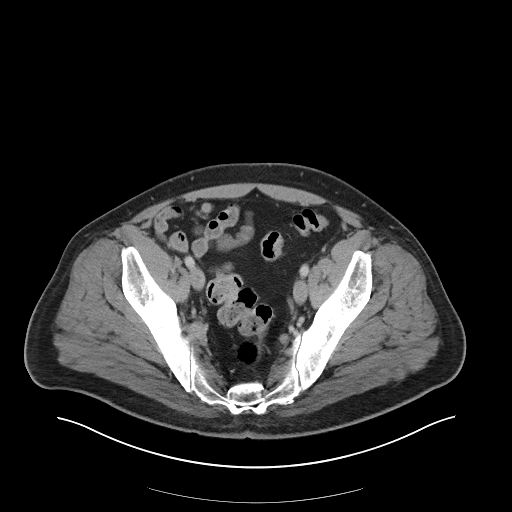
[im 36/103  soft-tissue]
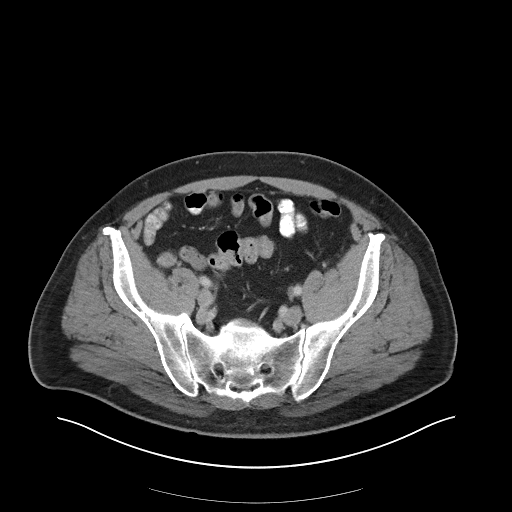
[im 46/103  soft-tissue]
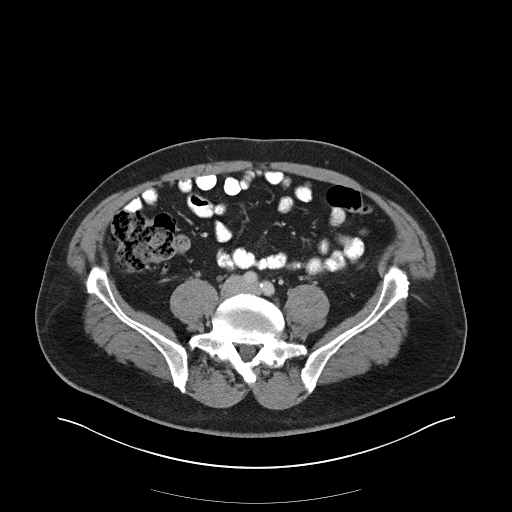
[im 52/103  soft-tissue]
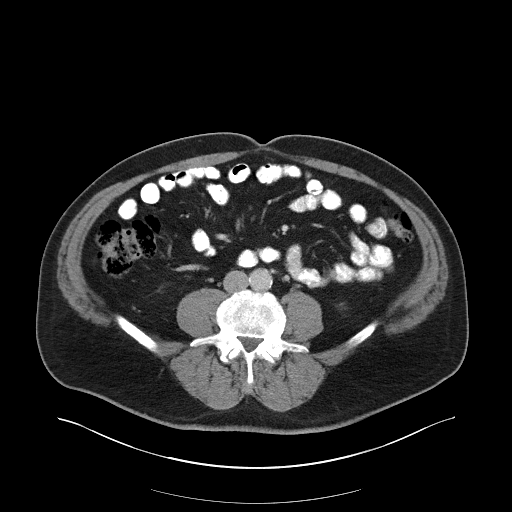
[im 57/103  soft-tissue]
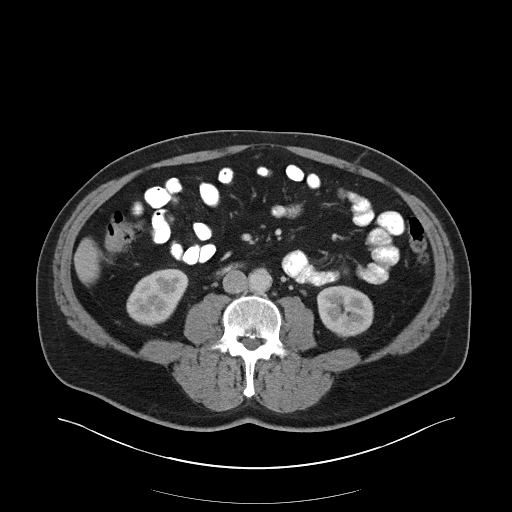
[im 67/103  soft-tissue]
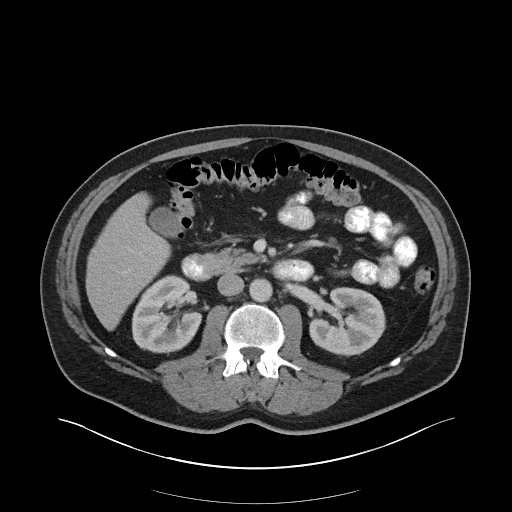
[im 67/103  bone]
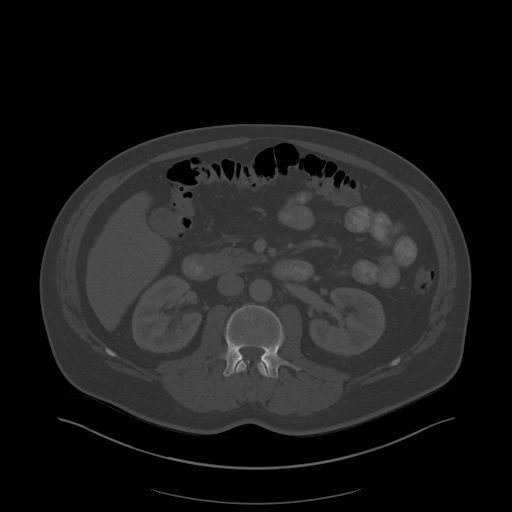
[im 72/103  soft-tissue]
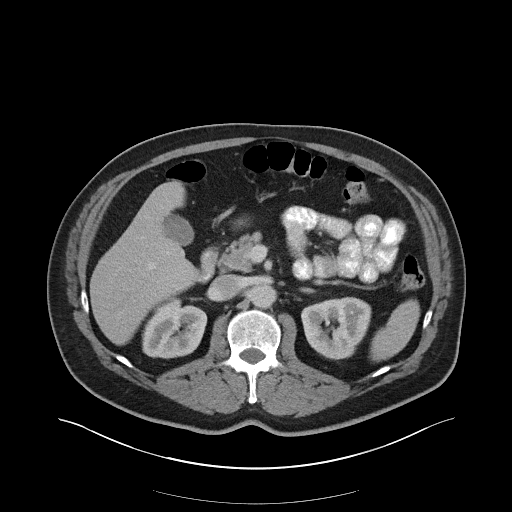
[im 82/103  soft-tissue]
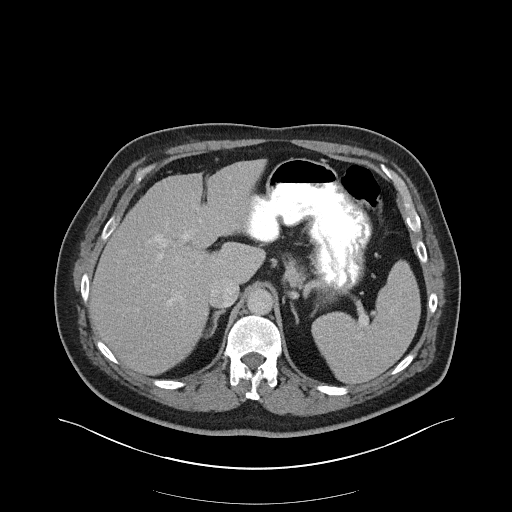
[im 87/103  soft-tissue]
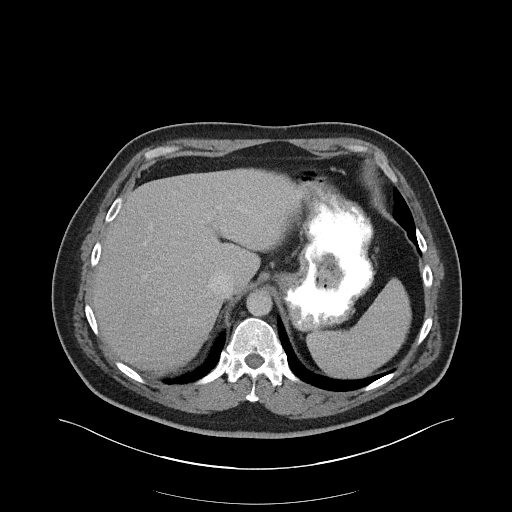
[im 97/103  soft-tissue]
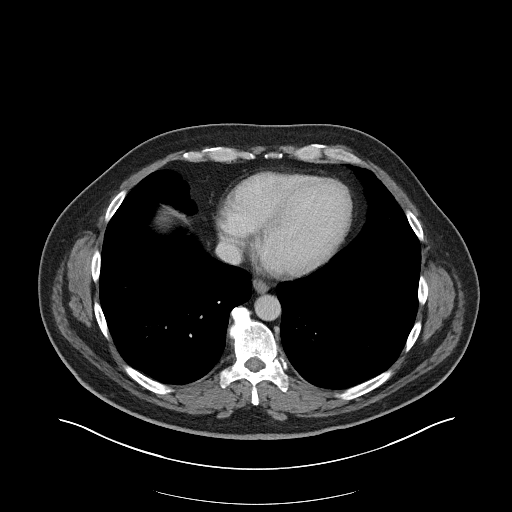

[Series 5: abd/pel w st · coronal · 0.88mm/px · 3 of 111 slices shown]
[im 37/111  soft-tissue]
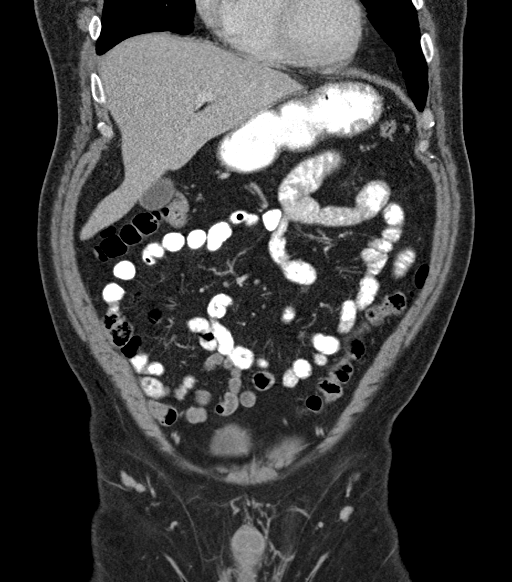
[im 49/111  soft-tissue]
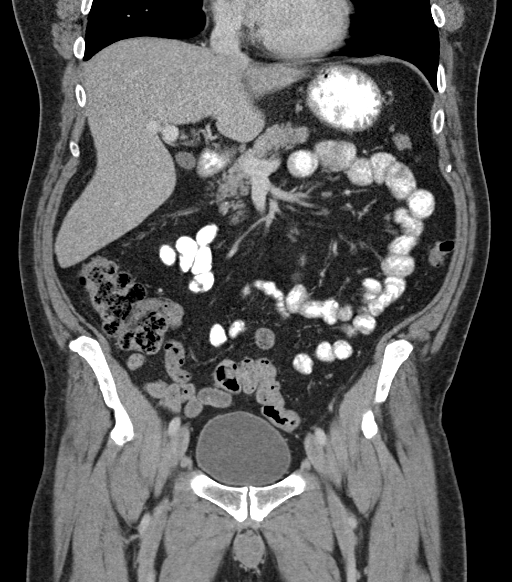
[im 62/111  soft-tissue]
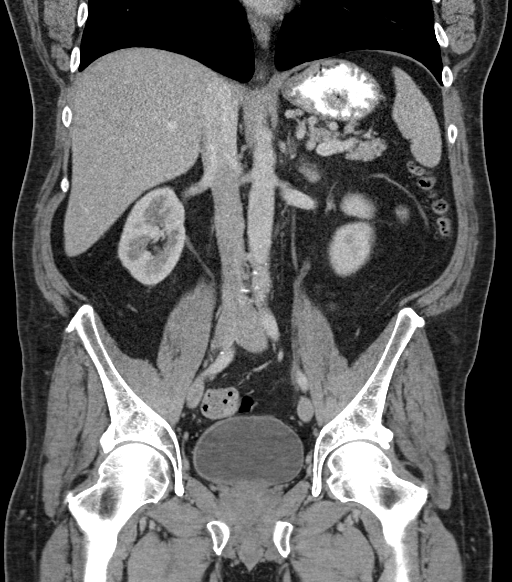

[16 of 46 positions shown; findings below may reference images not displayed]

FINDINGS: Lower chest: Clear lung bases.  Heart normal size.

Hepatobiliary: No focal liver abnormality is seen. No gallstones,
gallbladder wall thickening, or biliary dilatation.

Pancreas: Unremarkable. No pancreatic ductal dilatation or
surrounding inflammatory changes.

Spleen: Normal in size without focal abnormality.

Adrenals/Urinary Tract: No adrenal masses.

Kidneys normal size, orientation and position with symmetric
enhancement and excretion. Low-density anterior upper pole left
renal mass, stable, consistent with a cyst. No other renal masses,
no stones and no hydronephrosis. Normal ureters.

Bladder is unremarkable. Previously seen bladder mass is no longer
present.

Stomach/Bowel: Stomach is within normal limits. Appendix appears
normal. No evidence of bowel wall thickening, distention, or
inflammatory changes.

Vascular/Lymphatic: Mild aortic atherosclerosis. Vessels otherwise
unremarkable. No adenopathy.

Reproductive: Prostate mildly enlarged measuring 6 x 4 cm.

Other: No abdominal wall hernia or abnormality. No abdominopelvic
ascites.

Musculoskeletal: No fracture or acute finding. No osteoblastic or
osteolytic lesions.
IMPRESSION: 1. No acute findings within the abdomen or pelvis. No findings to
account for the chronic right lower quadrant pain. Normal appendix
visualized.
2. No evidence of recurrent bladder carcinoma or metastatic disease.
3. Mild aortic atherosclerosis.

## 2020-07-26 IMAGING — DX DG THORACIC SPINE 2V
3 series · 3 of 3 positions shown · non-contrast
Comparison: None.

CLINICAL DATA: Radio anterior chest pain over the last 6 months.

EXAM:
THORACIC SPINE 2 VIEWS

[t-spine ap]
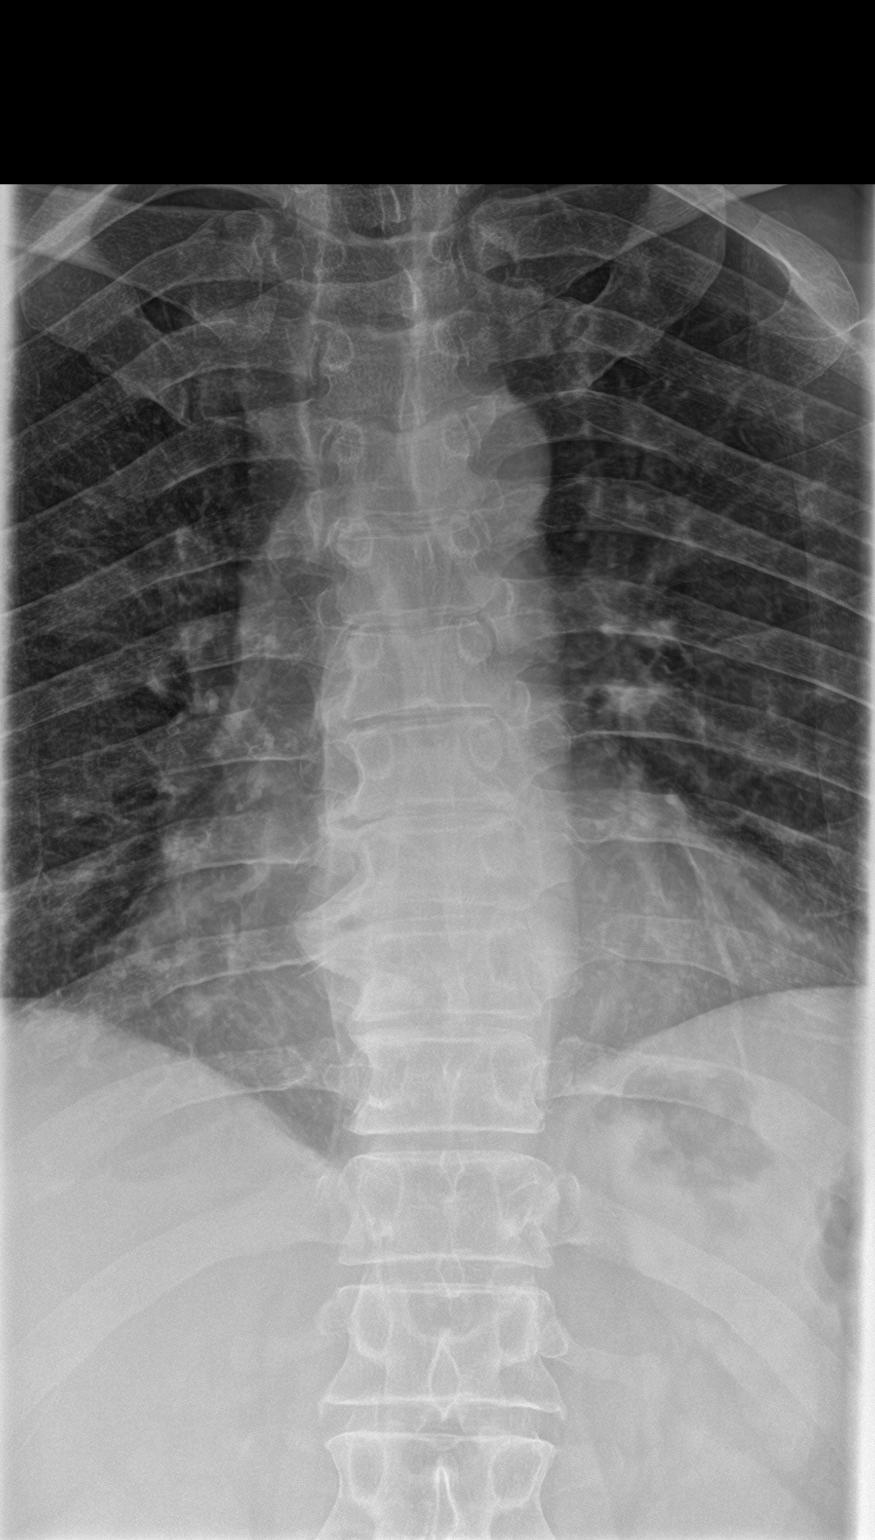

[t-spine lat]
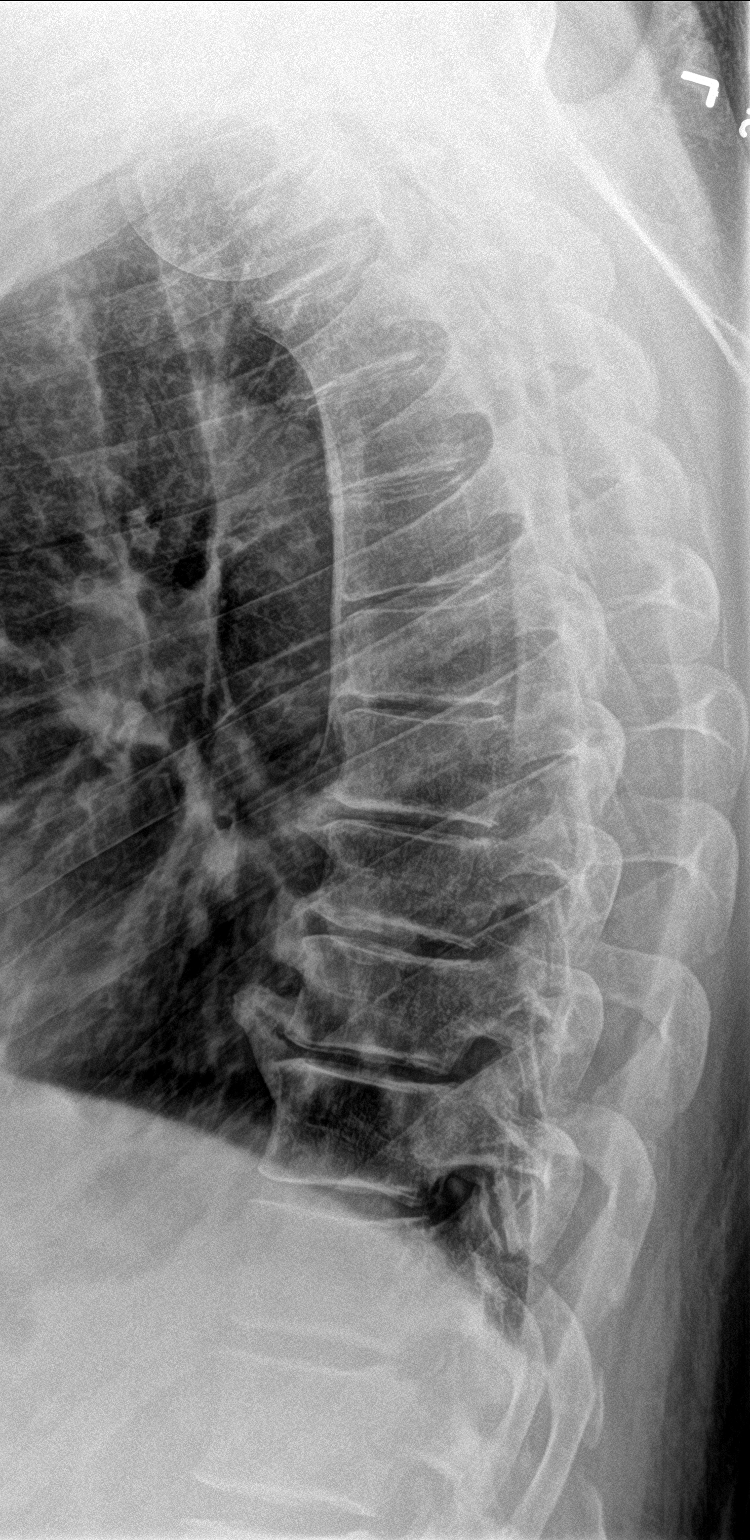

[swimmer]
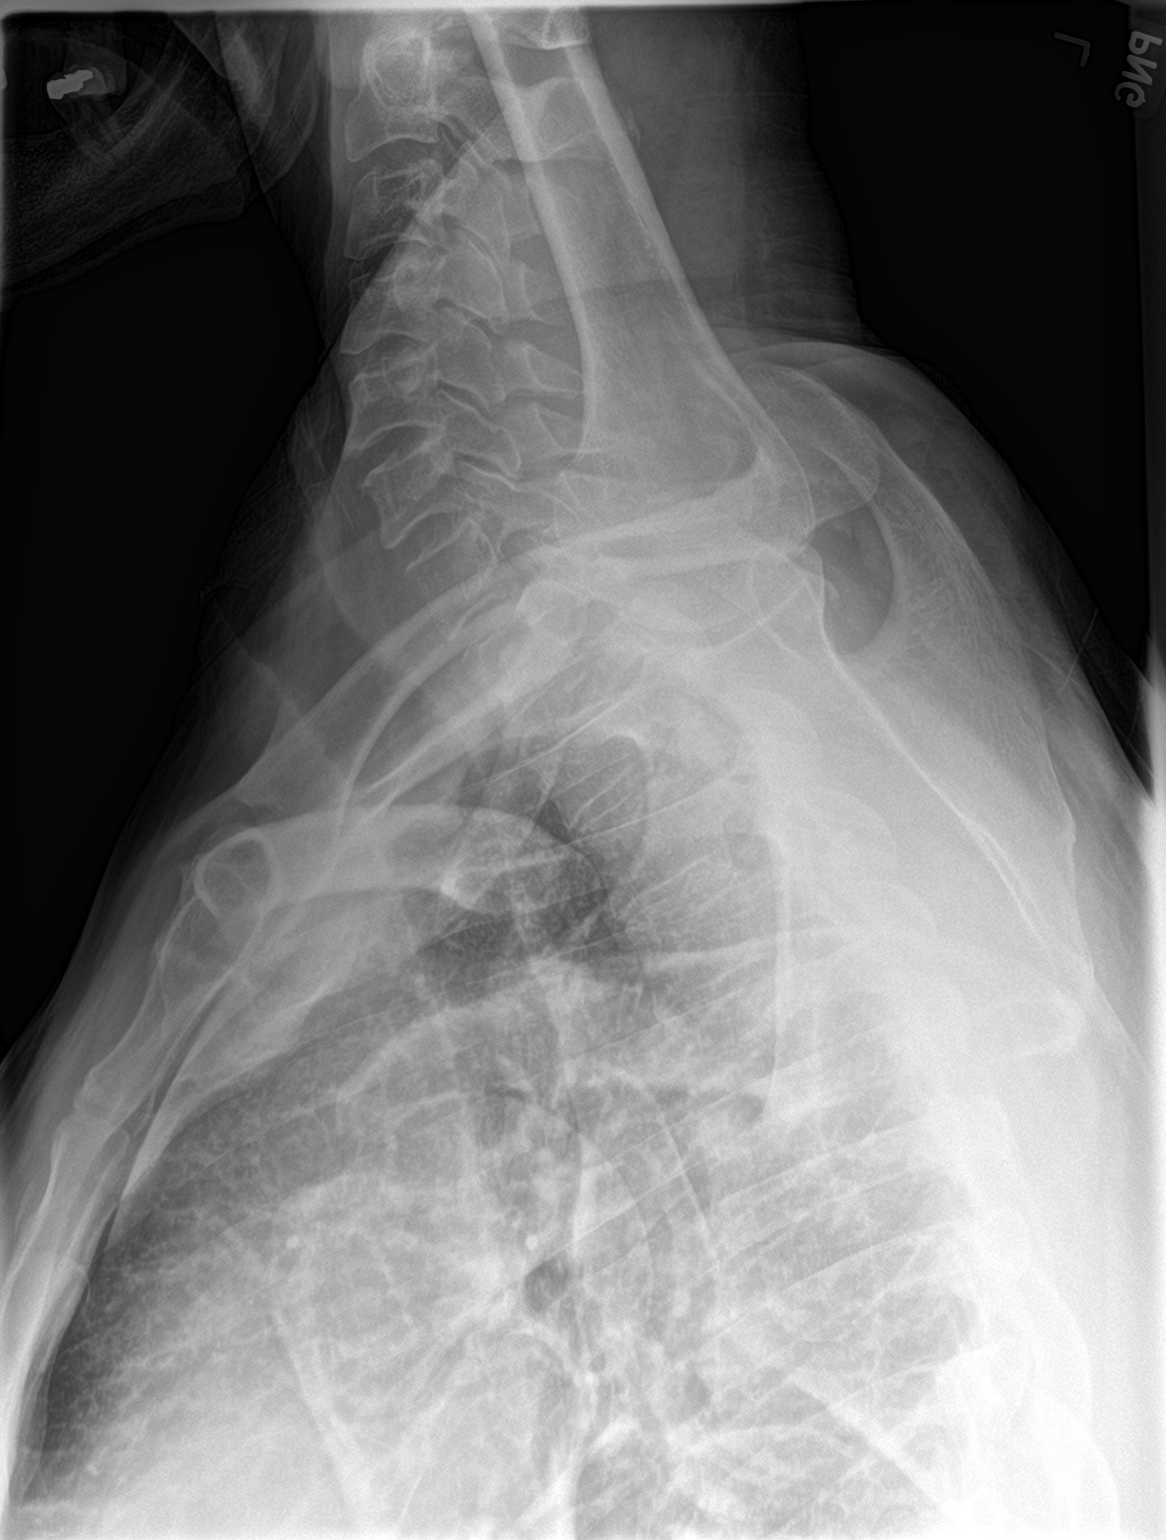

[3 of 3 positions shown; findings below may reference images not displayed]

FINDINGS: Mild thoracic curvature convex to the left with the apex in the
lower thoracic region. No abnormal alignment on the lateral
projection. Mild mid to lower thoracic disc space narrowing with
anterior and right lateral osteophytes. No advanced disc space
narrowing. No evidence of regional fracture. Posteromedial ribs in
visualized lungs appear normal.
IMPRESSION: Mild thoracic curvature convex to the left. Mild mid to lower
thoracic degenerative disc disease.

## 2021-05-09 ENCOUNTER — Telehealth: Payer: Self-pay | Admitting: Internal Medicine

## 2021-05-09 NOTE — Telephone Encounter (Signed)
Patient called in to scheduled yearly physical 08.19.22 @ 7:50am  Wants to know if labs are needed   If so please let patient know so he can get those done [phone]

## 2021-05-12 NOTE — Telephone Encounter (Signed)
Called pt he states on Friday he went to see another provider because he was not feeling well. They done blood work, and he will bring th results to Dr. Alain Marion on 8/19th.Marland KitchenJohny Chess

## 2021-05-16 ENCOUNTER — Encounter: Payer: Self-pay | Admitting: Internal Medicine

## 2021-05-16 ENCOUNTER — Other Ambulatory Visit: Payer: Self-pay

## 2021-05-16 ENCOUNTER — Ambulatory Visit (INDEPENDENT_AMBULATORY_CARE_PROVIDER_SITE_OTHER): Payer: 59 | Admitting: Internal Medicine

## 2021-05-16 VITALS — BP 122/82 | HR 75 | Temp 97.9°F | Ht 75.0 in | Wt 262.0 lb

## 2021-05-16 DIAGNOSIS — F4321 Adjustment disorder with depressed mood: Secondary | ICD-10-CM

## 2021-05-16 DIAGNOSIS — R519 Headache, unspecified: Secondary | ICD-10-CM | POA: Insufficient documentation

## 2021-05-16 DIAGNOSIS — R509 Fever, unspecified: Secondary | ICD-10-CM

## 2021-05-16 DIAGNOSIS — G4485 Primary stabbing headache: Secondary | ICD-10-CM | POA: Diagnosis not present

## 2021-05-16 DIAGNOSIS — R42 Dizziness and giddiness: Secondary | ICD-10-CM | POA: Diagnosis not present

## 2021-05-16 DIAGNOSIS — H9312 Tinnitus, left ear: Secondary | ICD-10-CM | POA: Diagnosis not present

## 2021-05-16 DIAGNOSIS — D494 Neoplasm of unspecified behavior of bladder: Secondary | ICD-10-CM

## 2021-05-16 DIAGNOSIS — E559 Vitamin D deficiency, unspecified: Secondary | ICD-10-CM

## 2021-05-16 DIAGNOSIS — Z634 Disappearance and death of family member: Secondary | ICD-10-CM

## 2021-05-16 DIAGNOSIS — C689 Malignant neoplasm of urinary organ, unspecified: Secondary | ICD-10-CM

## 2021-05-16 DIAGNOSIS — S46211S Strain of muscle, fascia and tendon of other parts of biceps, right arm, sequela: Secondary | ICD-10-CM

## 2021-05-16 DIAGNOSIS — R5382 Chronic fatigue, unspecified: Secondary | ICD-10-CM

## 2021-05-16 LAB — URINALYSIS
Bilirubin Urine: NEGATIVE
Ketones, ur: NEGATIVE
Leukocytes,Ua: NEGATIVE
Nitrite: NEGATIVE
Specific Gravity, Urine: 1.02 (ref 1.000–1.030)
Total Protein, Urine: NEGATIVE
Urine Glucose: NEGATIVE
Urobilinogen, UA: 0.2 (ref 0.0–1.0)
pH: 6 (ref 5.0–8.0)

## 2021-05-16 MED ORDER — METHYLPREDNISOLONE 4 MG PO TBPK: ORAL_TABLET | ORAL | 0 refills | Status: DC

## 2021-05-16 MED ORDER — LORATADINE 10 MG PO TABS: 10.0000 mg | ORAL_TABLET | Freq: Every day | ORAL | 1 refills | Status: DC

## 2021-05-16 MED ORDER — RABEPRAZOLE SODIUM 20 MG PO TBEC: 20.0000 mg | DELAYED_RELEASE_TABLET | Freq: Every day | ORAL | 5 refills | Status: DC

## 2021-05-16 MED ORDER — AMOXICILLIN-POT CLAVULANATE 875-125 MG PO TABS: 1.0000 | ORAL_TABLET | Freq: Two times a day (BID) | ORAL | 0 refills | Status: DC

## 2021-05-16 NOTE — Progress Notes (Addendum)
Subjective:  Patient ID: Christian Kennedy, male    DOB: [birth year]  Age: 55 y.o. MRN: [id number]  CC: Annual Exam   HPI Coran E Richison presents for L ear high pitch sound very severe at times, chills, weakness, dizziness x w 2wk. she was not able to work for 2 weeks.  He felt better yesterday. The symptoms started when he was working in the garden -it was very hot.  He got stung by an insect. T was 101 for 2 days, he felt dizzy.  He is not vaccinated for COVID. He did not do a COVID test. He saw 4 practitioners in the past 2 weeks.  He was treated with prednisone for 3 days.  His labs that were done recently (blood chemistry and blood count) were ok per patient. He is complaining of recurrent stabbing headaches on the left side C/o chronic tinnitus in the left ear-4 years off and on. No hearing loss.... He is complaining of GERD.  Previously PPIs caused joint aches  It is a complex situation   Outpatient Medications Prior to Visit  Medication Sig Dispense Refill   RABEprazole (ACIPHEX) 20 MG tablet TAKE ONE TABLET BY MOUTH ONE TIME DAILY 30 tablet 5   B Complex-C (SUPER B COMPLEX PO) Take by mouth.     Cholecalciferol (VITAMIN D3) 2000 units capsule Take 1 capsule (2,000 Units total) by mouth daily. 100 capsule 3   meloxicam (MOBIC) 15 MG tablet Take 1 tablet (15 mg total) by mouth daily. (Patient not taking: Reported on 05/16/2021) 30 tablet 0   traMADol (ULTRAM) 50 MG tablet TAKE 1 TO 2 TABLETS BY MOUTH EVERY 6 HOURS AS NEEDED FOR SEVERE PAIN 60 tablet 0   No facility-administered medications prior to visit.    ROS: Review of Systems  Constitutional:  Positive for chills, fatigue and fever. Negative for appetite change and unexpected weight change.  HENT:  Positive for congestion, ear pain, sinus pressure, sinus pain and sore throat. Negative for dental problem, ear discharge, facial swelling, hearing loss, nosebleeds, rhinorrhea, sneezing, trouble swallowing and voice change.    Eyes:  Negative for itching and visual disturbance.  Respiratory:  Negative for cough and shortness of breath.   Cardiovascular:  Negative for chest pain, palpitations and leg swelling.  Gastrointestinal:  Negative for abdominal distention, blood in stool, diarrhea and nausea.  Genitourinary:  Negative for frequency and hematuria.  Musculoskeletal:  Negative for back pain, gait problem, joint swelling and neck pain.  Skin:  Negative for rash.  Neurological:  Negative for dizziness, tremors, speech difficulty and weakness.  Psychiatric/Behavioral:  Negative for agitation, dysphoric mood and sleep disturbance. The patient is not nervous/anxious.    Objective:  BP 122/82   Pulse 75   Temp 97.9 F (36.6 C) (Oral)   Ht '6\' 3"'$  (1.905 m)   Wt 262 lb (118.8 kg)   SpO2 98%   BMI 32.75 kg/m   BP Readings from Last 3 Encounters:  05/16/21 122/82  04/11/20 (!) 146/83  04/10/20 (!) 134/91    Wt Readings from Last 3 Encounters:  05/16/21 262 lb (118.8 kg)  04/11/20 274 lb 6 oz (124.5 kg)  09/16/18 265 lb (120.2 kg)    Physical Exam Constitutional:      General: He is not in acute distress.    Appearance: Normal appearance. He is well-developed. He is not ill-appearing, toxic-appearing or diaphoretic.     Comments: NAD  HENT:     Head: Normocephalic.  Right Ear: Tympanic membrane, ear canal and external ear normal.     Left Ear: Tympanic membrane, ear canal and external ear normal.     Nose: Congestion present.  Eyes:     Conjunctiva/sclera: Conjunctivae normal.     Pupils: Pupils are equal, round, and reactive to light.  Neck:     Thyroid: No thyromegaly.     Vascular: No JVD.  Cardiovascular:     Rate and Rhythm: Normal rate and regular rhythm.     Heart sounds: Normal heart sounds. No murmur heard.   No friction rub. No gallop.  Pulmonary:     Effort: Pulmonary effort is normal. No respiratory distress.     Breath sounds: Normal breath sounds. No wheezing or rales.   Chest:     Chest wall: No tenderness.  Abdominal:     General: Bowel sounds are normal. There is no distension.     Palpations: Abdomen is soft. There is no mass.     Tenderness: There is no abdominal tenderness. There is no guarding or rebound.  Musculoskeletal:        General: No tenderness. Normal range of motion.     Cervical back: Normal range of motion.     Right lower leg: No edema.     Left lower leg: No edema.  Lymphadenopathy:     Cervical: No cervical adenopathy.  Skin:    General: Skin is warm and dry.     Findings: No rash.  Neurological:     Mental Status: He is alert and oriented to person, place, and time.     Cranial Nerves: No cranial nerve deficit.     Motor: No weakness or abnormal muscle tone.     Coordination: Coordination normal.     Gait: Gait normal.     Deep Tendon Reflexes: Reflexes are normal and symmetric.  Psychiatric:        Behavior: Behavior normal.        Thought Content: Thought content normal.        Judgment: Judgment normal.   CN2-12 WNL B TMs wnl Romberg (-) B No rash on the scalp, neck, face ? Sebaceous cyst on the scalp 1 cm, nontender Torn proximal right biceps -indentation present MS OK  Lab Results  Component Value Date   WBC 6.9 07/21/2018   HGB 16.0 07/21/2018   HCT 46.3 07/21/2018   PLT 188.0 07/21/2018   GLUCOSE 94 11/02/2018   CHOL 194 09/15/2017   TRIG 237.0 (H) 09/15/2017   HDL 36.10 (L) 09/15/2017   LDLDIRECT 148.0 09/15/2017   ALT 11 07/21/2018   AST 8 07/21/2018   NA 138 11/02/2018   K 4.6 11/02/2018   CL 103 11/02/2018   CREATININE 0.83 11/02/2018   BUN 19 11/02/2018   CO2 26 11/02/2018   TSH 0.61 09/15/2017   PSA 1.30 09/15/2017   INR 1.02 11/20/2017    No results found.  Assessment & Plan:     Follow-up: No follow-ups on file.  Walker Kehr, MD

## 2021-05-16 NOTE — Assessment & Plan Note (Addendum)
Unclear etiology.  Some of his symptoms are consistent with trigeminal neuralgia.  Rule out sinusitis, arthritis, abscess CT head ordered Empiric abx-Augmentin Medrol pack prescribed Claritin daily ENT ref

## 2021-05-16 NOTE — Assessment & Plan Note (Signed)
He has been taking vitamin D daily

## 2021-05-16 NOTE — Assessment & Plan Note (Signed)
The patient refused to follow-up with urology in the past.  We will check a urine test today

## 2021-05-16 NOTE — Addendum Note (Signed)
Addended by: Cassandria Anger on: 05/16/2021 08:44 AM   Modules accepted: Level of Service

## 2021-05-16 NOTE — Assessment & Plan Note (Addendum)
Chronic and recurrent- worse lately.  ENT consultation.  Will obtain CT of the head Medrol Dosepak prescribed  Claritin daily

## 2021-05-16 NOTE — Assessment & Plan Note (Signed)
Torn proximal right biceps -indentation present - Dr Marlou Sa

## 2021-05-16 NOTE — Assessment & Plan Note (Addendum)
Recurrent.  ENT referral was made Do not drive if dizzy Medrol Dosepak

## 2021-05-16 NOTE — Assessment & Plan Note (Signed)
Labs were ok 

## 2021-05-28 ENCOUNTER — Other Ambulatory Visit: Payer: Self-pay

## 2021-05-28 ENCOUNTER — Ambulatory Visit (INDEPENDENT_AMBULATORY_CARE_PROVIDER_SITE_OTHER)
Admission: RE | Admit: 2021-05-28 | Discharge: 2021-05-28 | Disposition: A | Discharge status: Home / Self Care | Payer: 59 | Source: Ambulatory Visit | Attending: Internal Medicine | Admitting: Internal Medicine

## 2021-05-28 DIAGNOSIS — R509 Fever, unspecified: Secondary | ICD-10-CM

## 2021-05-28 DIAGNOSIS — G4485 Primary stabbing headache: Secondary | ICD-10-CM

## 2021-10-08 ENCOUNTER — Other Ambulatory Visit: Payer: Self-pay

## 2021-10-08 ENCOUNTER — Ambulatory Visit (INDEPENDENT_AMBULATORY_CARE_PROVIDER_SITE_OTHER): Payer: 59 | Admitting: Internal Medicine

## 2021-10-08 ENCOUNTER — Encounter: Payer: Self-pay | Admitting: Internal Medicine

## 2021-10-08 VITALS — BP 128/78 | HR 80 | Temp 98.2°F | Ht 75.0 in | Wt 264.2 lb

## 2021-10-08 DIAGNOSIS — Z0000 Encounter for general adult medical examination without abnormal findings: Secondary | ICD-10-CM | POA: Diagnosis not present

## 2021-10-08 DIAGNOSIS — R300 Dysuria: Secondary | ICD-10-CM | POA: Diagnosis not present

## 2021-10-08 DIAGNOSIS — D494 Neoplasm of unspecified behavior of bladder: Secondary | ICD-10-CM

## 2021-10-08 LAB — CBC WITH DIFFERENTIAL/PLATELET
Basophils Absolute: 0 10*3/uL (ref 0.0–0.1)
Basophils Relative: 0.4 % (ref 0.0–3.0)
Eosinophils Absolute: 0.2 10*3/uL (ref 0.0–0.7)
Eosinophils Relative: 2.4 % (ref 0.0–5.0)
HCT: 47.3 % (ref 39.0–52.0)
Hemoglobin: 16 g/dL (ref 13.0–17.0)
Lymphocytes Relative: 30.8 % (ref 12.0–46.0)
Lymphs Abs: 2.6 10*3/uL (ref 0.7–4.0)
MCHC: 33.8 g/dL (ref 30.0–36.0)
MCV: 89.9 fl (ref 78.0–100.0)
Monocytes Absolute: 0.6 10*3/uL (ref 0.1–1.0)
Monocytes Relative: 7.4 % (ref 3.0–12.0)
Neutro Abs: 4.9 10*3/uL (ref 1.4–7.7)
Neutrophils Relative %: 59 % (ref 43.0–77.0)
Platelets: 196 10*3/uL (ref 150.0–400.0)
RBC: 5.26 Mil/uL (ref 4.22–5.81)
RDW: 13 % (ref 11.5–15.5)
WBC: 8.3 10*3/uL (ref 4.0–10.5)

## 2021-10-08 LAB — URINALYSIS, ROUTINE W REFLEX MICROSCOPIC
Bilirubin Urine: NEGATIVE
Ketones, ur: NEGATIVE
Leukocytes,Ua: NEGATIVE
Nitrite: NEGATIVE
Specific Gravity, Urine: 1.01 (ref 1.000–1.030)
Total Protein, Urine: NEGATIVE
Urine Glucose: NEGATIVE
Urobilinogen, UA: 0.2 (ref 0.0–1.0)
pH: 6 (ref 5.0–8.0)

## 2021-10-08 LAB — COMPREHENSIVE METABOLIC PANEL
ALT: 17 U/L (ref 0–53)
AST: 14 U/L (ref 0–37)
Albumin: 4.9 g/dL (ref 3.5–5.2)
Alkaline Phosphatase: 58 U/L (ref 39–117)
BUN: 9 mg/dL (ref 6–23)
CO2: 29 mEq/L (ref 19–32)
Calcium: 10.1 mg/dL (ref 8.4–10.5)
Chloride: 104 mEq/L (ref 96–112)
Creatinine, Ser: 1 mg/dL (ref 0.40–1.50)
GFR: 84.1 mL/min (ref >60.00)
Glucose, Bld: 88 mg/dL (ref 70–99)
Potassium: 5 mEq/L (ref 3.5–5.1)
Sodium: 139 mEq/L (ref 135–145)
Total Bilirubin: 0.8 mg/dL (ref 0.2–1.2)
Total Protein: 7.4 g/dL (ref 6.0–8.3)

## 2021-10-08 LAB — LIPID PANEL
Cholesterol: 200 mg/dL (ref 0–200)
HDL: 34.6 mg/dL — ABNORMAL LOW (ref >39.00)
NonHDL: 165.47
Total CHOL/HDL Ratio: 6
Triglycerides: 248 mg/dL — ABNORMAL HIGH (ref 0.0–149.0)
VLDL: 49.6 mg/dL — ABNORMAL HIGH (ref 0.0–40.0)

## 2021-10-08 LAB — LDL CHOLESTEROL, DIRECT: Direct LDL: 132 mg/dL

## 2021-10-08 LAB — TSH: TSH: 0.74 u[IU]/mL (ref 0.35–5.50)

## 2021-10-08 LAB — PSA: PSA: 1.18 ng/mL (ref 0.10–4.00)

## 2021-10-08 MED ORDER — CIPROFLOXACIN HCL 500 MG PO TABS: 500.0000 mg | ORAL_TABLET | Freq: Two times a day (BID) | ORAL | 1 refills | Status: AC

## 2021-10-08 NOTE — Progress Notes (Signed)
Subjective:  Patient ID: Christian Kennedy, male    DOB: 1965/11/22  Age: 56 y.o. MRN: [id number]  CC: Abdominal Pain   HPI [name] presents for urinary burning since Aug - Sept.  He saw blood in the urine once, urine was cloudy once or twice.  He was using herbal teas to treat himself. He is currently trying to lose weight on a low-carb diet.   Outpatient Medications Prior to Visit  Medication Sig Dispense Refill   loratadine (CLARITIN) 10 MG tablet Take 1 tablet (10 mg total) by mouth daily. 90 tablet 1   RABEprazole (ACIPHEX) 20 MG tablet Take 1 tablet (20 mg total) by mouth daily. 30 tablet 5   amoxicillin-clavulanate (AUGMENTIN) 875-125 MG tablet Take 1 tablet by mouth 2 (two) times daily. (Patient not taking: Reported on 10/08/2021) 20 tablet 0   B Complex-C (SUPER B COMPLEX PO) Take by mouth.     Cholecalciferol (VITAMIN D3) 2000 units capsule Take 1 capsule (2,000 Units total) by mouth daily. 100 capsule 3   methylPREDNISolone (MEDROL DOSEPAK) 4 MG TBPK tablet As directed (Patient not taking: Reported on 10/08/2021) 21 tablet 0   No facility-administered medications prior to visit.    ROS: Review of Systems  Constitutional:  Positive for unexpected weight change. Negative for appetite change and fatigue.  HENT:  Negative for congestion, nosebleeds, sneezing, sore throat and trouble swallowing.   Eyes:  Negative for itching and visual disturbance.  Respiratory:  Negative for cough.   Cardiovascular:  Negative for chest pain, palpitations and leg swelling.  Gastrointestinal:  Positive for abdominal pain. Negative for abdominal distention, blood in stool, diarrhea, nausea and vomiting.  Genitourinary:  Positive for dysuria and urgency. Negative for decreased urine volume, difficulty urinating, frequency, hematuria and scrotal swelling.  Musculoskeletal:  Negative for back pain, gait problem, joint swelling and neck pain.  Skin:  Negative for rash.  Neurological:   Negative for dizziness, tremors, speech difficulty and weakness.  Psychiatric/Behavioral:  Negative for agitation, dysphoric mood and sleep disturbance. The patient is not nervous/anxious.    Objective:  BP 128/78 (BP Location: Left Arm)    Pulse 80    Temp 98.2 F (36.8 C) (Oral)    Ht 6\' 3"  (1.905 m)    Wt 264 lb 3.2 oz (119.8 kg)    SpO2 97%    BMI 33.02 kg/m   BP Readings from Last 3 Encounters:  10/08/21 128/78  05/16/21 122/82  04/11/20 (!) 146/83    Wt Readings from Last 3 Encounters:  10/08/21 264 lb 3.2 oz (119.8 kg)  05/16/21 262 lb (118.8 kg)  04/11/20 274 lb 6 oz (124.5 kg)    Physical Exam Constitutional:      General: He is not in acute distress.    Appearance: He is well-developed.     Comments: NAD  Eyes:     Conjunctiva/sclera: Conjunctivae normal.     Pupils: Pupils are equal, round, and reactive to light.  Neck:     Thyroid: No thyromegaly.     Vascular: No JVD.  Cardiovascular:     Rate and Rhythm: Normal rate and regular rhythm.     Heart sounds: Normal heart sounds. No murmur heard.   No friction rub. No gallop.  Pulmonary:     Effort: Pulmonary effort is normal. No respiratory distress.     Breath sounds: Normal breath sounds. No wheezing or rales.  Chest:     Chest wall: No tenderness.  Abdominal:  General: Bowel sounds are normal. There is no distension.     Palpations: Abdomen is soft. There is no mass.     Tenderness: There is no abdominal tenderness. There is no guarding or rebound.  Musculoskeletal:        General: No tenderness. Normal range of motion.     Cervical back: Normal range of motion.  Lymphadenopathy:     Cervical: No cervical adenopathy.  Skin:    General: Skin is warm and dry.     Findings: No rash.  Neurological:     Mental Status: He is alert and oriented to person, place, and time.     Cranial Nerves: No cranial nerve deficit.     Motor: No abnormal muscle tone.     Coordination: Coordination normal.     Gait:  Gait normal.     Deep Tendon Reflexes: Reflexes are normal and symmetric.  Psychiatric:        Behavior: Behavior normal.        Thought Content: Thought content normal.        Judgment: Judgment normal.   Abdomen is soft nontender at the moment, no mass Lab Results  Component Value Date   WBC 6.9 07/21/2018   HGB 16.0 07/21/2018   HCT 46.3 07/21/2018   PLT 188.0 07/21/2018   GLUCOSE 94 11/02/2018   CHOL 194 09/15/2017   TRIG 237.0 (H) 09/15/2017   HDL 36.10 (L) 09/15/2017   LDLDIRECT 148.0 09/15/2017   ALT 11 07/21/2018   AST 8 07/21/2018   NA 138 11/02/2018   K 4.6 11/02/2018   CL 103 11/02/2018   CREATININE 0.83 11/02/2018   BUN 19 11/02/2018   CO2 26 11/02/2018   TSH 0.61 09/15/2017   PSA 1.30 09/15/2017   INR 1.02 11/20/2017    CT HEAD WO CONTRAST (5MM)  Result Date: 05/29/2021 CLINICAL DATA:  Dizziness, fever, tinnitus EXAM: CT HEAD WITHOUT CONTRAST TECHNIQUE: Contiguous axial images were obtained from the base of the skull through the vertex without intravenous contrast. COMPARISON:  CT head 06/01/2006 FINDINGS: Brain: No evidence of acute infarction, hemorrhage, hydrocephalus, extra-axial collection or mass lesion/mass effect. Mild deep white matter hypodensities have developed since the prior study and are likely chronic. Vascular: Negative for hyperdense vessel Skull: Negative Sinuses/Orbits: Negative Other: None IMPRESSION: No acute abnormality. Mild patchy white matter hypodensity most likely chronic microvascular ischemia. Electronically Signed   By: Franchot Gallo M.D.   On: 05/29/2021 14:38    Assessment & Plan:   Problem List Items Addressed This Visit     Bladder tumor    CT 12/19 4.6 cm soft tissue mass within the urinary bladder, arising in the region of the right trigone, with likely involvement of the distal right ureter. Findings are highly suspicious for primary transitional cell carcinoma. Intramural urinary bladder hematoma may have a similar  appearance but is considered less likely.  High-grade, muscle invasive urothelial carcinoma of the bladder - he was operated on by Dr Lovena Neighbours. Pt wants to see someone else with [facility] The patient will let me know who he would like to see with [facility].  I think he needs to go soon as possible.        Dysuria    Possible urinary tract infection versus other  Check UA  Empiric Cipro       Relevant Orders   Urinalysis   Well adult exam - Primary   Relevant Orders   TSH   Urinalysis  CBC with Differential/Platelet   Lipid panel   Comprehensive metabolic panel   PSA      Meds ordered this encounter  Medications   ciprofloxacin (CIPRO) 500 MG tablet    Sig: Take 1 tablet (500 mg total) by mouth 2 (two) times daily for 10 days.    Dispense:  20 tablet    Refill:  1      Follow-up: Return in about 2 weeks (around 10/22/2021) for a follow-up visit.  Walker Kehr, MD

## 2021-10-08 NOTE — Assessment & Plan Note (Addendum)
CT 12/19 4.6 cm soft tissue mass within the urinary bladder, arising in the region of the right trigone, with likely involvement of the distal right ureter. Findings are highly suspicious for primary transitional cell carcinoma. Intramural urinary bladder hematoma may have a similar appearance but is considered less likely.  High-grade, muscle invasive urothelial carcinoma of the bladder - he was operated on by Dr Lovena Neighbours. Pt wants to see someone else with [facility] The patient will let me know who he would like to see with [facility].  I think he needs to go soon as possible.

## 2021-10-08 NOTE — Assessment & Plan Note (Addendum)
Possible urinary tract infection versus other  Check UA  Empiric Cipro

## 2021-10-08 NOTE — Patient Instructions (Signed)
The Obesity Code book by Sharman Cheek   These suggestions will probably help you to improve your metabolism if you are not overweight and to lose weight if you are overweight: 1.  Reduce your consumption of sugars and starches.  Eliminate high fructose corn syrup from your diet.  Reduce your consumption of processed foods.  For desserts try to have seasonal fruits, berries, nuts, cheeses or dark chocolate with more than 70% cacao. 2.  Do not snack 3.  You do not have to eat breakfast.  If you choose to have breakfast - eat plain greek yogurt, eggs, oatmeal (without sugar) - use honey if you need to. 4.  Drink water, freshly brewed unsweetened tea (green, black or herbal) or coffee.  Do not drink sodas including diet sodas , juices, beverages sweetened with artificial sweeteners. 5.  Reduce your consumption of refined grains. 6.  Avoid protein drinks such as Optifast, Slim fast etc. Eat chicken, fish, meat, dairy and beans for your sources of protein. 7.  Natural unprocessed fats like cold pressed virgin olive oil, butter, coconut oil are good for you.  Eat avocados. 8.  Increase your consumption of fiber.  Fruits, berries, vegetables, whole grains, flaxseed, chia seeds, beans, popcorn, nuts, oatmeal are good sources of fiber 9.  Use vinegar in your diet, i.e. apple cider vinegar, red wine or balsamic vinegar 10.  You can try fasting.  For example you can skip breakfast and lunch every other day (24-hour fast) 11.  Stress reduction, good night sleep, relaxation, meditation, yoga and other physical activity is likely to help you to maintain low weight too. 12.  If you drink alcohol, limit your alcohol intake to no more than 2 drinks a day.

## 2021-11-03 DIAGNOSIS — S0990XA Unspecified injury of head, initial encounter: Secondary | ICD-10-CM | POA: Insufficient documentation

## 2021-11-08 ENCOUNTER — Other Ambulatory Visit: Payer: Self-pay | Admitting: Internal Medicine

## 2021-11-13 ENCOUNTER — Other Ambulatory Visit: Payer: Self-pay

## 2021-11-13 ENCOUNTER — Ambulatory Visit (INDEPENDENT_AMBULATORY_CARE_PROVIDER_SITE_OTHER): Payer: 59 | Admitting: Internal Medicine

## 2021-11-13 ENCOUNTER — Encounter: Payer: Self-pay | Admitting: Internal Medicine

## 2021-11-13 VITALS — BP 130/82 | HR 81 | Temp 98.1°F | Ht 75.0 in | Wt 264.0 lb

## 2021-11-13 DIAGNOSIS — R300 Dysuria: Secondary | ICD-10-CM

## 2021-11-13 DIAGNOSIS — S060X0D Concussion without loss of consciousness, subsequent encounter: Secondary | ICD-10-CM | POA: Diagnosis not present

## 2021-11-13 DIAGNOSIS — D494 Neoplasm of unspecified behavior of bladder: Secondary | ICD-10-CM

## 2021-11-13 DIAGNOSIS — G4485 Primary stabbing headache: Secondary | ICD-10-CM

## 2021-11-13 DIAGNOSIS — F4310 Post-traumatic stress disorder, unspecified: Secondary | ICD-10-CM

## 2021-11-13 DIAGNOSIS — S060XAA Concussion with loss of consciousness status unknown, initial encounter: Secondary | ICD-10-CM | POA: Insufficient documentation

## 2021-11-13 DIAGNOSIS — C689 Malignant neoplasm of urinary organ, unspecified: Secondary | ICD-10-CM

## 2021-11-13 MED ORDER — CIPROFLOXACIN HCL 500 MG PO TABS: 500.0000 mg | ORAL_TABLET | Freq: Two times a day (BID) | ORAL | 0 refills | Status: DC

## 2021-11-13 NOTE — Assessment & Plan Note (Signed)
[place], son's death Discussed. Pt refused treatment

## 2021-11-13 NOTE — Patient Instructions (Signed)
Concussion, Adult A concussion is a brain injury from a hard, direct hit (trauma) to the head or body. This direct hit causes the brain to shake quickly back and forth inside the skull. This can damage brain cells and cause chemical changes in the brain. A concussion may also be known as a mild traumatic brain injury (TBI). Concussions are usually not life-threatening, but the effects of a concussion can be serious. If you have a concussion, you should be very careful to avoid having a second concussion. What are the causes? This condition is caused by: A direct hit to your head, such as: Running into another player during a game. Being hit in a fight. Hitting your head on a hard surface. Sudden movement of your body that causes your brain to move back and forth inside the skull, such as in a car crash. What are the signs or symptoms? The signs of a concussion can be hard to notice. Early on, they may be missed by you, family members, and health care providers. You may look fine on the outside but may act or feel differently. Every head injury is different. Symptoms are usually temporary but may last for days, weeks, or even months. Some symptoms appear right away, but other symptoms may not show up for hours or days. If your symptoms last longer than normal, you may have post-concussion syndrome. Physical symptoms Headaches. Dizziness and problems with coordination or balance. Sensitivity to light or noise. Nausea or vomiting. Tiredness (fatigue). Vision or hearing problems. Changes in eating or sleeping patterns. Seizure. Mental and emotional symptoms Irritability or mood changes. Memory problems. Trouble concentrating, organizing, or making decisions. Slowness in thinking, acting or reacting, speaking, or reading. Anxiety or depression. How is this diagnosed? This condition is diagnosed based on: Your symptoms. A description of your injury. You may also have tests,  including: Imaging tests, such as a CT scan or an MRI. Neuropsychological tests. These measure your thinking, understanding, learning, and remembering abilities. How is this treated? Treatment for this condition includes: Stopping sports or activity if you are injured. If you hit your head or show signs of concussion: Do not return to sports or activities the same day. Get checked by a health care provider before you return to your activities. Physical and mental rest and careful observation, usually at home. Gradually return to your normal activities. Medicines to help with symptoms such as headaches, nausea, or difficulty sleeping. Avoid taking opioid pain medicine while recovering from a concussion. Avoiding alcohol and drugs. These may slow your recovery and can put you at risk of further injury. Referral to a concussion clinic or rehabilitation center. Recovery from a concussion can take time. How fast you recover depends on many factors. Return to activities only when: Your symptoms are completely gone. Your health care provider says that it is safe. Follow these instructions at home: Activity Limit activities that require a lot of thought or concentration, such as: Doing homework or job-related work. Watching TV. Working on the computer or phone. Playing memory games and puzzles. Rest. Rest helps your brain heal. Make sure you: Get plenty of sleep. Most adults should get 7-9 hours of sleep each night. Rest during the day. Take naps or rest breaks when you feel tired. Avoid physical activity like exercise until your health care provider says it is safe. Stop any activity that worsens symptoms. Do not do high-risk activities that could cause a second concussion, such as riding a bike or playing sports.  Ask your health care provider when you can return to your normal activities, such as school, work, athletics, and driving. Your ability to react may be slower after a brain injury.  Never do these activities if you are dizzy. Your health care provider will likely give you a plan for gradually returning to activities. General instructions  Take over-the-counter and prescription medicines only as told by your health care provider. Some medicines, such as blood thinners (anticoagulants) and aspirin, may increase the risk for complications, such as bleeding. Do not drink alcohol until your health care provider says you can. Watch your symptoms and tell others around you to do the same. Complications sometimes occur after a concussion. Older adults with a brain injury may have a higher risk of serious complications. Tell your work Freight forwarder, teachers, Government social research officer, school counselor, coach, or Product/process development scientist about your injury, symptoms, and restrictions. Keep all follow-up visits as told by your health care provider. This is important. How is this prevented? Avoiding another brain injury is very important. In rare cases, another injury can lead to permanent brain damage, brain swelling, or death. The risk of this is greatest during the first 7-10 days after a head injury. Avoid injuries by: Stopping activities that could lead to a second concussion, such as contact or recreational sports, until your health care provider says it is okay. Taking these actions once you have returned to sports or activities: Avoiding plays or moves that can cause you to crash into another person. This is how most concussions occur. Following the rules and being respectful of other players. Do not engage in violent or illegal plays. Getting regular exercise that includes strength and balance training. Wearing a properly fitting helmet during sports, biking, or other activities. Helmets can help protect you from serious skull and brain injuries, but they may not protect you from a concussion. Even when wearing a helmet, you should avoid being hit in the head. Contact a health care provider if: Your  symptoms do not improve. You have new symptoms. You have another injury. Get help right away if: You have new or worsening physical symptoms, such as: A severe or worsening headache. Weakness or numbness in any part of your body, slurred speech, vision changes, or confusion. Your coordination gets worse. Vomiting repeatedly. You have a seizure. You have unusual behavior changes. You lose consciousness, are sleepier than normal, or are difficult to wake up. These symptoms may represent a serious problem that is an emergency. Do not wait to see if the symptoms will go away. Get medical help right away. Call your local emergency services (911 in the [place] Do not drive yourself to the hospital. Summary A concussion is a brain injury that results from a hard, direct hit (trauma) to your head or body. You may have imaging tests and neuropsychological tests to diagnose a concussion. Treatment for this condition includes physical and mental rest and careful observation. Ask your health care provider when you can return to your normal activities, such as school, work, athletics, and driving. Get help right away if you have a severe headache, weakness in any part of the body, seizures, behavior changes, changes in vision, or if you are confused or sleepier than normal. This information is not intended to replace advice given to you by your health care provider. Make sure you discuss any questions you have with your health care provider. Document Revised: 11/28/2020 Document Reviewed: 11/28/2020 Elsevier Patient Education  [employer].

## 2021-11-13 NOTE — Assessment & Plan Note (Signed)
Recent concussion

## 2021-11-13 NOTE — Assessment & Plan Note (Addendum)
Discussed Christian Kennedy is reluctant to go back to [facility] for treatment.  Will refer to have him see Dr. Thomasene Mohair in [facility].  He is in agreement now. Urol ref

## 2021-11-13 NOTE — Assessment & Plan Note (Signed)
Christian Kennedy is reluctant to go back to [facility] for treatment.  Will refer to have him see Dr. Thomasene Mohair in [facility].  He is in agreement now.

## 2021-11-13 NOTE — Assessment & Plan Note (Signed)
Resolving after Cipro

## 2021-11-13 NOTE — Progress Notes (Signed)
Subjective:  Patient ID: Christian Kennedy, male    DOB: [birth year]  Age: 56 y.o. MRN: [id number]  CC: No chief complaint on file.   HPI Christian Kennedy presents for UTI, back pain, hematuria - better after Cipro  C/o R forehead laceration - 3 stitches 10 d ago . A metal part hit him. No LOC. Pt had HA and nausea - resolved. It is a workman's comp case.  Outpatient Medications Prior to Visit  Medication Sig Dispense Refill   loratadine (CLARITIN) 10 MG tablet Take 1 tablet (10 mg total) by mouth daily. 90 tablet 1   RABEprazole (ACIPHEX) 20 MG tablet Take 1 tablet by mouth once daily 90 tablet 3   No facility-administered medications prior to visit.    ROS: Review of Systems  Constitutional:  Negative for appetite change, fatigue, fever and unexpected weight change.  HENT:  Negative for congestion, nosebleeds, sneezing, sore throat and trouble swallowing.   Eyes:  Negative for itching and visual disturbance.  Respiratory:  Negative for cough.   Cardiovascular:  Negative for chest pain, palpitations and leg swelling.  Gastrointestinal:  Negative for abdominal distention, blood in stool, diarrhea and nausea.  Genitourinary:  Negative for decreased urine volume, difficulty urinating, dysuria, flank pain, frequency, hematuria, penile swelling, scrotal swelling, testicular pain and urgency.  Musculoskeletal:  Negative for back pain, gait problem, joint swelling and neck pain.  Skin:  Negative for rash.  Neurological:  Negative for dizziness, tremors, speech difficulty and weakness.  Psychiatric/Behavioral:  Negative for agitation, dysphoric mood and sleep disturbance. The patient is not nervous/anxious.    Objective:  BP 130/82 (BP Location: Left Arm, Patient Position: Sitting, Cuff Size: Large)    Pulse 81    Temp 98.1 F (36.7 C) (Oral)    Ht 6\' 3"  (1.905 m)    Wt 264 lb (119.7 kg)    SpO2 98%    BMI 33.00 kg/m   BP Readings from Last 3 Encounters:  11/13/21 130/82  10/08/21  128/78  05/16/21 122/82    Wt Readings from Last 3 Encounters:  11/13/21 264 lb (119.7 kg)  10/08/21 264 lb 3.2 oz (119.8 kg)  05/16/21 262 lb (118.8 kg)    Physical Exam Constitutional:      General: He is not in acute distress.    Appearance: He is well-developed.     Comments: NAD  Eyes:     Conjunctiva/sclera: Conjunctivae normal.     Pupils: Pupils are equal, round, and reactive to light.  Neck:     Thyroid: No thyromegaly.     Vascular: No JVD.  Cardiovascular:     Rate and Rhythm: Normal rate and regular rhythm.     Heart sounds: Normal heart sounds. No murmur heard.   No friction rub. No gallop.  Pulmonary:     Effort: Pulmonary effort is normal. No respiratory distress.     Breath sounds: Normal breath sounds. No wheezing or rales.  Chest:     Chest wall: No tenderness.  Abdominal:     General: Bowel sounds are normal. There is no distension.     Palpations: Abdomen is soft. There is no mass.     Tenderness: There is no abdominal tenderness. There is no guarding or rebound.  Musculoskeletal:        General: No tenderness. Normal range of motion.     Cervical back: Normal range of motion.  Lymphadenopathy:     Cervical: No cervical adenopathy.  Skin:  General: Skin is warm and dry.     Findings: No rash.  Neurological:     Mental Status: He is alert and oriented to person, place, and time.     Cranial Nerves: No cranial nerve deficit.     Motor: No abnormal muscle tone.     Coordination: Coordination normal.     Gait: Gait normal.     Deep Tendon Reflexes: Reflexes are normal and symmetric.  Psychiatric:        Behavior: Behavior normal.        Thought Content: Thought content normal.        Judgment: Judgment normal.   Procedure-suture removal 3 sutures were removed from the right upper forehead under sterile conditions.  Tolerated well complications none   A total time of 45 minutes was spent preparing to see the patient, reviewing tests, x-rays,  operative reports and other medical records.  Also, obtaining history and performing comprehensive physical exam.  Additionally, counseling the patient regarding the above listed issues.   Finally, documenting clinical information in the health records, coordination of care, educating the patient, filling out CDL form, removing sutures. It is a complex case.   Lab Results  Component Value Date   WBC 8.3 10/08/2021   HGB 16.0 10/08/2021   HCT 47.3 10/08/2021   PLT 196.0 10/08/2021   GLUCOSE 88 10/08/2021   CHOL 200 10/08/2021   TRIG 248.0 (H) 10/08/2021   HDL 34.60 (L) 10/08/2021   LDLDIRECT 132.0 10/08/2021   ALT 17 10/08/2021   AST 14 10/08/2021   NA 139 10/08/2021   K 5.0 10/08/2021   CL 104 10/08/2021   CREATININE 1.00 10/08/2021   BUN 9 10/08/2021   CO2 29 10/08/2021   TSH 0.74 10/08/2021   PSA 1.18 10/08/2021   INR 1.02 11/20/2017    CT HEAD WO CONTRAST (5MM)  Result Date: 05/29/2021 CLINICAL DATA:  Dizziness, fever, tinnitus EXAM: CT HEAD WITHOUT CONTRAST TECHNIQUE: Contiguous axial images were obtained from the base of the skull through the vertex without intravenous contrast. COMPARISON:  CT head 06/01/2006 FINDINGS: Brain: No evidence of acute infarction, hemorrhage, hydrocephalus, extra-axial collection or mass lesion/mass effect. Mild deep white matter hypodensities have developed since the prior study and are likely chronic. Vascular: Negative for hyperdense vessel Skull: Negative Sinuses/Orbits: Negative Other: None IMPRESSION: No acute abnormality. Mild patchy white matter hypodensity most likely chronic microvascular ischemia. Electronically Signed   By: Franchot Gallo M.D.   On: 05/29/2021 14:38    Assessment & Plan:   Problem List Items Addressed This Visit     Bladder tumor    Discussed Christian Kennedy is reluctant to go back to [facility] for treatment.  Will refer to have him see Dr. Thomasene Mohair in [facility].  He is in agreement now. Urol ref      Relevant  Orders   Ambulatory referral to Urology   Concussion    3 sutures removed CT if needed per [facility] comp doctor       Dysuria - Primary    Resolving after Cipro      Relevant Orders   Urinalysis   PTSD (post-traumatic stress disorder)    [place], son's death Discussed. Pt refused treatment      Urothelial cancer ([place])    Christian Kennedy is reluctant to go back to [facility] for treatment.  Will refer to have him see Dr. Thomasene Mohair in [facility].  He is in agreement now.      Relevant  Medications   ciprofloxacin (CIPRO) 500 MG tablet      Meds ordered this encounter  Medications   ciprofloxacin (CIPRO) 500 MG tablet    Sig: Take 1 tablet (500 mg total) by mouth 2 (two) times daily.    Dispense:  20 tablet    Refill:  0      Follow-up: Return in about 3 months (around 02/10/2022) for a follow-up visit.  Walker Kehr, MD

## 2021-11-13 NOTE — Assessment & Plan Note (Addendum)
Head injury with a metal rod, accidental.  3 sutures removed from the forehead.  The patient had several days of headaches and nausea.  They seem to have resolved by now. CT if needed per [facility] comp doctor

## 2021-12-17 ENCOUNTER — Telehealth: Payer: Self-pay | Admitting: Internal Medicine

## 2021-12-17 NOTE — Telephone Encounter (Signed)
Pts daughter states provider agreed to accept pts sister as a new pt ? ?Please advise ? ?Phone [phone] ?

## 2021-12-19 NOTE — Telephone Encounter (Signed)
Okay.  Thanks.

## 2021-12-22 ENCOUNTER — Telehealth: Payer: Self-pay | Admitting: Internal Medicine

## 2021-12-22 NOTE — Telephone Encounter (Signed)
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 States MD said he would print it out and he has not heard anything.  ? ?Pt also states he has not spoken with anyone regarding referral.   ? ?Informed pt that referral has been sent over and he should wait for that office to call him regarding scheduling.  ? ? ?

## 2021-12-23 ENCOUNTER — Other Ambulatory Visit: Payer: Self-pay | Admitting: Internal Medicine

## 2021-12-24 ENCOUNTER — Other Ambulatory Visit: Payer: Self-pay

## 2021-12-24 MED ORDER — LORATADINE 10 MG PO TABS: 10.0000 mg | ORAL_TABLET | Freq: Every day | ORAL | 1 refills | Status: DC

## 2021-12-24 NOTE — Telephone Encounter (Signed)
Christian Kennedy, ?If you recall, you printed me the last page of the form probably 4 weeks ago.  You were not able to print the full form.  The certificate was on the last page that I filled out and signed.  I think you called the patient to pick it up.  This signed page should be somewhere in the office.  Could be in the outgoing basket?  Hopefully he does not need the full form to be filled out. ?Thank you ?

## 2022-01-05 NOTE — Addendum Note (Signed)
Addended by: Earnstine Regal on: 01/05/2022 10:53 AM ? ? Modules accepted: Orders ? ?

## 2022-03-18 ENCOUNTER — Ambulatory Visit (INDEPENDENT_AMBULATORY_CARE_PROVIDER_SITE_OTHER): Payer: 59 | Admitting: Internal Medicine

## 2022-03-18 ENCOUNTER — Encounter: Payer: Self-pay | Admitting: Internal Medicine

## 2022-03-18 VITALS — BP 140/74 | HR 68 | Temp 98.0°F | Ht 75.0 in | Wt 254.0 lb

## 2022-03-18 DIAGNOSIS — F17200 Nicotine dependence, unspecified, uncomplicated: Secondary | ICD-10-CM

## 2022-03-18 DIAGNOSIS — R1030 Lower abdominal pain, unspecified: Secondary | ICD-10-CM | POA: Diagnosis not present

## 2022-03-18 DIAGNOSIS — R300 Dysuria: Secondary | ICD-10-CM | POA: Diagnosis not present

## 2022-03-18 DIAGNOSIS — C689 Malignant neoplasm of urinary organ, unspecified: Secondary | ICD-10-CM | POA: Diagnosis not present

## 2022-03-18 DIAGNOSIS — N390 Urinary tract infection, site not specified: Secondary | ICD-10-CM | POA: Diagnosis not present

## 2022-03-18 LAB — CBC WITH DIFFERENTIAL/PLATELET
Basophils Absolute: 0 10*3/uL (ref 0.0–0.1)
Basophils Relative: 0.5 % (ref 0.0–3.0)
Eosinophils Absolute: 0.1 10*3/uL (ref 0.0–0.7)
Eosinophils Relative: 1.7 % (ref 0.0–5.0)
HCT: 45.2 % (ref 39.0–52.0)
Hemoglobin: 15.4 g/dL (ref 13.0–17.0)
Lymphocytes Relative: 25.9 % (ref 12.0–46.0)
Lymphs Abs: 2 10*3/uL (ref 0.7–4.0)
MCHC: 34 g/dL (ref 30.0–36.0)
MCV: 90.4 fl (ref 78.0–100.0)
Monocytes Absolute: 0.5 10*3/uL (ref 0.1–1.0)
Monocytes Relative: 6.7 % (ref 3.0–12.0)
Neutro Abs: 4.9 10*3/uL (ref 1.4–7.7)
Neutrophils Relative %: 65.2 % (ref 43.0–77.0)
Platelets: 193 10*3/uL (ref 150.0–400.0)
RBC: 5 Mil/uL (ref 4.22–5.81)
RDW: 13.5 % (ref 11.5–15.5)
WBC: 7.6 10*3/uL (ref 4.0–10.5)

## 2022-03-18 LAB — URINALYSIS, ROUTINE W REFLEX MICROSCOPIC
Bilirubin Urine: NEGATIVE
Ketones, ur: NEGATIVE
Leukocytes,Ua: NEGATIVE
Nitrite: NEGATIVE
Specific Gravity, Urine: 1.01 (ref 1.000–1.030)
Total Protein, Urine: NEGATIVE
Urine Glucose: NEGATIVE
Urobilinogen, UA: 0.2 (ref 0.0–1.0)
pH: 7.5 (ref 5.0–8.0)

## 2022-03-18 LAB — HEPATIC FUNCTION PANEL
ALT: 11 U/L (ref 0–53)
AST: 11 U/L (ref 0–37)
Albumin: 4.4 g/dL (ref 3.5–5.2)
Alkaline Phosphatase: 61 U/L (ref 39–117)
Bilirubin, Direct: 0.1 mg/dL (ref 0.0–0.3)
Total Bilirubin: 0.6 mg/dL (ref 0.2–1.2)
Total Protein: 7.5 g/dL (ref 6.0–8.3)

## 2022-03-18 LAB — BASIC METABOLIC PANEL
BUN: 15 mg/dL (ref 6–23)
CO2: 30 mEq/L (ref 19–32)
Calcium: 9.8 mg/dL (ref 8.4–10.5)
Chloride: 103 mEq/L (ref 96–112)
Creatinine, Ser: 1.1 mg/dL (ref 0.40–1.50)
GFR: 74.78 mL/min (ref >60.00)
Glucose, Bld: 94 mg/dL (ref 70–99)
Potassium: 4.5 mEq/L (ref 3.5–5.1)
Sodium: 139 mEq/L (ref 135–145)

## 2022-03-18 LAB — LIPASE: Lipase: 62 U/L — ABNORMAL HIGH (ref 11.0–59.0)

## 2022-03-18 MED ORDER — AMOXICILLIN-POT CLAVULANATE 875-125 MG PO TABS: 1.0000 | ORAL_TABLET | Freq: Two times a day (BID) | ORAL | 0 refills | Status: DC

## 2022-03-18 MED ORDER — CEFTRIAXONE SODIUM 1 G IJ SOLR
1.0000 g | Freq: Once | INTRAMUSCULAR | Status: AC
  Administered 2022-03-18: 1 g via INTRAMUSCULAR

## 2022-03-18 NOTE — Patient Instructions (Addendum)
You had the antibiotic shot today (rocephin)  Ok to STOP the Cipro  Please take all new medication as prescribed - the different antibiotic (augmentin)  Please continue all other medications as before, and refills have been done if requested.  Please have the pharmacy call with any other refills you may need.  Please keep your appointments with your specialists as you may have planned  You will be contacted regarding the referral for: Urology   Please go to the LAB at the blood drawing area for the tests to be done - the urine and blood testing today  We could consider CT abdomen and pelvis if you are not better in 3-5 days  You will be contacted by phone if any changes need to be made immediately.  Otherwise, you will receive a letter about your results with an explanation, but please check with MyChart first.  Please remember to sign up for MyChart if you have not done so, as this will be important to you in the future with finding out test results, communicating by private email, and scheduling acute appointments online when needed.  Please see Dr Alain Marion if not improving

## 2022-03-18 NOTE — Progress Notes (Unsigned)
Patient ID: Christian Kennedy, male   DOB: 01/25/1966, 56 y.o.   MRN: [id number]        Chief Complaint: follow up HTN, HLD and hyperglycemia ***       HPI:  Christian Kennedy is a 56 y.o. male here with hx of urothelial cancer 2019, neg CT abd/pelvis after this for chronic        Wt Readings from Last 3 Encounters:  03/18/22 254 lb (115.2 kg)  11/13/21 264 lb (119.7 kg)  10/08/21 264 lb 3.2 oz (119.8 kg)   BP Readings from Last 3 Encounters:  03/18/22 140/74  11/13/21 130/82  10/08/21 128/78         Past Medical History:  Diagnosis Date   Abscess    Right buttocks   Bladder tumor    Depression    Son passed 2014   Ear infection    Eating disorder    Frequent headaches    GERD (gastroesophageal reflux disease)    Helicobacter pylori ab+ 10/27/2017   Hepatitis    childhood, jaundice   Migraines    Urinary incontinence    Varicose vein of leg    Right leg   Vitamin D deficiency    Past Surgical History:  Procedure Laterality Date   CYSTOSCOPY W/ URETERAL STENT PLACEMENT Right 11/19/2017   Procedure: CYSTOSCOPY WITH RETROGRADE PYELOGRAM/URETERAL STENT PLACEMENT;  Surgeon: Ceasar Mons, MD;  Location: [facility];  Service: Urology;  Laterality: Right;   CYSTOSCOPY WITH FULGERATION N/A 11/19/2017   Procedure: [place] AND CLOT EVACUATION;  Surgeon: Nickie Retort, MD;  Location: [facility];  Service: Urology;  Laterality: N/A;   TRANSURETHRAL RESECTION OF BLADDER TUMOR N/A 11/19/2017   Procedure: TRANSURETHRAL RESECTION OF BLADDER TUMOR (TURBT);  Surgeon: Ceasar Mons, MD;  Location: [facility];  Service: Urology;  Laterality: N/A;    reports that he has been smoking cigarettes. He has a 30.00 pack-year smoking history. He has never used smokeless tobacco. He reports current alcohol use. He reports that he does not use drugs. family history includes Arthritis in his father; Cancer in his mother;  Depression in his daughter, father, and mother; Early death in his son; Hearing loss in his father. No Known Allergies Current Outpatient Medications on File Prior to Visit  Medication Sig Dispense Refill   ciprofloxacin (CIPRO) 500 MG tablet Take 1 tablet (500 mg total) by mouth 2 (two) times daily. 20 tablet 0   loratadine (CLARITIN) 10 MG tablet Take 1 tablet (10 mg total) by mouth daily. 90 tablet 1   RABEprazole (ACIPHEX) 20 MG tablet Take 1 tablet by mouth once daily 90 tablet 3   No current facility-administered medications on file prior to visit.        ROS:  All others reviewed and negative.  Objective        PE:  BP 140/74 (BP Location: Left Arm, Patient Position: Sitting, Cuff Size: Large)   Pulse 68   Temp 98 F (36.7 C) (Oral)   Ht '6\' 3"'$  (1.905 m)   Wt 254 lb (115.2 kg)   SpO2 98%   BMI 31.75 kg/m                 Constitutional: Pt appears in NAD               HENT: Head: NCAT.                Right Ear:  External ear normal.                 Left Ear: External ear normal.                Eyes: . Pupils are equal, round, and reactive to light. Conjunctivae and EOM are normal               Nose: without d/c or deformity               Neck: Neck supple. Gross normal ROM               Cardiovascular: Normal rate and regular rhythm.                 Pulmonary/Chest: Effort normal and breath sounds without rales or wheezing.                Abd:  Soft, NT, ND, + BS, no organomegaly               Neurological: Pt is alert. At baseline orientation, motor grossly intact               Skin: Skin is warm. No rashes, no other new lesions, LE edema - ***               Psychiatric: Pt behavior is normal without agitation   Micro: none  Cardiac tracings I have personally interpreted today:  none  Pertinent Radiological findings (summarize): none   Lab Results  Component Value Date   WBC 8.3 10/08/2021   HGB 16.0 10/08/2021   HCT 47.3 10/08/2021   PLT 196.0 10/08/2021    GLUCOSE 88 10/08/2021   CHOL 200 10/08/2021   TRIG 248.0 (H) 10/08/2021   HDL 34.60 (L) 10/08/2021   LDLDIRECT 132.0 10/08/2021   ALT 17 10/08/2021   AST 14 10/08/2021   NA 139 10/08/2021   K 5.0 10/08/2021   CL 104 10/08/2021   CREATININE 1.00 10/08/2021   BUN 9 10/08/2021   CO2 29 10/08/2021   TSH 0.74 10/08/2021   PSA 1.18 10/08/2021   INR 1.02 11/20/2017   Assessment/Plan:  Christian Kennedy is a 56 y.o. White or Caucasian [1] male with  has a past medical history of Abscess, Bladder tumor, Depression, Ear infection, Eating disorder, Frequent headaches, GERD (gastroesophageal reflux disease), Helicobacter pylori ab+ (10/27/2017), Hepatitis, Migraines, Urinary incontinence, Varicose vein of leg, and Vitamin D deficiency.  No problem-specific Assessment & Plan notes found for this encounter.  Followup: No follow-ups on file.  Cathlean Cower, MD 03/18/2022 9:00 [facility]

## 2022-03-19 LAB — URINE CULTURE: Result:: NO GROWTH

## 2022-03-22 ENCOUNTER — Encounter: Payer: Self-pay | Admitting: Internal Medicine

## 2022-03-22 DIAGNOSIS — R1030 Lower abdominal pain, unspecified: Secondary | ICD-10-CM | POA: Insufficient documentation

## 2022-03-22 DIAGNOSIS — F17200 Nicotine dependence, unspecified, uncomplicated: Secondary | ICD-10-CM | POA: Insufficient documentation

## 2022-03-23 ENCOUNTER — Encounter: Payer: Self-pay | Admitting: Internal Medicine

## 2022-03-23 ENCOUNTER — Ambulatory Visit (INDEPENDENT_AMBULATORY_CARE_PROVIDER_SITE_OTHER): Payer: 59 | Admitting: Internal Medicine

## 2022-03-23 VITALS — BP 142/78 | HR 80 | Temp 97.5°F | Ht 75.0 in | Wt 256.0 lb

## 2022-03-23 DIAGNOSIS — R300 Dysuria: Secondary | ICD-10-CM

## 2022-03-23 DIAGNOSIS — F4321 Adjustment disorder with depressed mood: Secondary | ICD-10-CM

## 2022-03-23 DIAGNOSIS — D494 Neoplasm of unspecified behavior of bladder: Secondary | ICD-10-CM | POA: Diagnosis not present

## 2022-03-23 DIAGNOSIS — Z634 Disappearance and death of family member: Secondary | ICD-10-CM | POA: Diagnosis not present

## 2022-03-23 MED ORDER — OXYBUTYNIN CHLORIDE 5 MG PO TABS: 5.0000 mg | ORAL_TABLET | Freq: Three times a day (TID) | ORAL | 3 refills | Status: DC | PRN

## 2022-03-23 MED ORDER — PHENAZOPYRIDINE HCL 100 MG PO TABS: 100.0000 mg | ORAL_TABLET | Freq: Three times a day (TID) | ORAL | 1 refills | Status: DC | PRN

## 2022-03-23 NOTE — Assessment & Plan Note (Signed)
Son died 9 years ago in 2014 - discussed Reactive depression. Micahel has adjusted, never recovered

## 2022-03-26 ENCOUNTER — Encounter: Payer: Self-pay | Admitting: *Deleted

## 2022-04-13 ENCOUNTER — Encounter: Payer: Self-pay | Admitting: Urology

## 2022-04-13 ENCOUNTER — Ambulatory Visit (INDEPENDENT_AMBULATORY_CARE_PROVIDER_SITE_OTHER): Payer: 59 | Admitting: Urology

## 2022-04-13 VITALS — BP 142/81 | HR 86 | Ht 75.0 in | Wt 240.0 lb

## 2022-04-13 DIAGNOSIS — Z8551 Personal history of malignant neoplasm of bladder: Secondary | ICD-10-CM | POA: Diagnosis not present

## 2022-04-13 DIAGNOSIS — R3129 Other microscopic hematuria: Secondary | ICD-10-CM | POA: Diagnosis not present

## 2022-04-13 LAB — URINALYSIS, ROUTINE W REFLEX MICROSCOPIC
Bilirubin, UA: NEGATIVE
Glucose, UA: NEGATIVE
Ketones, UA: NEGATIVE
Nitrite, UA: POSITIVE — AB
Protein,UA: NEGATIVE
Specific Gravity, UA: 1.01 (ref 1.005–1.030)
Urobilinogen, Ur: 0.2 mg/dL (ref 0.2–1.0)
pH, UA: 6 (ref 5.0–7.5)

## 2022-04-13 LAB — MICROSCOPIC EXAMINATION: Renal Epithel, UA: NONE SEEN /hpf

## 2022-04-13 NOTE — Progress Notes (Signed)
04/13/2022 10:13 AM   Christian Kennedy [birth year] [id number]  Referring provider: Cassandria Anger, MD [address] [place],  [place] [address]  No chief complaint on file.   HPI:  New pt -   1) history of bladder cancer-patient had high-grade T2 disease of the right bladder wall status postresection in February 2019.  This was with Dr. Lovena Neighbours at [facility] urology.  He did not follow-up.  TUR: February 2019-high-grade T2 disease-right bladder wall, abutted right UO which was stented.  Stent removed in the office.  Staging: February 2019 CT abdomen and pelvis-right bladder wall tumor, no pathologic nodes but some asymmetric nodes along the right ureter and right iliac chain November 2019-no metastatic disease.  No bladder mass.  Mild BPH.  January 2023 PSA 1.8.  Today, Christian Kennedy is seen for the above.  He has had some recent dysuria. No gross hematuria. UA June 2023 showed 11-20 red blood cells.  Culture was negative.  He was treated with Augmentin.  His creatinine was 1.1.  He is a [employer] trader.    PMH: Past Medical History:  Diagnosis Date   Abscess    Right buttocks   Bladder tumor    Depression    Son passed 2014   Ear infection    Eating disorder    Frequent headaches    GERD (gastroesophageal reflux disease)    Helicobacter pylori ab+ 10/27/2017   Hepatitis    childhood, jaundice   Migraines    Urinary incontinence    Varicose vein of leg    Right leg   Vitamin D deficiency     Surgical History: Past Surgical History:  Procedure Laterality Date   CYSTOSCOPY W/ URETERAL STENT PLACEMENT Right 11/19/2017   Procedure: CYSTOSCOPY WITH RETROGRADE PYELOGRAM/URETERAL STENT PLACEMENT;  Surgeon: Ceasar Mons, MD;  Location: [facility];  Service: Urology;  Laterality: Right;   CYSTOSCOPY WITH FULGERATION N/A 11/19/2017   Procedure: [place] AND CLOT EVACUATION;  Surgeon: Nickie Retort, MD;   Location: [facility];  Service: Urology;  Laterality: N/A;   TRANSURETHRAL RESECTION OF BLADDER TUMOR N/A 11/19/2017   Procedure: TRANSURETHRAL RESECTION OF BLADDER TUMOR (TURBT);  Surgeon: Ceasar Mons, MD;  Location: [facility];  Service: Urology;  Laterality: N/A;    Home Medications:  Allergies as of 04/13/2022   No Known Allergies      Medication List        Accurate as of April 13, 2022 10:13 AM. If you have any questions, ask your nurse or doctor.          amoxicillin-clavulanate 875-125 MG tablet Commonly known as: AUGMENTIN Take 1 tablet by mouth 2 (two) times daily.   loratadine 10 MG tablet Commonly known as: CLARITIN Take 1 tablet (10 mg total) by mouth daily.   oxybutynin 5 MG tablet Commonly known as: DITROPAN Take 1 tablet (5 mg total) by mouth 3 (three) times daily as needed for bladder spasms.   phenazopyridine 100 MG tablet Commonly known as: PYRIDIUM Take 1 tablet (100 mg total) by mouth 3 (three) times daily as needed for pain.   RABEprazole 20 MG tablet Commonly known as: ACIPHEX Take 1 tablet by mouth once daily        Allergies: No Known Allergies  Family History: Family History  Problem Relation Age of Onset   Cancer Mother    Depression Mother    Arthritis Father    Depression Father  Hearing loss Father    Depression Daughter    Early death Son     Social History:  reports that he has been smoking cigarettes. He has a 30.00 pack-year smoking history. He has never used smokeless tobacco. He reports current alcohol use. He reports that he does not use drugs.   Physical Exam: BP (!) 142/81   Pulse 86   Ht '6\' 3"'$  (1.905 m)   Wt 240 lb (108.9 kg)   BMI 30.00 kg/m   Constitutional:  Alert and oriented, No acute distress. HEENT: [place] AT, moist mucus membranes.  Trachea midline, no masses. Cardiovascular: No clubbing, cyanosis, or edema. Respiratory: Normal respiratory effort, no increased work of  breathing. GI: Abdomen is soft, nontender, nondistended, no abdominal masses GU: No CVA tenderness Skin: No rashes, bruises or suspicious lesions. Neurologic: Grossly intact, no focal deficits, moving all 4 extremities. Psychiatric: Normal mood and affect.  Laboratory Data: Lab Results  Component Value Date   WBC 7.6 03/18/2022   HGB 15.4 03/18/2022   HCT 45.2 03/18/2022   MCV 90.4 03/18/2022   PLT 193.0 03/18/2022    Lab Results  Component Value Date   CREATININE 1.10 03/18/2022    Lab Results  Component Value Date   PSA 1.18 10/08/2021   PSA 1.30 09/15/2017    Lab Results  Component Value Date   TESTOSTERONE 261.70 (L) 09/15/2017    No results found for: "HGBA1C"  Urinalysis    Component Value Date/Time   COLORURINE YELLOW 03/18/2022 0934   APPEARANCEUR Sl Cloudy (A) 03/18/2022 0934   LABSPEC 1.010 03/18/2022 0934   PHURINE 7.5 03/18/2022 0934   GLUCOSEU NEGATIVE 03/18/2022 0934   HGBUR LARGE (A) 03/18/2022 0934   BILIRUBINUR NEGATIVE 03/18/2022 0934   KETONESUR NEGATIVE 03/18/2022 0934   UROBILINOGEN 0.2 03/18/2022 0934   NITRITE NEGATIVE 03/18/2022 0934   LEUKOCYTESUR NEGATIVE 03/18/2022 0934    No results found for: "LABMICR", "WBCUA", "RBCUA", "LABEPIT", "MUCUS", "BACTERIA"  Pertinent Imaging: CT from 2019 x 2  Dr Lovena Neighbours notes from [facility]:    H/o HG T2 bladder ca - disc his stage, grade and prognosis and they high risk and life threatening nature of the disease. I recommended a CT scan a/p and a cystoscopy.   Hematuria - as above.  - Urinalysis, Routine w reflex microscopic   No follow-ups on file.  Festus Aloe, MD  [facility]  [address] [place], [place] [address] [phone]

## 2022-04-15 ENCOUNTER — Encounter: Payer: Self-pay | Admitting: Internal Medicine

## 2022-04-15 LAB — URINE CULTURE: Organism ID, Bacteria: NO GROWTH

## 2022-04-17 ENCOUNTER — Emergency Department (HOSPITAL_COMMUNITY): Payer: 59

## 2022-04-17 ENCOUNTER — Encounter (HOSPITAL_COMMUNITY): Payer: Self-pay | Admitting: Emergency Medicine

## 2022-04-17 ENCOUNTER — Other Ambulatory Visit: Payer: Self-pay | Admitting: Internal Medicine

## 2022-04-17 ENCOUNTER — Other Ambulatory Visit: Payer: Self-pay

## 2022-04-17 ENCOUNTER — Emergency Department (HOSPITAL_COMMUNITY)
Admission: EM | Admit: 2022-04-17 | Discharge: 2022-04-17 | Discharge status: Against Medical Advice | Payer: 59 | Attending: Emergency Medicine | Admitting: Emergency Medicine

## 2022-04-17 ENCOUNTER — Emergency Department (HOSPITAL_COMMUNITY)
Admission: EM | Admit: 2022-04-17 | Discharge: 2022-04-17 | Disposition: A | Discharge status: Home / Self Care | Payer: 59 | Source: Home / Self Care | Attending: Emergency Medicine | Admitting: Emergency Medicine

## 2022-04-17 DIAGNOSIS — D494 Neoplasm of unspecified behavior of bladder: Secondary | ICD-10-CM

## 2022-04-17 DIAGNOSIS — Z5321 Procedure and treatment not carried out due to patient leaving prior to being seen by health care provider: Secondary | ICD-10-CM | POA: Diagnosis not present

## 2022-04-17 DIAGNOSIS — R319 Hematuria, unspecified: Secondary | ICD-10-CM | POA: Insufficient documentation

## 2022-04-17 DIAGNOSIS — Z8551 Personal history of malignant neoplasm of bladder: Secondary | ICD-10-CM | POA: Insufficient documentation

## 2022-04-17 DIAGNOSIS — N1339 Other hydronephrosis: Secondary | ICD-10-CM

## 2022-04-17 LAB — CBC WITH DIFFERENTIAL/PLATELET
Abs Immature Granulocytes: 0.04 10*3/uL (ref 0.00–0.07)
Basophils Absolute: 0.1 10*3/uL (ref 0.0–0.1)
Basophils Relative: 0 %
Eosinophils Absolute: 0.1 10*3/uL (ref 0.0–0.5)
Eosinophils Relative: 1 %
HCT: 42.1 % (ref 39.0–52.0)
Hemoglobin: 14.2 g/dL (ref 13.0–17.0)
Immature Granulocytes: 0 %
Lymphocytes Relative: 14 %
Lymphs Abs: 1.7 10*3/uL (ref 0.7–4.0)
MCH: 30.2 pg (ref 26.0–34.0)
MCHC: 33.7 g/dL (ref 30.0–36.0)
MCV: 89.6 fL (ref 80.0–100.0)
Monocytes Absolute: 0.5 10*3/uL (ref 0.1–1.0)
Monocytes Relative: 4 %
Neutro Abs: 10 10*3/uL — ABNORMAL HIGH (ref 1.7–7.7)
Neutrophils Relative %: 81 %
Platelets: 207 10*3/uL (ref 150–400)
RBC: 4.7 MIL/uL (ref 4.22–5.81)
RDW: 12.7 % (ref 11.5–15.5)
WBC: 12.4 10*3/uL — ABNORMAL HIGH (ref 4.0–10.5)
nRBC: 0 % (ref 0.0–0.2)

## 2022-04-17 LAB — URINALYSIS, ROUTINE W REFLEX MICROSCOPIC
Bacteria, UA: NONE SEEN
Bilirubin Urine: NEGATIVE
Bilirubin Urine: NEGATIVE
Glucose, UA: NEGATIVE mg/dL
Glucose, UA: NEGATIVE mg/dL
Ketones, ur: NEGATIVE mg/dL
Ketones, ur: NEGATIVE mg/dL
Leukocytes,Ua: NEGATIVE
Leukocytes,Ua: NEGATIVE
Nitrite: NEGATIVE
Nitrite: NEGATIVE
Protein, ur: 100 mg/dL — AB
Protein, ur: NEGATIVE mg/dL
Specific Gravity, Urine: 1.002 — ABNORMAL LOW (ref 1.005–1.030)
Specific Gravity, Urine: 1.002 — ABNORMAL LOW (ref 1.005–1.030)
pH: 6 (ref 5.0–8.0)
pH: 6 (ref 5.0–8.0)

## 2022-04-17 LAB — COMPREHENSIVE METABOLIC PANEL
ALT: 14 U/L (ref 0–44)
AST: 12 U/L — ABNORMAL LOW (ref 15–41)
Albumin: 4.3 g/dL (ref 3.5–5.0)
Alkaline Phosphatase: 57 U/L (ref 38–126)
Anion gap: 8 (ref 5–15)
BUN: 16 mg/dL (ref 6–20)
CO2: 21 mmol/L — ABNORMAL LOW (ref 22–32)
Calcium: 9.1 mg/dL (ref 8.9–10.3)
Chloride: 106 mmol/L (ref 98–111)
Creatinine, Ser: 1.22 mg/dL (ref 0.61–1.24)
GFR, Estimated: >60 mL/min (ref >60)
Glucose, Bld: 98 mg/dL (ref 70–99)
Potassium: 3.9 mmol/L (ref 3.5–5.1)
Sodium: 135 mmol/L (ref 135–145)
Total Bilirubin: 0.7 mg/dL (ref 0.3–1.2)
Total Protein: 7.2 g/dL (ref 6.5–8.1)

## 2022-04-17 MED ORDER — NITROFURANTOIN MONOHYD MACRO 100 MG PO CAPS: 100.0000 mg | ORAL_CAPSULE | Freq: Two times a day (BID) | ORAL | 1 refills | Status: DC

## 2022-04-17 NOTE — ED Triage Notes (Signed)
Patient here with complaint of painless hematuria that occurred several hours ago today. Patient states he urinated again shortly before checking into ED and there was significantly less blood but he still wanted to have it evaluated. Patient is alert, oriented, ambulatory, and in no apparent distress at this time.

## 2022-04-17 NOTE — ED Provider Notes (Signed)
[facility] EMERGENCY DEPARTMENT Provider Note   CSN: [id number] Arrival date & time: 04/17/22  1745     History  Chief Complaint  Patient presents with   Hematuria    Christian Kennedy is a 56 y.o. male.   Hematuria   This patient is a 56 year old male, he had been seen earlier in the evening at another hospital but did not want to wait as there was an extended wait time.  He presents here with a complaint of what he thought was hematuria.  He stated that earlier in the day he had an episode of very dark-colored urine, he drank an excessive amount of water and this has cleared up and now looks back to normal.  He has had the occasional episode of dysuria, he has not had any significant abdominal discomfort and has no rectal pain, no fevers or chills, no nausea or vomiting, he is under the care of urology secondary to a history of bladder cancer in the past, recalls having a biopsy and a minor procedure to help with bleeding but had never undergone any formal resection chemotherapy or radiation according to his report.  He was scheduled to have a CT scan by his urologist this month but has not yet had it.    Home Medications Prior to Admission medications   Medication Sig Start Date End Date Taking? Authorizing Provider  oxybutynin (DITROPAN) 5 MG tablet Take 1 tablet (5 mg total) by mouth 3 (three) times daily as needed for bladder spasms. 03/23/22 03/23/23 Yes Plotnikov, Evie Lacks, MD  phenazopyridine (PYRIDIUM) 100 MG tablet Take 1 tablet (100 mg total) by mouth 3 (three) times daily as needed for pain. 03/23/22 03/23/23 Yes Plotnikov, Evie Lacks, MD  RABEprazole (ACIPHEX) 20 MG tablet Take 1 tablet by mouth once daily 11/10/21  Yes Plotnikov, Evie Lacks, MD  loratadine (CLARITIN) 10 MG tablet Take 1 tablet (10 mg total) by mouth daily. Patient not taking: Reported on 04/17/2022 12/24/21   Plotnikov, Evie Lacks, MD  nitrofurantoin, macrocrystal-monohydrate, (MACROBID) 100 MG capsule Take 1  capsule (100 mg total) by mouth 2 (two) times daily. 04/17/22   Plotnikov, Evie Lacks, MD      Allergies    Patient has no known allergies.    Review of Systems   Review of Systems  Genitourinary:  Positive for hematuria.  All other systems reviewed and are negative.   Physical Exam Updated Vital Signs BP (!) 141/82   Pulse 72   Temp 98.1 F (36.7 C) (Oral)   Resp 18   SpO2 100%  Physical Exam Vitals and nursing note reviewed.  Constitutional:      General: He is not in acute distress.    Appearance: He is well-developed.  HENT:     Head: Normocephalic and atraumatic.     Mouth/Throat:     Pharynx: No oropharyngeal exudate.  Eyes:     General: No scleral icterus.       Right eye: No discharge.        Left eye: No discharge.     Conjunctiva/sclera: Conjunctivae normal.     Pupils: Pupils are equal, round, and reactive to light.  Neck:     Thyroid: No thyromegaly.     Vascular: No JVD.  Cardiovascular:     Rate and Rhythm: Normal rate and regular rhythm.     Heart sounds: Normal heart sounds. No murmur heard.    No friction rub. No gallop.  Pulmonary:  Effort: Pulmonary effort is normal. No respiratory distress.     Breath sounds: Normal breath sounds. No wheezing or rales.  Abdominal:     General: Bowel sounds are normal. There is no distension.     Palpations: Abdomen is soft. There is no mass.     Tenderness: There is no abdominal tenderness.  Musculoskeletal:        General: No tenderness. Normal range of motion.     Cervical back: Normal range of motion and neck supple.  Lymphadenopathy:     Cervical: No cervical adenopathy.  Skin:    General: Skin is warm and dry.     Findings: No erythema or rash.  Neurological:     General: No focal deficit present.     Mental Status: He is alert.     Coordination: Coordination normal.  Psychiatric:        Behavior: Behavior normal.     ED Results / Procedures / Treatments   Labs (all labs ordered are listed,  but only abnormal results are displayed) Labs Reviewed  URINALYSIS, ROUTINE W REFLEX MICROSCOPIC - Abnormal; Notable for the following components:      Result Value   Color, Urine STRAW (*)    Specific Gravity, Urine 1.002 (*)    Hgb urine dipstick LARGE (*)    All other components within normal limits    EKG None  Radiology CT ABDOMEN PELVIS WO CONTRAST  Result Date: 04/17/2022 CLINICAL DATA:  Acute nonlocalized abdominal pain with hematuria. History of bladder cancer. EXAM: CT ABDOMEN AND PELVIS WITHOUT CONTRAST TECHNIQUE: Multidetector CT imaging of the abdomen and pelvis was performed following the standard protocol without IV contrast. RADIATION DOSE REDUCTION: This exam was performed according to the departmental dose-optimization program which includes automated exposure control, adjustment of the mA and/or kV according to patient size and/or use of iterative reconstruction technique. COMPARISON:  08/05/2018 FINDINGS: Lower chest: Lung bases are clear. Hepatobiliary: No focal liver abnormality is seen. No gallstones, gallbladder wall thickening, or biliary dilatation. Pancreas: Unremarkable. No pancreatic ductal dilatation or surrounding inflammatory changes. Spleen: Normal in size without focal abnormality. Adrenals/Urinary Tract: No adrenal gland nodules. Prominent right hydronephrosis and hydroureter. No ureteral stones are demonstrated but there is a polypoid mass in the right side of the bladder which is likely obstructing the ureter. This is new since the previous study. The bladder mass measures about 3.2 x 4.2 cm. Calcification in the left kidney measuring 5 mm diameter. No hydronephrosis on the left. Stomach/Bowel: Stomach is within normal limits. Appendix appears normal. No evidence of bowel wall thickening, distention, or inflammatory changes. Vascular/Lymphatic: Aortic atherosclerosis. No enlarged abdominal or pelvic lymph nodes. Reproductive: Prostate is unremarkable. Other: No  abdominal wall hernia or abnormality. No abdominopelvic ascites. Musculoskeletal: No acute or significant osseous findings. IMPRESSION: 1. 4.2 cm diameter polypoid mass in the right posterior bladder, developing since prior study and suspicious for recurrent bladder cancer. The mass is causing obstruction of the right ureter with severe proximal hydronephrosis. 2. Nonobstructing stone in the left kidney. Electronically Signed   By: Lucienne Capers M.D.   On: 04/17/2022 19:56    Procedures Procedures    Medications Ordered in ED Medications - No data to display  ED Course/ Medical Decision Making/ A&P                           Medical Decision Making Amount and/or Complexity of Data Reviewed Labs: ordered. Radiology: ordered.  This patient presents to the ED for concern of hospital hematuria differential diagnosis includes rhabdomyolysis though the patient has no complaints of muscular pain, myalgias, trauma, he is not feel dehydrated and has no systemic symptoms whatsoever    Additional history obtained:  Additional history obtained from electronic medical record External records from outside source obtained and reviewed including notes from the urology office from July 17 approximately 4 days ago where he was diagnosed with microhematuria, history recorded by urologist was a right bladder wall high-grade T2 disease status post resection in February 2019, he did not follow-up.  Prior stenting of the right ureter, this had been removed in the office, recent dysuria, no gross hematuria, urinalysis in June showed 11-20 red blood cells, culture negative, treated with Augmentin.   Lab Tests:  I Ordered, and personally interpreted labs.  The pertinent results include: Urinalysis showing no hematuria no signs of infection, culture had been sent at prior hospital earlier in the day   Imaging Studies ordered:  I ordered imaging studies including CT scan of the abdomen and pelvis I  independently visualized and interpreted imaging which showed an obstructing bladder tumor causing right-sided hydronephrosis and the ureter. I agree with the radiologist interpretation   Medicines ordered and prescription drug management:  Case was discussed with urologist Dr. Gloriann Loan, agrees with follow-up with Dr. Junious Silk in the office, secure chat messages sent to involved physicians Patient updated on findings I have reviewed the patients home medicines and have made adjustments as needed   Problem List / ED Course:  Patient stable, no signs of significant renal dysfunction or signs of urinary tract infection, comprehensive metabolic panel ordered earlier in the day showed creatinine of 1.2 and a BUN of 16 similar to prior values.   Social Determinants of Health:  Can follow-up in the next 1 to 2 weeks  I have discussed with the patient at the bedside the results, and the meaning of these results.  They have expressed her understanding to the need for follow-up with primary care physician           Final Clinical Impression(s) / ED Diagnoses Final diagnoses:  None    Rx / DC Orders ED Discharge Orders     None         Noemi Chapel, MD 04/17/22 2039

## 2022-04-17 NOTE — ED Notes (Signed)
Called pt x3 for vitals, no response. 

## 2022-04-17 NOTE — ED Notes (Signed)
Called pt for CT x5, no response.

## 2022-04-17 NOTE — Discharge Instructions (Signed)
Your CT scan confirms that you likely have recurrent bladder cancer that is blocking your kidney on the right.  I discussed your care with the on-call urologist and they would like for you to be seen within the next 1 to 2 weeks.  I would recommend calling the office first thing Monday morning, they should reach out and help set up an appointment but call them anyway on Monday to do this.  If you develop severe or worsening pain vomiting or fever return to the ER.  There is no signs of infection and you do not need to take the antibiotic that was prescribed

## 2022-04-17 NOTE — ED Triage Notes (Signed)
Blood in urine, denies any pain.  Rx'd abx by Doctor today but has not started taking.

## 2022-04-17 NOTE — ED Provider Triage Note (Signed)
Emergency Medicine Provider Triage Evaluation Note  Christian Kennedy , a 56 y.o. male  was evaluated in triage.  Pt complains of hematuria.  Patient reports that he had episode of hematuria earlier today.  Patient states that he started drinking lots of water with lemon extension and has noticed that the amount of blood has slowly decreased.  Review of Systems  Positive: Hematuria Negative: Dysuria, urinary urgency, abdominal pain, nausea, vomiting  Physical Exam  BP (!) 155/89 (BP Location: Left Arm)   Pulse (!) 103   Temp 98.6 F (37 C) (Oral)   Resp 16   SpO2 97%  Gen:   Awake, no distress   Resp:  Normal effort  MSK:   Moves extremities without difficulty  Other:    Medical Decision Making  Medically screening exam initiated at 2:42 PM.  Appropriate orders placed.  Christian Kennedy was informed that the remainder of the evaluation will be completed by another provider, this initial triage assessment does not replace that evaluation, and the importance of remaining in the ED until their evaluation is complete.  Patient has history of bladder cancer status postresection 2019.  Patient did not follow-up with urology after the surgery.  Patient has CT abdomen pelvis ordered by [facility] for repeat evaluation.  Will obtain CT imaging at this time.   Loni Beckwith, [place] 04/17/22 1444

## 2022-04-19 LAB — URINE CULTURE: Culture: NO GROWTH

## 2022-04-30 ENCOUNTER — Telehealth: Payer: Self-pay

## 2022-04-30 NOTE — Telephone Encounter (Signed)
Current insurance listed [employer]  Submitted surgery case to insurance for PA Received notification from [employer] patient is not insured by that company.  I spoke with patient, patient has another form of insurance. He will call the office tomorrow or Monday and provide new insurance information.

## 2022-04-30 NOTE — Telephone Encounter (Signed)
-----   Message from Festus Aloe, MD sent at 04/23/2022 11:17 AM EDT ----- Regarding: FW: f/u for patient - bladder CA Madelon Lips looked into it and this patient has [employer] which [facility] is out of network. I can't do him (unless we blocked some clinic and I did him on a Monday when I was in [place]), so Dr. Alyson Ingles will do him. Please schedule with Dr. Alyson Ingles. Thank you!   ----- Message ----- From: Festus Aloe, MD Sent: 04/21/2022   1:07 PM EDT To: Dorisann Frames, RN Subject: FW: f/u for patient - bladder CA               Estill Bamberg - this patient went to the ER and got a CT. He has recurrent bladder cancer and right hydronphrosis. I'm out of town and also not back in [place] for a couple of weeks.   Would you please fill out a green sheet and fax it to Darrel Reach (she's my scheduler now) for : bladder neoplasm, right hydronephrosis; 2) TURBT, right retrograde and right ureteral stent, 3) [place] [facility] or [facility], 4) 75 minutes, 5) cefazolin 2 grams IV, 6) labs, ekg, xray per anesthesia, 7) PAS stockings, 8) schedule ASAP - hopefully next Tuesday 8/1   Thank you! He can keep his appt for later in August with me as a follow-up/post-op. I called and left patient a message to know all this was coming.    ----- Message ----- From: Noemi Chapel, MD Sent: 04/17/2022   8:35 PM EDT To: Festus Aloe, MD; Lucas Mallow, MD Subject: f/u for patient - bladder CA                   Hey guys I saw this patient tonight, he had recently followed up and needed a CT scan which we were able to get done in the emergency department.  He does have hydronephrosis on the right secondary to an obstructing mass in his kidney, thankfully no significant renal dysfunction, no significant symptoms, no infection, discussed with Dr. Gloriann Loan on the phone, needs close follow-up, thanks guys  Aaron Edelman

## 2022-04-30 NOTE — Telephone Encounter (Signed)
I spoke with Mr. Elmquist. We have discussed possible surgery dates and 05/18/2022 was agreed upon by all parties. Patient given information about surgery date, what to expect pre-operatively and post operatively.    We discussed that a pre-op nurse will be calling to set up the pre-op visit that will take place prior to surgery. Informed patient that our office will communicate any additional care to be provided after surgery.    Patients questions or concerns were discussed during our call. Advised to call our office should there be any additional information, questions or concerns that arise. Patient verbalized understanding.

## 2022-05-06 ENCOUNTER — Encounter: Payer: Self-pay | Admitting: Internal Medicine

## 2022-05-06 ENCOUNTER — Ambulatory Visit (INDEPENDENT_AMBULATORY_CARE_PROVIDER_SITE_OTHER): Payer: 59 | Admitting: Internal Medicine

## 2022-05-06 DIAGNOSIS — D494 Neoplasm of unspecified behavior of bladder: Secondary | ICD-10-CM | POA: Diagnosis not present

## 2022-05-06 DIAGNOSIS — R634 Abnormal weight loss: Secondary | ICD-10-CM

## 2022-05-06 DIAGNOSIS — C689 Malignant neoplasm of urinary organ, unspecified: Secondary | ICD-10-CM | POA: Diagnosis not present

## 2022-05-06 MED ORDER — TRAMADOL HCL 50 MG PO TABS: 50.0000 mg | ORAL_TABLET | Freq: Four times a day (QID) | ORAL | 1 refills | Status: AC | PRN

## 2022-05-06 NOTE — Assessment & Plan Note (Addendum)
Pt states he lost wt on die

## 2022-05-06 NOTE — Assessment & Plan Note (Signed)
Tramadol prn pain Surgery is pending on 8/21

## 2022-05-06 NOTE — Progress Notes (Signed)
Subjective:  Patient ID: Christian Kennedy, male    DOB: 03/22/1966  Age: 56 y.o. MRN: [id number]  CC: No chief complaint on file.   HPI Christian Kennedy presents for gross hematuria - resolved C/o bladder burning Pt lost wt on diet  Outpatient Medications Prior to Visit  Medication Sig Dispense Refill   oxybutynin (DITROPAN) 5 MG tablet Take 1 tablet (5 mg total) by mouth 3 (three) times daily as needed for bladder spasms. 60 tablet 3   phenazopyridine (PYRIDIUM) 100 MG tablet Take 1 tablet (100 mg total) by mouth 3 (three) times daily as needed for pain. 30 tablet 1   RABEprazole (ACIPHEX) 20 MG tablet Take 1 tablet by mouth once daily 90 tablet 3   nitrofurantoin, macrocrystal-monohydrate, (MACROBID) 100 MG capsule Take 1 capsule (100 mg total) by mouth 2 (two) times daily. 28 capsule 1   loratadine (CLARITIN) 10 MG tablet Take 1 tablet (10 mg total) by mouth daily. (Patient not taking: Reported on 04/17/2022) 90 tablet 1   No facility-administered medications prior to visit.    ROS: Review of Systems  Constitutional:  Positive for unexpected weight change. Negative for appetite change and fatigue.  HENT:  Negative for congestion, nosebleeds, sneezing, sore throat and trouble swallowing.   Eyes:  Negative for itching and visual disturbance.  Respiratory:  Negative for cough.   Cardiovascular:  Negative for chest pain, palpitations and leg swelling.  Gastrointestinal:  Negative for abdominal distention, blood in stool, diarrhea and nausea.  Genitourinary:  Positive for dysuria, frequency and urgency. Negative for hematuria.  Musculoskeletal:  Negative for back pain, gait problem, joint swelling and neck pain.  Skin:  Negative for rash.  Neurological:  Negative for dizziness, tremors, speech difficulty and weakness.  Psychiatric/Behavioral:  Negative for agitation, dysphoric mood and sleep disturbance. The patient is not nervous/anxious.     Objective:  BP 120/82 (BP Location:  Right Arm, Patient Position: Sitting, Cuff Size: Large)   Pulse 77   Temp 97.9 F (36.6 C) (Oral)   Ht '6\' 3"'$  (1.905 m)   Wt 238 lb (108 kg)   SpO2 96%   BMI 29.75 kg/m   BP Readings from Last 3 Encounters:  05/06/22 120/82  04/17/22 (!) 149/79  04/17/22 (!) 155/89    Wt Readings from Last 3 Encounters:  05/06/22 238 lb (108 kg)  04/13/22 240 lb (108.9 kg)  03/23/22 256 lb (116.1 kg)    Physical Exam Constitutional:      General: He is not in acute distress.    Appearance: He is well-developed.     Comments: NAD  Eyes:     Conjunctiva/sclera: Conjunctivae normal.     Pupils: Pupils are equal, round, and reactive to light.  Neck:     Thyroid: No thyromegaly.     Vascular: No JVD.  Cardiovascular:     Rate and Rhythm: Normal rate and regular rhythm.     Heart sounds: Normal heart sounds. No murmur heard.    No friction rub. No gallop.  Pulmonary:     Effort: Pulmonary effort is normal. No respiratory distress.     Breath sounds: Normal breath sounds. No wheezing or rales.  Chest:     Chest wall: No tenderness.  Abdominal:     General: Bowel sounds are normal. There is no distension.     Palpations: Abdomen is soft. There is no mass.     Tenderness: There is no abdominal tenderness. There is no guarding or  rebound.  Musculoskeletal:        General: No tenderness. Normal range of motion.     Cervical back: Normal range of motion.  Lymphadenopathy:     Cervical: No cervical adenopathy.  Skin:    General: Skin is warm and dry.     Findings: No rash.  Neurological:     Mental Status: He is alert and oriented to person, place, and time.     Cranial Nerves: No cranial nerve deficit.     Motor: No abnormal muscle tone.     Coordination: Coordination normal.     Gait: Gait normal.     Deep Tendon Reflexes: Reflexes are normal and symmetric.  Psychiatric:        Behavior: Behavior normal.        Thought Content: Thought content normal.        Judgment: Judgment  normal.     Lab Results  Component Value Date   WBC 12.4 (H) 04/17/2022   HGB 14.2 04/17/2022   HCT 42.1 04/17/2022   PLT 207 04/17/2022   GLUCOSE 98 04/17/2022   CHOL 200 10/08/2021   TRIG 248.0 (H) 10/08/2021   HDL 34.60 (L) 10/08/2021   LDLDIRECT 132.0 10/08/2021   ALT 14 04/17/2022   AST 12 (L) 04/17/2022   NA 135 04/17/2022   K 3.9 04/17/2022   CL 106 04/17/2022   CREATININE 1.22 04/17/2022   BUN 16 04/17/2022   CO2 21 (L) 04/17/2022   TSH 0.74 10/08/2021   PSA 1.18 10/08/2021   INR 1.02 11/20/2017    CT ABDOMEN PELVIS WO CONTRAST  Result Date: 04/17/2022 CLINICAL DATA:  Acute nonlocalized abdominal pain with hematuria. History of bladder cancer. EXAM: CT ABDOMEN AND PELVIS WITHOUT CONTRAST TECHNIQUE: Multidetector CT imaging of the abdomen and pelvis was performed following the standard protocol without IV contrast. RADIATION DOSE REDUCTION: This exam was performed according to the departmental dose-optimization program which includes automated exposure control, adjustment of the mA and/or kV according to patient size and/or use of iterative reconstruction technique. COMPARISON:  08/05/2018 FINDINGS: Lower chest: Lung bases are clear. Hepatobiliary: No focal liver abnormality is seen. No gallstones, gallbladder wall thickening, or biliary dilatation. Pancreas: Unremarkable. No pancreatic ductal dilatation or surrounding inflammatory changes. Spleen: Normal in size without focal abnormality. Adrenals/Urinary Tract: No adrenal gland nodules. Prominent right hydronephrosis and hydroureter. No ureteral stones are demonstrated but there is a polypoid mass in the right side of the bladder which is likely obstructing the ureter. This is new since the previous study. The bladder mass measures about 3.2 x 4.2 cm. Calcification in the left kidney measuring 5 mm diameter. No hydronephrosis on the left. Stomach/Bowel: Stomach is within normal limits. Appendix appears normal. No evidence of  bowel wall thickening, distention, or inflammatory changes. Vascular/Lymphatic: Aortic atherosclerosis. No enlarged abdominal or pelvic lymph nodes. Reproductive: Prostate is unremarkable. Other: No abdominal wall hernia or abnormality. No abdominopelvic ascites. Musculoskeletal: No acute or significant osseous findings. IMPRESSION: 1. 4.2 cm diameter polypoid mass in the right posterior bladder, developing since prior study and suspicious for recurrent bladder cancer. The mass is causing obstruction of the right ureter with severe proximal hydronephrosis. 2. Nonobstructing stone in the left kidney. Electronically Signed   By: Lucienne Capers M.D.   On: 04/17/2022 19:56    Assessment & Plan:   Problem List Items Addressed This Visit     Bladder tumor    Tramadol prn pain Surgery is pending on 8/21  Urothelial cancer ([place])    Tramadol prn pain Surgery is pending on 8/21      Weight loss    Pt states he lost wt on die         Meds ordered this encounter  Medications   traMADol (ULTRAM) 50 MG tablet    Sig: Take 1 tablet (50 mg total) by mouth every 6 (six) hours as needed for up to 5 days for severe pain.    Dispense:  20 tablet    Refill:  1      Follow-up: Return in about 2 months (around 07/06/2022) for a follow-up visit.  Walker Kehr, MD

## 2022-05-07 NOTE — Telephone Encounter (Signed)
New insurance information obtained. NO PA needed for codes provided.

## 2022-05-13 ENCOUNTER — Encounter (HOSPITAL_COMMUNITY)
Admission: RE | Admit: 2022-05-13 | Discharge: 2022-05-13 | Disposition: A | Discharge status: Home / Self Care | Payer: 59 | Source: Ambulatory Visit | Attending: Urology | Admitting: Urology

## 2022-05-13 ENCOUNTER — Ambulatory Visit (HOSPITAL_COMMUNITY): Payer: 59

## 2022-05-18 ENCOUNTER — Encounter (HOSPITAL_COMMUNITY)
Admission: RE | Disposition: A | Discharge status: Home / Self Care | Payer: Self-pay | Source: Home / Self Care | Attending: Urology

## 2022-05-18 ENCOUNTER — Ambulatory Visit (HOSPITAL_BASED_OUTPATIENT_CLINIC_OR_DEPARTMENT_OTHER): Payer: 59 | Admitting: Anesthesiology

## 2022-05-18 ENCOUNTER — Ambulatory Visit (HOSPITAL_COMMUNITY): Payer: 59

## 2022-05-18 ENCOUNTER — Ambulatory Visit: Payer: 59 | Admitting: Urology

## 2022-05-18 ENCOUNTER — Encounter (HOSPITAL_COMMUNITY): Payer: Self-pay | Admitting: Urology

## 2022-05-18 ENCOUNTER — Ambulatory Visit (HOSPITAL_COMMUNITY)
Admission: RE | Admit: 2022-05-18 | Discharge: 2022-05-18 | Disposition: A | Discharge status: Home / Self Care | Payer: 59 | Attending: Urology | Admitting: Urology

## 2022-05-18 ENCOUNTER — Other Ambulatory Visit: Payer: Self-pay

## 2022-05-18 ENCOUNTER — Ambulatory Visit (HOSPITAL_COMMUNITY): Payer: 59 | Admitting: Anesthesiology

## 2022-05-18 DIAGNOSIS — N1330 Unspecified hydronephrosis: Secondary | ICD-10-CM | POA: Diagnosis not present

## 2022-05-18 DIAGNOSIS — K219 Gastro-esophageal reflux disease without esophagitis: Secondary | ICD-10-CM | POA: Insufficient documentation

## 2022-05-18 DIAGNOSIS — N1339 Other hydronephrosis: Secondary | ICD-10-CM | POA: Diagnosis not present

## 2022-05-18 DIAGNOSIS — C679 Malignant neoplasm of bladder, unspecified: Secondary | ICD-10-CM | POA: Insufficient documentation

## 2022-05-18 DIAGNOSIS — N3289 Other specified disorders of bladder: Secondary | ICD-10-CM | POA: Diagnosis not present

## 2022-05-18 DIAGNOSIS — F17210 Nicotine dependence, cigarettes, uncomplicated: Secondary | ICD-10-CM | POA: Diagnosis not present

## 2022-05-18 DIAGNOSIS — D494 Neoplasm of unspecified behavior of bladder: Secondary | ICD-10-CM

## 2022-05-18 DIAGNOSIS — R69 Illness, unspecified: Secondary | ICD-10-CM | POA: Diagnosis not present

## 2022-05-18 DIAGNOSIS — C672 Malignant neoplasm of lateral wall of bladder: Secondary | ICD-10-CM | POA: Diagnosis not present

## 2022-05-18 HISTORY — PX: TRANSURETHRAL RESECTION OF BLADDER TUMOR: SHX2575

## 2022-05-18 SURGERY — TURBT (TRANSURETHRAL RESECTION OF BLADDER TUMOR)
Anesthesia: General | Site: Bladder

## 2022-05-18 MED ORDER — ONDANSETRON HCL 4 MG/2ML IJ SOLN
INTRAMUSCULAR | Status: DC | PRN
  Administered 2022-05-18: 4 mg via INTRAVENOUS

## 2022-05-18 MED ORDER — FENTANYL CITRATE (PF) 100 MCG/2ML IJ SOLN
INTRAMUSCULAR | Status: AC
  Filled 2022-05-18: qty 2

## 2022-05-18 MED ORDER — DEXAMETHASONE SODIUM PHOSPHATE 10 MG/ML IJ SOLN
INTRAMUSCULAR | Status: DC | PRN
  Administered 2022-05-18: 10 mg via INTRAVENOUS

## 2022-05-18 MED ORDER — FENTANYL CITRATE PF 50 MCG/ML IJ SOSY
25.0000 ug | PREFILLED_SYRINGE | INTRAMUSCULAR | Status: DC | PRN
  Administered 2022-05-18: 50 ug via INTRAVENOUS
  Filled 2022-05-18: qty 1

## 2022-05-18 MED ORDER — DIATRIZOATE MEGLUMINE 30 % UR SOLN
URETHRAL | Status: DC | PRN
  Administered 2022-05-18: 100 mL via URETHRAL

## 2022-05-18 MED ORDER — ONDANSETRON HCL 4 MG/2ML IJ SOLN: 4.0000 mg | Freq: Once | INTRAMUSCULAR | Status: DC | PRN

## 2022-05-18 MED ORDER — DIATRIZOATE MEGLUMINE 30 % UR SOLN
URETHRAL | Status: AC
  Filled 2022-05-18: qty 100

## 2022-05-18 MED ORDER — SODIUM CHLORIDE 0.9 % IR SOLN
Status: DC | PRN
  Administered 2022-05-18 (×6): 3000 mL

## 2022-05-18 MED ORDER — OXYCODONE HCL 5 MG/5ML PO SOLN
5.0000 mg | Freq: Once | ORAL | Status: AC | PRN
  Administered 2022-05-18: 5 mg via ORAL
  Filled 2022-05-18: qty 5

## 2022-05-18 MED ORDER — PROPOFOL 10 MG/ML IV BOLUS
INTRAVENOUS | Status: DC | PRN
  Administered 2022-05-18: 150 mg via INTRAVENOUS

## 2022-05-18 MED ORDER — MIDAZOLAM HCL 2 MG/2ML IJ SOLN
INTRAMUSCULAR | Status: AC
  Filled 2022-05-18: qty 2

## 2022-05-18 MED ORDER — MIDAZOLAM HCL 5 MG/5ML IJ SOLN
INTRAMUSCULAR | Status: DC | PRN
  Administered 2022-05-18: 2 mg via INTRAVENOUS

## 2022-05-18 MED ORDER — ORAL CARE MOUTH RINSE: 15.0000 mL | Freq: Once | OROMUCOSAL | Status: AC

## 2022-05-18 MED ORDER — LACTATED RINGERS IV SOLN: INTRAVENOUS | Status: DC

## 2022-05-18 MED ORDER — WATER FOR IRRIGATION, STERILE IR SOLN
Status: DC | PRN
  Administered 2022-05-18: 500 mL

## 2022-05-18 MED ORDER — SUGAMMADEX SODIUM 200 MG/2ML IV SOLN
INTRAVENOUS | Status: DC | PRN
  Administered 2022-05-18: 428 mg via INTRAVENOUS

## 2022-05-18 MED ORDER — CEFAZOLIN SODIUM-DEXTROSE 2-4 GM/100ML-% IV SOLN
2.0000 g | INTRAVENOUS | Status: AC
  Administered 2022-05-18: 2 g via INTRAVENOUS

## 2022-05-18 MED ORDER — CHLORHEXIDINE GLUCONATE 0.12 % MT SOLN
15.0000 mL | Freq: Once | OROMUCOSAL | Status: AC
  Administered 2022-05-18: 15 mL via OROMUCOSAL

## 2022-05-18 MED ORDER — OXYCODONE HCL 5 MG PO TABS: 5.0000 mg | ORAL_TABLET | Freq: Once | ORAL | Status: AC | PRN

## 2022-05-18 MED ORDER — OXYCODONE-ACETAMINOPHEN 5-325 MG PO TABS: 1.0000 | ORAL_TABLET | ORAL | 0 refills | Status: DC | PRN

## 2022-05-18 MED ORDER — PROPOFOL 10 MG/ML IV BOLUS
INTRAVENOUS | Status: AC
  Filled 2022-05-18: qty 20

## 2022-05-18 MED ORDER — LIDOCAINE HCL (CARDIAC) PF 100 MG/5ML IV SOSY
PREFILLED_SYRINGE | INTRAVENOUS | Status: DC | PRN
  Administered 2022-05-18: 60 mg via INTRATRACHEAL

## 2022-05-18 MED ORDER — FENTANYL CITRATE (PF) 100 MCG/2ML IJ SOLN
INTRAMUSCULAR | Status: DC | PRN
Start: 2022-05-18 — End: 2022-05-18
  Administered 2022-05-18: 100 ug via INTRAVENOUS
  Administered 2022-05-18 (×4): 50 ug via INTRAVENOUS

## 2022-05-18 MED ORDER — CEFAZOLIN SODIUM-DEXTROSE 2-4 GM/100ML-% IV SOLN
INTRAVENOUS | Status: AC
  Filled 2022-05-18: qty 100

## 2022-05-18 MED ORDER — ROCURONIUM BROMIDE 10 MG/ML (PF) SYRINGE
PREFILLED_SYRINGE | INTRAVENOUS | Status: DC | PRN
  Administered 2022-05-18: 20 mg via INTRAVENOUS
  Administered 2022-05-18: 50 mg via INTRAVENOUS

## 2022-05-18 SURGICAL SUPPLY — 34 items
BAG DRAIN URO TABLE W/ADPT NS (BAG) ×3 IMPLANT
BAG DRN 8 ADPR NS SKTRN CSTL (BAG) ×2
BAG DRN RND TRDRP ANRFLXCHMBR (UROLOGICAL SUPPLIES) ×2
BAG HAMPER (MISCELLANEOUS) ×3 IMPLANT
BAG URINE DRAIN 2000ML AR STRL (UROLOGICAL SUPPLIES) ×3 IMPLANT
CATH FOLEY 3WAY 30CC 22F (CATHETERS) ×1 IMPLANT
CATH FOLEY LATEX FREE 22FR (CATHETERS)
CATH FOLEY LF 22FR (CATHETERS) IMPLANT
CATH INTERMIT  6FR 70CM (CATHETERS) ×3 IMPLANT
CLOTH BEACON ORANGE TIMEOUT ST (SAFETY) ×3 IMPLANT
ELECT LOOP 22F BIPOLAR SML (ELECTROSURGICAL) ×2
ELECTRODE LOOP 22F BIPOLAR SML (ELECTROSURGICAL) ×3 IMPLANT
EXTRACTOR STONE NITINOL NGAGE (UROLOGICAL SUPPLIES) IMPLANT
GLOVE BIO SURGEON STRL SZ8 (GLOVE) ×3 IMPLANT
GLOVE BIOGEL PI IND STRL 7.0 (GLOVE) ×6 IMPLANT
GLOVE BIOGEL PI INDICATOR 7.0 (GLOVE) ×4
GOWN STRL REUS W/TWL LRG LVL3 (GOWN DISPOSABLE) ×3 IMPLANT
GOWN STRL REUS W/TWL XL LVL3 (GOWN DISPOSABLE) ×3 IMPLANT
GUIDEWIRE STR DUAL SENSOR (WIRE) ×3 IMPLANT
GUIDEWIRE STR ZIPWIRE 035X150 (MISCELLANEOUS) ×3 IMPLANT
IV NS IRRIG 3000ML ARTHROMATIC (IV SOLUTION) ×10 IMPLANT
KIT TURNOVER CYSTO (KITS) ×3 IMPLANT
MANIFOLD NEPTUNE II (INSTRUMENTS) ×3 IMPLANT
PACK CYSTO (CUSTOM PROCEDURE TRAY) ×3 IMPLANT
PAD ARMBOARD 7.5X6 YLW CONV (MISCELLANEOUS) ×3 IMPLANT
PLUG CATH AND CAP STER (CATHETERS) IMPLANT
SHEATH URETERAL 12FRX35CM (MISCELLANEOUS) IMPLANT
SYR 10ML LL (SYRINGE) ×3 IMPLANT
SYR 30ML LL (SYRINGE) ×3 IMPLANT
SYR CONTROL 10ML LL (SYRINGE) ×3 IMPLANT
SYR TOOMEY IRRIG 70ML (MISCELLANEOUS) ×2
SYRINGE TOOMEY IRRIG 70ML (MISCELLANEOUS) ×3 IMPLANT
TOWEL OR 17X26 4PK STRL BLUE (TOWEL DISPOSABLE) ×3 IMPLANT
WATER STERILE IRR 500ML POUR (IV SOLUTION) ×3 IMPLANT

## 2022-05-18 NOTE — H&P (Signed)
Urology Admission H&P  Chief Complaint: bladder cancer  History of Present Illness: Christian Kennedy is a 56yo here for bladder tumor resection and possible right ureteral stent placement. He has a history of T2 bladder cancer first diagnosed in 2019 with Dr. Lovena Neighbours. On recent imaging he was found to have a recurrence and new right hydronephrosis.   Past Medical History:  Diagnosis Date   Abscess    Right buttocks   Bladder tumor    Depression    Son passed 2014   Ear infection    Eating disorder    Frequent headaches    GERD (gastroesophageal reflux disease)    Helicobacter pylori ab+ 10/27/2017   Hepatitis    childhood, jaundice   Migraines    Urinary incontinence    Varicose vein of leg    Right leg   Vitamin D deficiency    Past Surgical History:  Procedure Laterality Date   CYSTOSCOPY W/ URETERAL STENT PLACEMENT Right 11/19/2017   Procedure: CYSTOSCOPY WITH RETROGRADE PYELOGRAM/URETERAL STENT PLACEMENT;  Surgeon: Ceasar Mons, MD;  [facility];  Service: Urology;  Laterality: Right;   CYSTOSCOPY WITH FULGERATION N/A 11/19/2017   Procedure: [place] AND CLOT EVACUATION;  Surgeon: Nickie Retort, MD;  Location: [facility];  Service: Urology;  Laterality: N/A;   TRANSURETHRAL RESECTION OF BLADDER TUMOR N/A 11/19/2017   Procedure: TRANSURETHRAL RESECTION OF BLADDER TUMOR (TURBT);  Surgeon: Ceasar Mons, MD;  Location: [facility];  Service: Urology;  Laterality: N/A;    Home Medications:  Current Facility-Administered Medications  Medication Dose Route Frequency Provider Last Rate Last Admin   ceFAZolin (ANCEF) 2-4 GM/100ML-% IVPB            ceFAZolin (ANCEF) IVPB 2g/100 mL premix  2 g Intravenous 30 min Pre-Op Xiomar Crompton, Candee Furbish, MD       Allergies:  Allergies  Allergen Reactions   Macrobid [Nitrofurantoin]     ?epig abd pain    Family History  Problem Relation Age of Onset   Cancer  Mother    Depression Mother    Arthritis Father    Depression Father    Hearing loss Father    Depression Daughter    Early death Son    Social History:  reports that he has been smoking cigarettes. He has a 30.00 pack-year smoking history. He has never used smokeless tobacco. He reports current alcohol use. He reports that he does not use drugs.  Review of Systems  Genitourinary:  Positive for dysuria and hematuria.  All other systems reviewed and are negative.   Physical Exam:  Vital signs in last 24 hours: Temp:  [98.1 F (36.7 C)] 98.1 F (36.7 C) (08/21 0850) Pulse Rate:  [71] 71 (08/21 0850) Resp:  [14] 14 (08/21 0850) BP: (143)/(76) 143/76 (08/21 0850) SpO2:  [68 %] 68 % (08/21 0850) Weight:  [580 kg] 107 kg (08/21 0850) Physical Exam Vitals reviewed.  Constitutional:      Appearance: Normal appearance.  HENT:     Head: Normocephalic and atraumatic.     Nose: Nose normal. No congestion.  Eyes:     Extraocular Movements: Extraocular movements intact.     Pupils: Pupils are equal, round, and reactive to light.  Cardiovascular:     Rate and Rhythm: Normal rate and regular rhythm.  Pulmonary:     Effort: Pulmonary effort is normal. No respiratory distress.  Abdominal:     General: Abdomen is flat. There  is no distension.  Musculoskeletal:        General: No swelling. Normal range of motion.     Cervical back: Normal range of motion and neck supple.  Skin:    General: Skin is warm and dry.  Neurological:     General: No focal deficit present.     Mental Status: He is alert and oriented to person, place, and time.  Psychiatric:        Mood and Affect: Mood normal.        Behavior: Behavior normal.        Thought Content: Thought content normal.        Judgment: Judgment normal.     Laboratory Data:  No results found for this or any previous visit (from the past 24 hour(s)). No results found for this or any previous visit (from the past 240  hour(s)). Creatinine: No results for input(s): "CREATININE" in the last 168 hours. Baseline Creatinine:   Impression/Assessment:  56yo with bladder tumor and right hydronephrosis  Plan:  The risks/benefits/alternatives to bladder tumor resection and possible right ureteral stent placement was explained to the patient and he understands and wishes to proceed with surgery  [name] 05/18/2022, 8:57 AM

## 2022-05-18 NOTE — Op Note (Signed)
.  Preoperative diagnosis: bladder tumor and right hydronephrosis  Postoperative diagnosis: Same  Procedure: 1 cystoscopy 2.   Clot evacuation 3. Transurethral resection of bladder tumor, large  Attending: [name]  Anesthesia: General  Estimated blood loss: Minimal  Drains: 22 French foley  Specimens: bladder tumor  Antibiotics: ancef  Findings:  50cc clot in the bladder. 7cm sessile necrotic right lateral wall tumor. Left ureteral orifice in normal anatomic location. Unable to identify the right ureteral orifice   Indications: Patient is a 56 year old male with a history of muscle invasive bladder cancer who developed tumor recurrence and new right hydronephrosis.  After discussing treatment options, they decided proceed with transurethral resection of a bladder tumor and right ureteral stent placement.  Procedure in detail: The patient was brought to the operating room and a brief timeout was done to ensure correct patient, correct procedure, correct site.  General anesthesia was administered patient was placed in dorsal lithotomy position.  Their genitalia was then prepped and draped in usual sterile fashion.  A rigid 63 French cystoscope was passed in the urethra and the bladder.  Bladder was inspected and we noted a large amount of clot and a 7cm bladder tumor.  the left ureteral orifice was in the normal orthotopic locations.  We then removed the cystoscope and placed a resectoscope into the bladder. We proceeded to remove the large clot burden from the bladder. Once this was complete we turned our attention to the bladder tumor. Using the bipolar resectoscope we removed the bladder tumor down to the base. A subsequent muscle deep biopsy was then taken. Hemostasis was then obtained with electrocautery. After resection we were unable to identify the right ureteral orifice. We then removed the bladder tumor chips and sent them for pathology. We then re-inspected the bladder and  found no residula bleeding.  the bladder was then drained, a 22 French foley was placed and this concluded the procedure which was well tolerated by patient.  Complications: None  Condition: Stable, extubated, transferred to PACU  Plan: Patient is admitted overnight with continuous bladder irrigation. If their urine is clear tomorrow they will be discharged home and followup in 5 days for foley catheter removal and pathology discussion.

## 2022-05-18 NOTE — Transfer of Care (Signed)
Immediate Anesthesia Transfer of Care Note  Patient: Christian Kennedy  Procedure(s) Performed: TRANSURETHRAL RESECTION OF BLADDER TUMOR (TURBT) (Bladder)  Patient Location: PACU  Anesthesia Type:General  Level of Consciousness: awake, alert  and oriented  Airway & Oxygen Therapy: Patient Spontanous Breathing  Post-op Assessment: Report given to RN and Post -op Vital signs reviewed and stable  Post vital signs: Reviewed and stable  Last Vitals:  Vitals Value Taken Time  BP 160/88 05/18/22 1034  Temp 36.5 C 05/18/22 1032  Pulse 79 05/18/22 1038  Resp 13 05/18/22 1038  SpO2 97 % 05/18/22 1038  Vitals shown include unvalidated device data.  Last Pain:  Vitals:   05/18/22 1032  TempSrc:   PainSc: 8       Patients Stated Pain Goal: 4 (43/88/87 5797)  Complications: No notable events documented.

## 2022-05-18 NOTE — Anesthesia Postprocedure Evaluation (Signed)
Anesthesia Post Note  Patient: Christian Kennedy  Procedure(s) Performed: TRANSURETHRAL RESECTION OF BLADDER TUMOR (TURBT) (Bladder)  Patient location during evaluation: Phase II Anesthesia Type: General Level of consciousness: awake Pain management: pain level controlled Vital Signs Assessment: post-procedure vital signs reviewed and stable Respiratory status: spontaneous breathing and respiratory function stable Cardiovascular status: blood pressure returned to baseline and stable Postop Assessment: no headache and no apparent nausea or vomiting Anesthetic complications: no Comments: Late entry   No notable events documented.   Last Vitals:  Vitals:   05/18/22 1115 05/18/22 1132  BP: 138/79 128/79  Pulse: 75 82  Resp: 15 16  Temp:    SpO2: 95% 95%    Last Pain:  Vitals:   05/18/22 1132  TempSrc: Oral  PainSc: [place]

## 2022-05-18 NOTE — Anesthesia Preprocedure Evaluation (Signed)
Anesthesia Evaluation  Patient identified by MRN, date of birth, ID band Patient awake    Reviewed: Allergy & Precautions, H&P , NPO status , Patient's Chart, lab work & pertinent test results, reviewed documented beta blocker date and time   Airway Mallampati: II  TM Distance: >3 FB Neck ROM: full    Dental no notable dental hx.    Pulmonary neg pulmonary ROS, Current Smoker and Patient abstained from smoking.,    Pulmonary exam normal breath sounds clear to auscultation       Cardiovascular Exercise Tolerance: Good negative cardio ROS   Rhythm:regular Rate:Normal     Neuro/Psych  Headaches, PSYCHIATRIC DISORDERS Anxiety Depression  Neuromuscular disease    GI/Hepatic Neg liver ROS, GERD  Medicated,  Endo/Other  negative endocrine ROS  Renal/GU negative Renal ROS  negative genitourinary   Musculoskeletal   Abdominal   Peds  Hematology negative hematology ROS (+)   Anesthesia Other Findings   Reproductive/Obstetrics negative OB ROS                             Anesthesia Physical Anesthesia Plan  ASA: 2  Anesthesia Plan: General and General LMA   Post-op Pain Management:    Induction:   PONV Risk Score and Plan: Ondansetron  Airway Management Planned:   Additional Equipment:   Intra-op Plan:   Post-operative Plan:   Informed Consent: I have reviewed the patients History and Physical, chart, labs and discussed the procedure including the risks, benefits and alternatives for the proposed anesthesia with the patient or authorized representative who has indicated his/her understanding and acceptance.     Dental Advisory Given  Plan Discussed with: CRNA  Anesthesia Plan Comments:         Anesthesia Quick Evaluation

## 2022-05-18 NOTE — Anesthesia Procedure Notes (Signed)
Procedure Name: Intubation Date/Time: 05/18/2022 9:20 AM  Performed by: Minerva Ends, CRNAPre-anesthesia Checklist: Patient identified, Emergency Drugs available, Suction available and Patient being monitored Patient Re-evaluated:Patient Re-evaluated prior to induction Oxygen Delivery Method: Circle system utilized Preoxygenation: Pre-oxygenation with 100% oxygen Induction Type: IV induction Ventilation: Mask ventilation without difficulty Laryngoscope Size: Mac and 3 Grade View: Grade I Tube type: Oral Tube size: 7.0 mm Number of attempts: 1 Airway Equipment and Method: Stylet and Oral airway Placement Confirmation: ETT inserted through vocal cords under direct vision, positive ETCO2 and breath sounds checked- equal and bilateral Secured at: 23 cm Tube secured with: Tape Dental Injury: Teeth and Oropharynx as per pre-operative assessment

## 2022-05-20 LAB — SURGICAL PATHOLOGY

## 2022-05-22 ENCOUNTER — Ambulatory Visit: Payer: 59 | Admitting: Urology

## 2022-05-22 VITALS — BP 128/86 | HR 118

## 2022-05-22 DIAGNOSIS — Z8551 Personal history of malignant neoplasm of bladder: Secondary | ICD-10-CM | POA: Diagnosis not present

## 2022-05-22 DIAGNOSIS — C672 Malignant neoplasm of lateral wall of bladder: Secondary | ICD-10-CM

## 2022-05-22 NOTE — Progress Notes (Unsigned)
05/22/2022 9:01 AM   Christian Kennedy 1966-08-11 [id number]  Referring provider: Cassandria Anger, MD [place],  [place] [address]  Followup bladder tumor resection   HPI: Christian Kennedy is a 56yo here for followup after bladder tumor resection. Pathology T2 bladder caner. He has known right hydronephrosis. He has intermittent right flank pain. Urine is clear. Voiding trial passed today     PMH: Past Medical History:  Diagnosis Date   Abscess    Right buttocks   Bladder tumor    Depression    Son passed 2014   Ear infection    Eating disorder    Frequent headaches    GERD (gastroesophageal reflux disease)    Helicobacter pylori ab+ 10/27/2017   Hepatitis    childhood, jaundice   Migraines    Urinary incontinence    Varicose vein of leg    Right leg   Vitamin D deficiency     Surgical History: Past Surgical History:  Procedure Laterality Date   CYSTOSCOPY W/ URETERAL STENT PLACEMENT Right 11/19/2017   Procedure: CYSTOSCOPY WITH RETROGRADE PYELOGRAM/URETERAL STENT PLACEMENT;  Surgeon: Ceasar Mons, MD;  Location: [facility];  Service: Urology;  Laterality: Right;   CYSTOSCOPY WITH FULGERATION N/A 11/19/2017   Procedure: [place] AND CLOT EVACUATION;  Surgeon: Nickie Retort, MD;  Location: [facility];  Service: Urology;  Laterality: N/A;   TRANSURETHRAL RESECTION OF BLADDER TUMOR N/A 11/19/2017   Procedure: TRANSURETHRAL RESECTION OF BLADDER TUMOR (TURBT);  Surgeon: Ceasar Mons, MD;  Location[facility];  Service: Urology;  Laterality: N/A;    Home Medications:  Allergies as of 05/22/2022       Reactions   Macrobid [nitrofurantoin]    ?epig abd pain        Medication List        Accurate as of May 22, 2022  9:01 AM. If you have any questions, ask your nurse or doctor.          b complex vitamins capsule Take 1 capsule by mouth daily.   B-12 PO Take 1  tablet by mouth daily.   BLACK CURRANT SEED OIL PO Take 1 capsule by mouth daily.   GOODY HEADACHE PO Take 1 Package by mouth daily as needed (headache).   oxybutynin 5 MG tablet Commonly known as: DITROPAN Take 1 tablet (5 mg total) by mouth 3 (three) times daily as needed for bladder spasms.   oxyCODONE-acetaminophen 5-325 MG tablet Commonly known as: Percocet Take 1 tablet by mouth every 4 (four) hours as needed for severe pain.   phenazopyridine 100 MG tablet Commonly known as: PYRIDIUM Take 1 tablet (100 mg total) by mouth 3 (three) times daily as needed for pain.   RABEprazole 20 MG tablet Commonly known as: ACIPHEX Take 1 tablet by mouth once daily   VITAMIN C PO Take 1 tablet by mouth daily.        Allergies:  Allergies  Allergen Reactions   Macrobid [Nitrofurantoin]     ?epig abd pain    Family History: Family History  Problem Relation Age of Onset   Cancer Mother    Depression Mother    Arthritis Father    Depression Father    Hearing loss Father    Depression Daughter    Early death Son     Social History:  reports that he has been smoking cigarettes. He has a 30.00 pack-year smoking history. He has never  used smokeless tobacco. He reports current alcohol use. He reports that he does not use drugs.  ROS: All other review of systems were reviewed and are negative except what is noted above in HPI  Physical Exam: BP 128/86   Pulse (!) 118   Constitutional:  Alert and oriented, No acute distress. HEENT: [place] AT, moist mucus membranes.  Trachea midline, no masses. Cardiovascular: No clubbing, cyanosis, or edema. Respiratory: Normal respiratory effort, no increased work of breathing. GI: Abdomen is soft, nontender, nondistended, no abdominal masses GU: No CVA tenderness.  Lymph: No cervical or inguinal lymphadenopathy. Skin: No rashes, bruises or suspicious lesions. Neurologic: Grossly intact, no focal deficits, moving all 4  extremities. Psychiatric: Normal mood and affect.  Laboratory Data: Lab Results  Component Value Date   WBC 12.4 (H) 04/17/2022   HGB 14.2 04/17/2022   HCT 42.1 04/17/2022   MCV 89.6 04/17/2022   PLT 207 04/17/2022    Lab Results  Component Value Date   CREATININE 1.22 04/17/2022    Lab Results  Component Value Date   PSA 1.18 10/08/2021   PSA 1.30 09/15/2017    Lab Results  Component Value Date   TESTOSTERONE 261.70 (L) 09/15/2017    No results found for: "HGBA1C"  Urinalysis    Component Value Date/Time   COLORURINE STRAW (A) 04/17/2022 1811   APPEARANCEUR CLEAR 04/17/2022 1811   APPEARANCEUR Clear 04/13/2022 1043   LABSPEC 1.002 (L) 04/17/2022 1811   PHURINE 6.0 04/17/2022 1811   GLUCOSEU NEGATIVE 04/17/2022 1811   GLUCOSEU NEGATIVE 03/18/2022 0934   HGBUR LARGE (A) 04/17/2022 1811   BILIRUBINUR NEGATIVE 04/17/2022 1811   BILIRUBINUR Negative 04/13/2022 1043   KETONESUR NEGATIVE 04/17/2022 1811   PROTEINUR NEGATIVE 04/17/2022 1811   UROBILINOGEN 0.2 03/18/2022 0934   NITRITE NEGATIVE 04/17/2022 1811   LEUKOCYTESUR NEGATIVE 04/17/2022 1811    Lab Results  Component Value Date   LABMICR See below: 04/13/2022   WBCUA 6-10 (A) 04/13/2022   LABEPIT 0-10 04/13/2022   BACTERIA NONE SEEN 04/17/2022    Pertinent Imaging:  No results found for this or any previous visit.  No results found for this or any previous visit.  No results found for this or any previous visit.  No results found for this or any previous visit.  No results found for this or any previous visit.  No results found for this or any previous visit.  No results found for this or any previous visit.  No results found for this or any previous visit.   Assessment & Plan:    1. History of bladder cancer -Referral to interventional radiology for right nephrostomy tube placement -Referral to Dr. Tresa Moore at [facility] for Muscle invasive bladder cancer management - Bladder  Voiding Trial - Foley catheter - discontinue   No follow-ups on file.  [name], MD  [facility] [place]

## 2022-05-22 NOTE — Progress Notes (Unsigned)
Fill and Pull Catheter Removal  Patient is present today for a catheter removal.  Patient was cleaned and prepped in a sterile fashion 235m of sterile water/ saline was instilled into the bladder when the patient felt the urge to urinate. 327mof water was then drained from the balloon.  A 22FR foley cath was removed from the bladder no complications were noted .  Patient as then given some time to void on their own.  Patient can void  25075mn their own after some time.  Patient tolerated well.  Performed by: KouLevi AlandMA  Follow up/ Additional notes: Follow up as scheduled.

## 2022-05-23 LAB — BASIC METABOLIC PANEL
BUN/Creatinine Ratio: 17 (ref 9–20)
BUN: 17 mg/dL (ref 6–24)
CO2: 21 mmol/L (ref 20–29)
Calcium: 10.2 mg/dL (ref 8.7–10.2)
Chloride: 100 mmol/L (ref 96–106)
Creatinine, Ser: 0.98 mg/dL (ref 0.76–1.27)
Glucose: 98 mg/dL (ref 70–99)
Potassium: 4.8 mmol/L (ref 3.5–5.2)
Sodium: 138 mmol/L (ref 134–144)
eGFR: 90 mL/min/{1.73_m2} (ref >59)

## 2022-05-24 ENCOUNTER — Other Ambulatory Visit: Payer: Self-pay | Admitting: Radiology

## 2022-05-24 DIAGNOSIS — N1330 Unspecified hydronephrosis: Secondary | ICD-10-CM

## 2022-05-25 ENCOUNTER — Encounter (HOSPITAL_COMMUNITY): Payer: Self-pay | Admitting: Urology

## 2022-05-26 ENCOUNTER — Encounter (HOSPITAL_COMMUNITY): Payer: Self-pay

## 2022-05-26 ENCOUNTER — Ambulatory Visit (HOSPITAL_COMMUNITY)
Admission: RE | Admit: 2022-05-26 | Discharge: 2022-05-26 | Disposition: A | Discharge status: Home / Self Care | Payer: 59 | Source: Ambulatory Visit | Attending: Urology | Admitting: Urology

## 2022-05-26 ENCOUNTER — Other Ambulatory Visit: Payer: Self-pay

## 2022-05-26 DIAGNOSIS — N1330 Unspecified hydronephrosis: Secondary | ICD-10-CM | POA: Insufficient documentation

## 2022-05-26 DIAGNOSIS — N135 Crossing vessel and stricture of ureter without hydronephrosis: Secondary | ICD-10-CM | POA: Diagnosis not present

## 2022-05-26 DIAGNOSIS — N200 Calculus of kidney: Secondary | ICD-10-CM | POA: Diagnosis not present

## 2022-05-26 DIAGNOSIS — R69 Illness, unspecified: Secondary | ICD-10-CM | POA: Diagnosis not present

## 2022-05-26 DIAGNOSIS — K219 Gastro-esophageal reflux disease without esophagitis: Secondary | ICD-10-CM | POA: Diagnosis not present

## 2022-05-26 DIAGNOSIS — F32A Depression, unspecified: Secondary | ICD-10-CM | POA: Diagnosis not present

## 2022-05-26 DIAGNOSIS — C672 Malignant neoplasm of lateral wall of bladder: Secondary | ICD-10-CM | POA: Diagnosis not present

## 2022-05-26 DIAGNOSIS — C689 Malignant neoplasm of urinary organ, unspecified: Secondary | ICD-10-CM | POA: Diagnosis not present

## 2022-05-26 HISTORY — PX: IR NEPHROSTOMY PLACEMENT RIGHT: IMG6064

## 2022-05-26 LAB — CBC WITH DIFFERENTIAL/PLATELET
Abs Immature Granulocytes: 0.06 10*3/uL (ref 0.00–0.07)
Basophils Absolute: 0.1 10*3/uL (ref 0.0–0.1)
Basophils Relative: 0 %
Eosinophils Absolute: 0.1 10*3/uL (ref 0.0–0.5)
Eosinophils Relative: 1 %
HCT: 46.7 % (ref 39.0–52.0)
Hemoglobin: 15.5 g/dL (ref 13.0–17.0)
Immature Granulocytes: 1 %
Lymphocytes Relative: 12 %
Lymphs Abs: 1.6 10*3/uL (ref 0.7–4.0)
MCH: 30.2 pg (ref 26.0–34.0)
MCHC: 33.2 g/dL (ref 30.0–36.0)
MCV: 91 fL (ref 80.0–100.0)
Monocytes Absolute: 0.9 10*3/uL (ref 0.1–1.0)
Monocytes Relative: 7 %
Neutro Abs: 10.4 10*3/uL — ABNORMAL HIGH (ref 1.7–7.7)
Neutrophils Relative %: 79 %
Platelets: 206 10*3/uL (ref 150–400)
RBC: 5.13 MIL/uL (ref 4.22–5.81)
RDW: 12.9 % (ref 11.5–15.5)
WBC: 13.1 10*3/uL — ABNORMAL HIGH (ref 4.0–10.5)
nRBC: 0 % (ref 0.0–0.2)

## 2022-05-26 LAB — BASIC METABOLIC PANEL
Anion gap: 7 (ref 5–15)
BUN: 19 mg/dL (ref 6–20)
CO2: 25 mmol/L (ref 22–32)
Calcium: 9.4 mg/dL (ref 8.9–10.3)
Chloride: 106 mmol/L (ref 98–111)
Creatinine, Ser: 1.17 mg/dL (ref 0.61–1.24)
GFR, Estimated: >60 mL/min (ref >60)
Glucose, Bld: 109 mg/dL — ABNORMAL HIGH (ref 70–99)
Potassium: 3.8 mmol/L (ref 3.5–5.1)
Sodium: 138 mmol/L (ref 135–145)

## 2022-05-26 LAB — PROTIME-INR
INR: 1.1 (ref 0.8–1.2)
Prothrombin Time: 13.6 seconds (ref 11.4–15.2)

## 2022-05-26 MED ORDER — FENTANYL CITRATE (PF) 100 MCG/2ML IJ SOLN
INTRAMUSCULAR | Status: AC | PRN
  Administered 2022-05-26: 50 ug via INTRAVENOUS

## 2022-05-26 MED ORDER — MIDAZOLAM HCL 2 MG/2ML IJ SOLN
INTRAMUSCULAR | Status: AC
  Filled 2022-05-26: qty 2

## 2022-05-26 MED ORDER — IOHEXOL 300 MG/ML  SOLN
50.0000 mL | Freq: Once | INTRAMUSCULAR | Status: AC | PRN
  Administered 2022-05-26: 5 mL

## 2022-05-26 MED ORDER — OXYCODONE HCL 5 MG PO TABS
10.0000 mg | ORAL_TABLET | Freq: Four times a day (QID) | ORAL | Status: DC | PRN
  Administered 2022-05-26: 10 mg via ORAL
  Filled 2022-05-26: qty 2

## 2022-05-26 MED ORDER — SODIUM CHLORIDE 0.9 % IV SOLN
2.0000 g | Freq: Once | INTRAVENOUS | Status: AC
  Administered 2022-05-26: 2 g via INTRAVENOUS
  Filled 2022-05-26: qty 20

## 2022-05-26 MED ORDER — LIDOCAINE HCL 1 % IJ SOLN
INTRAMUSCULAR | Status: AC
  Filled 2022-05-26: qty 20

## 2022-05-26 MED ORDER — SODIUM CHLORIDE 0.9 % IV SOLN: INTRAVENOUS | Status: DC

## 2022-05-26 MED ORDER — MIDAZOLAM HCL 2 MG/2ML IJ SOLN
INTRAMUSCULAR | Status: AC | PRN
  Administered 2022-05-26: 1 mg via INTRAVENOUS

## 2022-05-26 MED ORDER — FENTANYL CITRATE (PF) 100 MCG/2ML IJ SOLN
INTRAMUSCULAR | Status: AC
  Filled 2022-05-26: qty 2

## 2022-05-26 MED ORDER — LIDOCAINE HCL 1 % IJ SOLN
INTRAMUSCULAR | Status: AC | PRN
  Administered 2022-05-26: 10 mL via INTRADERMAL

## 2022-05-26 MED ORDER — SODIUM CHLORIDE 0.9% FLUSH: 5.0000 mL | Freq: Three times a day (TID) | INTRAVENOUS | Status: DC

## 2022-05-26 MED ORDER — KETOROLAC TROMETHAMINE 30 MG/ML IJ SOLN: 30.0000 mg | Freq: Once | INTRAMUSCULAR | Status: DC

## 2022-05-26 NOTE — Procedures (Signed)
Vascular and Interventional Radiology Procedure Note  Patient: Christian Kennedy DOB: Jul 25, 1966 Medical Record Number: [id number] Note Date/Time: 05/26/22 10:00 AM   Performing Physician: Michaelle Birks, MD Assistant(s): None  Diagnosis: Bladder tumor with R hydronephrosis  Procedure:  RIGHT NEPHROSTOMY TUBE PLACEMENT RIGHT ANTEROGRADE NEPHROSTOGRAM  Anesthesia: Conscious Sedation Complications: None Estimated Blood Loss: Minimal Specimens:  None  Findings:  Successful placement of right-sided, 10 F nephrostomy tube into the right kidney(s).  At least moderate volume R hydronephrosis, with proximal ureteral tortuosity.  Plan: Flush PCN w 5 mL and record drain output q8hrs. Follow up for routine nephrostomy tube exchange in 8 week(s).   See detailed procedure note with images in PACS. The patient tolerated the procedure well without incident or complication and was returned to Recovery in stable condition.    Michaelle Birks, MD Vascular and Interventional Radiology Specialists [facility] Radiology   Pager. [place]

## 2022-05-26 NOTE — Consult Note (Signed)
Chief Complaint: Patient was seen in consultation today for right percutaneous nephrostomy  Referring Physician(s): [place] [facility]  Supervising Physician: Michaelle Birks  Patient Status: [facility] - Out-pt  History of Present Illness: Christian Kennedy is a 56 y.o. male with past medical history of depression, GERD, migraines, vitamin D deficiency, left renal stone and recently diagnosed bladder cancer, status post TURBT on 05/18/2022.  He has associated right hydronephrosis ,however the right ureteral orifice could not be identified and a stent was unable to be placed during recent cystoscopy. He presents today for right percutaneous nephrostomy.  Past Medical History:  Diagnosis Date   Abscess    Right buttocks   Bladder tumor    Depression    Son passed 2014   Ear infection    Eating disorder    Frequent headaches    GERD (gastroesophageal reflux disease)    Helicobacter pylori ab+ 10/27/2017   Hepatitis    childhood, jaundice   Migraines    Urinary incontinence    Varicose vein of leg    Right leg   Vitamin D deficiency     Past Surgical History:  Procedure Laterality Date   CYSTOSCOPY W/ URETERAL STENT PLACEMENT Right 11/19/2017   Procedure: CYSTOSCOPY WITH RETROGRADE PYELOGRAM/URETERAL STENT PLACEMENT;  Surgeon: Ceasar Mons, MD;  Location: [facility];  Service: Urology;  Laterality: Right;   CYSTOSCOPY WITH FULGERATION N/A 11/19/2017   Procedure: [place] AND CLOT EVACUATION;  Surgeon: Nickie Retort, MD;  Location: [facility];  Service: Urology;  Laterality: N/A;   TRANSURETHRAL RESECTION OF BLADDER TUMOR N/A 11/19/2017   Procedure: TRANSURETHRAL RESECTION OF BLADDER TUMOR (TURBT);  Surgeon: Ceasar Mons, MD;  Location: [facility];  Service: Urology;  Laterality: N/A;   TRANSURETHRAL RESECTION OF BLADDER TUMOR N/A 05/18/2022   Procedure: TRANSURETHRAL RESECTION OF BLADDER TUMOR (TURBT);   Surgeon: Cleon Gustin, MD;  Location: [facility];  Service: Urology;  Laterality: N/A;    Allergies: Macrobid [nitrofurantoin]  Medications: Prior to Admission medications   Medication Sig Start Date End Date Taking? Authorizing Provider  Ascorbic Acid (VITAMIN C PO) Take 1 tablet by mouth daily.   Yes [provider]  Aspirin-Acetaminophen-Caffeine (GOODY HEADACHE PO) Take 1 Package by mouth daily as needed (headache).   Yes [provider]  b complex vitamins capsule Take 1 capsule by mouth daily.   Yes [provider]  BLACK CURRANT SEED OIL PO Take 1 capsule by mouth daily.   Yes [provider]  Cyanocobalamin (B-12 PO) Take 1 tablet by mouth daily.   Yes [provider]  oxybutynin (DITROPAN) 5 MG tablet Take 1 tablet (5 mg total) by mouth 3 (three) times daily as needed for bladder spasms. 03/23/22 03/23/23 Yes Plotnikov, Evie Lacks, MD  oxyCODONE-acetaminophen (PERCOCET) 5-325 MG tablet Take 1 tablet by mouth every 4 (four) hours as needed for severe pain. 05/18/22 05/18/23 Yes McKenzie, Candee Furbish, MD  phenazopyridine (PYRIDIUM) 100 MG tablet Take 1 tablet (100 mg total) by mouth 3 (three) times daily as needed for pain. 03/23/22 03/23/23 Yes Plotnikov, Evie Lacks, MD  RABEprazole (ACIPHEX) 20 MG tablet Take 1 tablet by mouth once daily 11/10/21  Yes Plotnikov, Evie Lacks, MD     Family History  Problem Relation Age of Onset   Cancer Mother    Depression Mother    Arthritis Father    Depression Father    Hearing loss Father    Depression Daughter  Early death Son     Social History   Socioeconomic History   Marital status: Married    Spouse name: Not on file   Number of children: Not on file   Years of education: Not on file   Highest education level: Not on file  Occupational History   Not on file  Tobacco Use   Smoking status: Every Day    Packs/day: 1.00    Years: 30.00    Total pack years: 30.00    Types: Cigarettes    Smokeless tobacco: Never  Vaping Use   Vaping Use: Never used  Substance and Sexual Activity   Alcohol use: Yes    Comment: rare   Drug use: No   Sexual activity: Not on file  Other Topics Concern   Not on file  Social History Narrative   Not on file   Social Determinants of Health   Financial Resource Strain: Not on file  Food Insecurity: Not on file  Transportation Needs: Not on file  Physical Activity: Not on file  Stress: Not on file  Social Connections: Not on file      Review of Systems denies fever, headache, chest pain, dyspnea, cough, back pain, nausea, vomiting or bleeding.  He does have some right lower quadrant pressure sensation  Vital Signs: BP 124/88   Pulse 99   Temp 98.4 F (36.9 C) (Oral)   Resp 16   SpO2 100%     Physical Exam awake, alert.  Chest clear to auscultation bilaterally.  Heart with regular rate and rhythm.  Abdomen soft, positive bowel sounds, some mild right lower abdominal tenderness to palpation.  No lower extremity edema.  Imaging: DG C-Arm 1-60 Min-No Report  Result Date: 05/18/2022 Fluoroscopy was utilized by the requesting physician.  No radiographic interpretation.    Labs:  CBC: Recent Labs    10/08/21 0835 03/18/22 0934 04/17/22 1446  WBC 8.3 7.6 12.4*  HGB 16.0 15.4 14.2  HCT 47.3 45.2 42.1  PLT 196.0 193.0 207    COAGS: No results for input(s): "INR", "APTT" in the last 8760 hours.  BMP: Recent Labs    10/08/21 0835 03/18/22 0934 04/17/22 1446 05/22/22 0000  NA 139 139 135 138  K 5.0 4.5 3.9 4.8  CL 104 103 106 100  CO2 29 30 21* 21  GLUCOSE 88 94 98 98  BUN '9 15 16 17  '$ CALCIUM 10.1 9.8 9.1 10.2  CREATININE 1.00 1.10 1.22 0.98  GFRNONAA  --   --  >60  --     LIVER FUNCTION TESTS: Recent Labs    10/08/21 0835 03/18/22 0934 04/17/22 1446  BILITOT 0.8 0.6 0.7  AST 14 11 12*  ALT '17 11 14  '$ ALKPHOS 58 61 57  PROT 7.4 7.5 7.2  ALBUMIN 4.9 4.4 4.3    TUMOR MARKERS: No results for  input(s): "AFPTM", "CEA", "CA199", "CHROMGRNA" in the last 8760 hours.  Assessment and Plan: 56 y.o. male with past medical history of depression, GERD, migraines, vitamin D deficiency, left renal stone and recently diagnosed bladder cancer, status post TURBT on 05/18/2022.  He has associated right hydronephrosis ,however the right ureteral orifice could not be identified and a stent was unable to be placed during recent cystoscopy. He presents today for right percutaneous nephrostomy.Risks and benefits of right PCN placement was discussed with the patient/spouse including, but not limited to, infection, bleeding, significant bleeding causing loss or decrease in renal function or damage to adjacent structures.  All of the patient's questions were answered, patient is agreeable to proceed.  Consent signed and in chart.   LABS PENDING   Thank you for this interesting consult.  I greatly enjoyed meeting Christian Kennedy and look forward to participating in their care.  A copy of this report was sent to the requesting provider on this date.  Electronically Signed: D. Rowe Robert, PA-C 05/26/2022, 8:56 AM   I spent a total of  25 minutes   in face to face in clinical consultation, greater than 50% of which was counseling/coordinating care for right percutaneous nephrostomy

## 2022-05-27 ENCOUNTER — Telehealth (HOSPITAL_COMMUNITY): Payer: Self-pay | Admitting: Interventional Radiology

## 2022-05-27 ENCOUNTER — Ambulatory Visit: Payer: 59 | Admitting: Urology

## 2022-05-27 MED ORDER — OXYCODONE-ACETAMINOPHEN 5-325 MG PO TABS: 1.0000 | ORAL_TABLET | ORAL | 0 refills | Status: DC | PRN

## 2022-05-27 NOTE — Progress Notes (Signed)
Vascular and Interventional Radiology  Phone Note  Patient: Christian Kennedy DOB: Apr 01, 1966 Medical Record Number: [id number] Note Date/Time: 05/27/22 9:07 AM   Diagnosis: Post op R PCN placement for malignant obstruction.   I identified myself to the patient and conveyed my credentials to [place] For medical emergencies, Pt was advised to call 911 or go to the nearest emergency room.   Assessment  Plan: 56 y.o. year old male POD 1 s/p R PCN placement for malignant obstruction. Pt was discharged same day, after prolonged observation. VIR reached out in courtesy follow-up.  Pt reports that they are doing well. Mild R flank discomfort, well controlled with OTCs. Has already emptied his bag a few times, and hematuria is clearing to a light pink.   Noted inconvenience with sleep position since he previously favored sleeping on his R side.  No concern at this time.    Follow up Pt to follow up with me at [facility] within 6-8 wks post op for R PCN exchange, vs conversion to PCNU depending on his Urologist's plan.   As part of this Telephone encounter, no in-person exam was conducted.  The patient was physically located in [place] or a state in which I am permitted to provide care. The encounter was reasonable and appropriate under the circumstances given the patient's presentation at the time.  The patient and/or parent/guardian has been advised of the potential risks and limitations of this mode of treatment (including, but not limited to, the absence of in-person examination) and has agreed to be treated using telemedicine. The patient's/patient's family's questions regarding their request have been answered and/or has also been advised to contact their provider's office for worsening conditions, and seek emergency medical treatment and/or call 911 if the patient deems either necessary.   Michaelle Birks, MD Vascular and Interventional Radiology Specialists [facility]  Radiology   Pager. [place]

## 2022-05-28 ENCOUNTER — Other Ambulatory Visit (HOSPITAL_COMMUNITY): Payer: Self-pay | Admitting: Interventional Radiology

## 2022-05-28 ENCOUNTER — Encounter: Payer: Self-pay | Admitting: Urology

## 2022-05-28 DIAGNOSIS — C689 Malignant neoplasm of urinary organ, unspecified: Secondary | ICD-10-CM

## 2022-05-28 LAB — URINE CULTURE
Culture: 60000 — AB
Special Requests: NORMAL

## 2022-05-28 NOTE — Patient Instructions (Signed)

## 2022-06-02 ENCOUNTER — Other Ambulatory Visit: Payer: Self-pay

## 2022-06-03 ENCOUNTER — Inpatient Hospital Stay: Payer: 59 | Attending: Hematology | Admitting: Hematology

## 2022-06-03 VITALS — BP 112/71 | HR 85 | Temp 97.5°F | Resp 18 | Ht 75.0 in | Wt 219.5 lb

## 2022-06-03 DIAGNOSIS — C7989 Secondary malignant neoplasm of other specified sites: Secondary | ICD-10-CM | POA: Insufficient documentation

## 2022-06-03 DIAGNOSIS — R69 Illness, unspecified: Secondary | ICD-10-CM | POA: Diagnosis not present

## 2022-06-03 DIAGNOSIS — Z800 Family history of malignant neoplasm of digestive organs: Secondary | ICD-10-CM | POA: Diagnosis not present

## 2022-06-03 DIAGNOSIS — C679 Malignant neoplasm of bladder, unspecified: Secondary | ICD-10-CM | POA: Insufficient documentation

## 2022-06-03 DIAGNOSIS — F17210 Nicotine dependence, cigarettes, uncomplicated: Secondary | ICD-10-CM | POA: Diagnosis not present

## 2022-06-03 DIAGNOSIS — C689 Malignant neoplasm of urinary organ, unspecified: Secondary | ICD-10-CM

## 2022-06-03 MED ORDER — OXYCODONE-ACETAMINOPHEN 5-325 MG PO TABS: 1.0000 | ORAL_TABLET | ORAL | 0 refills | Status: DC | PRN

## 2022-06-03 NOTE — Progress Notes (Signed)
I met with the patient and his wife today during and following initial visit with Dr. Delton Coombes. I introduced myself and explained my role in the patient's care. Provided my contact information and encouraged the patient to call with questions or concerns.

## 2022-06-03 NOTE — Patient Instructions (Addendum)
[place]  Discharge Instructions  You were seen and examined today by Dr. Delton Coombes. Dr. Delton Coombes is a medical oncologist, meaning that he specializes in the treatment of cancer diagnoses. Dr. Delton Coombes discussed your past medical history, family history of cancers, and the events that led to you being here today.  You were referred to Dr. Delton Coombes by Dr. Alyson Ingles due to a new diagnosis of urothelial carcinoma. This is a type of cancer that arises in the bladder. Your cancer did invade into the healthy muscle (wall) of your bladder.  Dr. Delton Coombes has recommended an updated, specialized CT scan known as a PET scan to ensure there is no lymph node involvement or additional organ involvement.  The typical course of treatment for urothelial cancer that invades the wall of the bladder is 3 to 4 cycles of chemotherapy prior to surgery to remove the bladder. This is the best option, and the best chance for cure.  If you decide against surgery, the course of treatment would be a cystoscopy followed by chemotherapy and radiation treatment. This is the second best option.  Follow-up as scheduled.  Thank you for choosing [facility] to provide your oncology and hematology care.   To afford each patient quality time with our provider, please arrive at least 15 minutes before your scheduled appointment time. You may need to reschedule your appointment if you arrive late (10 or more minutes). Arriving late affects you and other patients whose appointments are after yours.  Also, if you miss three or more appointments without notifying the office, you may be dismissed from the clinic at the provider's discretion.    Again, thank you for choosing [facility].  Our hope is that these requests will decrease the amount of time that you wait before being seen by our physicians.   If you have a lab appointment with the [facility]  please come in thru the Main Entrance and check in at the main information desk.           _____________________________________________________________  Should you have questions after your visit to [facility], please contact our office at [phone] and follow the prompts.  Our office hours are 8:00 a.m. to 4:30 p.m. Monday - Thursday and 8:00 a.m. to 2:30 p.m. Friday.  Please note that voicemails left after 4:00 p.m. may not be returned until the following business day.  We are closed weekends and all major holidays.  You do have access to a nurse 24-7, just call the main number to the clinic [phone] and do not press any options, hold on the line and a nurse will answer the phone.    For prescription refill requests, have your pharmacy contact our office and allow 72 hours.    Masks are optional in the cancer centers. If you would like for your care team to wear a mask while they are taking care of you, please let them know. You may have one support person who is at least 57 years old accompany you for your appointments.

## 2022-06-03 NOTE — Progress Notes (Signed)
[facility] NOTE  Patient Care Team: Plotnikov, Evie Lacks, MD as PCP - General (Internal Medicine) Patient, No Pcp Per (General Practice) McKenzie, Candee Furbish, MD as Consulting Physician (Urology) Festus Aloe, MD as Consulting Physician (Urology) Derek Jack, MD as Medical Oncologist (Medical Oncology) Brien Mates, RN as Oncology Nurse Navigator (Medical Oncology)  CHIEF COMPLAINTS/PURPOSE OF CONSULTATION:  Muscle invasive bladder cancer.  HISTORY OF PRESENTING ILLNESS:  Christian Kennedy 56 y.o. male is seen in consultation today at the request of Dr. Alyson Ingles for further work-up and management of muscle invasive bladder cancer.  He had a history of high-grade muscle invasive bladder cancer, status post TURBT on 11/19/2017.  He was lost to follow-up after that and was seen by Dr. Junious Silk on 04/13/2022 and a CT scan was ordered.  It showed recurrence of the tumor.  He underwent TURBT on 05/18/2022 which showed high-grade urothelial carcinoma invading muscularis propria.  He reports that he lost about 40 pounds in the last 6 months but has cut off sugars/gluten and decreased quantity of foods.  Denies any hematuria.  Denies any new onset pains.  He also had right percutaneous nephrostomy done by IR as he developed right-sided hydronephrosis.  He lives at home with his wife.  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  He is current active smoker.  MEDICAL HISTORY:  Past Medical History:  Diagnosis Date   Abscess    Right buttocks   Bladder tumor    Depression    Son passed 2014   Ear infection    Eating disorder    Frequent headaches    GERD (gastroesophageal reflux disease)    Helicobacter pylori ab+ 10/27/2017   Hepatitis    childhood, jaundice   Migraines    Urinary incontinence    Varicose vein of leg    Right leg   Vitamin D deficiency     SURGICAL HISTORY: Past Surgical History:  Procedure Laterality Date   CYSTOSCOPY W/  URETERAL STENT PLACEMENT Right 11/19/2017   Procedure: CYSTOSCOPY WITH RETROGRADE PYELOGRAM/URETERAL STENT PLACEMENT;  Surgeon: Ceasar Mons, MD;  Location: [facility];  Service: Urology;  Laterality: Right;   CYSTOSCOPY WITH FULGERATION N/A 11/19/2017   Procedure: [place] AND CLOT EVACUATION;  Surgeon: Nickie Retort, MD;  Location: [facility];  Service: Urology;  Laterality: N/A;   IR NEPHROSTOMY PLACEMENT RIGHT  05/26/2022   TRANSURETHRAL RESECTION OF BLADDER TUMOR N/A 11/19/2017   Procedure: TRANSURETHRAL RESECTION OF BLADDER TUMOR (TURBT);  Surgeon: Ceasar Mons, MD;  Location: [facility];  Service: Urology;  Laterality: N/A;   TRANSURETHRAL RESECTION OF BLADDER TUMOR N/A 05/18/2022   Procedure: TRANSURETHRAL RESECTION OF BLADDER TUMOR (TURBT);  Surgeon: Cleon Gustin, MD;  Location: [facility];  Service: Urology;  Laterality: N/A;    SOCIAL HISTORY: Social History   Socioeconomic History   Marital status: Married    Spouse name: Not on file   Number of children: Not on file   Years of education: Not on file   Highest education level: Not on file  Occupational History   Not on file  Tobacco Use   Smoking status: Every Day    Packs/day: 1.00    Years: 30.00    Total pack years: 30.00    Types: Cigarettes   Smokeless tobacco: Never  Vaping Use   Vaping Use: Never used  Substance and Sexual Activity   Alcohol use: Yes  Comment: rare   Drug use: No   Sexual activity: Not on file  Other Topics Concern   Not on file  Social History Narrative   Not on file   Social Determinants of Health   Financial Resource Strain: Not on file  Food Insecurity: Not on file  Transportation Needs: Not on file  Physical Activity: Not on file  Stress: Not on file  Social Connections: Not on file  Intimate Partner Violence: Not on file    FAMILY HISTORY: Family History  Problem Relation Age of Onset   Cancer  Mother    Depression Mother    Arthritis Father    Depression Father    Hearing loss Father    Depression Daughter    Early death Son     ALLERGIES:  is allergic to macrobid [nitrofurantoin].  MEDICATIONS:  Current Outpatient Medications  Medication Sig Dispense Refill   Ascorbic Acid (VITAMIN C PO) Take 1 tablet by mouth daily.     Aspirin-Acetaminophen-Caffeine (GOODY HEADACHE PO) Take 1 Package by mouth daily as needed (headache).     b complex vitamins capsule Take 1 capsule by mouth daily.     BLACK CURRANT SEED OIL PO Take 1 capsule by mouth daily.     Cyanocobalamin (B-12 PO) Take 1 tablet by mouth daily.     oxybutynin (DITROPAN) 5 MG tablet Take 1 tablet (5 mg total) by mouth 3 (three) times daily as needed for bladder spasms. 60 tablet 3   phenazopyridine (PYRIDIUM) 100 MG tablet Take 1 tablet (100 mg total) by mouth 3 (three) times daily as needed for pain. 30 tablet 1   RABEprazole (ACIPHEX) 20 MG tablet Take 1 tablet by mouth once daily 90 tablet 3   oxyCODONE-acetaminophen (PERCOCET) 5-325 MG tablet Take 1 tablet by mouth every 4 (four) hours as needed for severe pain. 30 tablet 0   No current facility-administered medications for this visit.    REVIEW OF SYSTEMS:   Constitutional: Denies fevers, chills or abnormal night sweats Eyes: Denies blurriness of vision, double vision or watery eyes Ears, nose, mouth, throat, and face: Denies mucositis or sore throat Respiratory: Denies cough, dyspnea or wheezes Cardiovascular: Denies palpitation, chest discomfort or lower extremity swelling Gastrointestinal: Positive for constipation and heartburn at times. Skin: Denies abnormal skin rashes Lymphatics: Denies new lymphadenopathy or easy bruising Neurological:Denies numbness, tingling or new weaknesses Behavioral/Psych: Mood is stable, no new changes  All other systems were reviewed with the patient and are negative.  PHYSICAL EXAMINATION: ECOG PERFORMANCE STATUS: 0 -  Asymptomatic  Vitals:   06/03/22 0816  BP: 112/71  Pulse: 85  Resp: 18  Temp: (!) 97.5 F (36.4 C)  SpO2: 99%   Filed Weights   06/03/22 0816  Weight: 219 lb 8 oz (99.6 kg)    GENERAL:alert, no distress and comfortable SKIN: skin color, texture, turgor are normal, no rashes or significant lesions EYES: normal, conjunctiva are pink and non-injected, sclera clear OROPHARYNX:no exudate, no erythema and lips, buccal mucosa, and tongue normal  NECK: supple, thyroid normal size, non-tender, without nodularity LYMPH:  no palpable lymphadenopathy in the cervical, axillary or inguinal LUNGS: clear to auscultation and percussion with normal breathing effort HEART: regular rate & rhythm and no murmurs and no lower extremity edema ABDOMEN:abdomen soft, non-tender and normal bowel sounds.  Right-sided percutaneous nephrostomy tube on the back. Musculoskeletal:no cyanosis of digits and no clubbing  PSYCH: alert & oriented x 3 with fluent speech NEURO: no focal motor/sensory deficits  LABORATORY DATA:  I have reviewed the data as listed Lab Results  Component Value Date   WBC 13.1 (H) 05/26/2022   HGB 15.5 05/26/2022   HCT 46.7 05/26/2022   MCV 91.0 05/26/2022   PLT 206 05/26/2022     Chemistry      Component Value Date/Time   NA 138 05/26/2022 0831   NA 138 05/22/2022 0000   K 3.8 05/26/2022 0831   CL 106 05/26/2022 0831   CO2 25 05/26/2022 0831   BUN 19 05/26/2022 0831   BUN 17 05/22/2022 0000   CREATININE 1.17 05/26/2022 0831      Component Value Date/Time   CALCIUM 9.4 05/26/2022 0831   ALKPHOS 57 04/17/2022 1446   AST 12 (L) 04/17/2022 1446   ALT 14 04/17/2022 1446   BILITOT 0.7 04/17/2022 1446       RADIOGRAPHIC STUDIES: I have personally reviewed the radiological images as listed and agreed with the findings in the report. IR NEPHROSTOMY PLACEMENT RIGHT  Result Date: 05/26/2022 INDICATION: Malignant obstruction.  Urothelial carcinoma. EXAM: ULTRASOUND AND  FLUOROSCOPIC GUIDED PLACEMENT OF RIGHT NEPHROSTOMY TUBE COMPARISON:  CT urogram, 04/17/2022 MEDICATIONS: Rocephin 2 g IV; The antibiotic was administered in an appropriate time frame prior to skin puncture. ANESTHESIA/SEDATION: Moderate (conscious) sedation was employed during this procedure. A total of Versed 4 mg and Fentanyl 200 mcg was administered intravenously. Moderate Sedation Time: 20 minutes. The patient's level of consciousness and vital signs were monitored continuously by radiology nursing throughout the procedure under my direct supervision. CONTRAST:  5 mL Isovue 300 - administered into the renal collecting system FLUOROSCOPY TIME:  Fluoroscopic dose; 3 mGy COMPLICATIONS: None immediate. PROCEDURE: The procedure, risks, benefits, and alternatives were explained to the the patient and/or patient's representative, questions were encouraged and answered and informed consent was obtained. A timeout was performed prior to the initiation of the procedure. The operative site was prepped and draped in the usual sterile fashion and a sterile drape was applied covering the operative field. A sterile gown and sterile gloves were used for the procedure. Local anesthesia was provided with 1% Lidocaine with epinephrine. Ultrasound was used to localize the RIGHT kidney. Under direct ultrasound guidance, a 20 gauge needle was advanced into the renal collecting system. An ultrasound image documentation was performed. Access within the collecting system was confirmed with the efflux of urine followed by limited contrast injection. Under intermittent fluoroscopic guidance, an 0.018 wire was advanced into the collecting system and the tract was dilated with an Accustick stent. Next, over a short Amplatz wire, the track was further dilated ultimately allowing placement of a 10 Fr percutaneous nephrostomy catheter with end coiled and locked within the renal pelvis. Contrast was injected and several spot fluoroscopic images  were obtained in various obliquities. The catheter was secured at the skin entrance site with an interrupted suture and a stat lock device and connected to a gravity bag. Dressings were applied. The patient tolerated procedure well without immediate postprocedural complication. FINDINGS: Ultrasound scanning demonstrates a moderate to severely dilated RIGHT collecting system. Under a combination of ultrasound and fluoroscopic guidance, a posterior inferior calix was targeted allowing placement of a 10 Fr percutaneous nephrostomy catheter with end coiled and locked within the renal pelvis. Contrast injection confirmed appropriate positioning. IMPRESSION: Successful ultrasound and fluoroscopic guided placement of a RIGHT sided 10 Fr percutaneous nephrostomy drainage catheter, as above. PLAN: The patient will return to Vascular Interventional Radiology (VIR) for routine drainage catheter evaluation and exchange in 8  weeks. Michaelle Birks, MD [facility] Radiology Electronically Signed   By: Michaelle Birks M.D.   On: 05/26/2022 17:09   DG C-Arm 1-60 Min-No Report  Result Date: 05/18/2022 Fluoroscopy was utilized by the requesting physician.  No radiographic interpretation.    ASSESSMENT:  1.  Muscle invasive bladder cancer, stage II (T2N0): - TURBT of the right bladder tumor (11/19/2017): Invasive high-grade urothelial carcinoma involving muscularis propria - CTAP without contrast (04/17/2022): 4.2 cm polypoid mass in the right posterior bladder developing since prior study suspicious for recurrent bladder cancer.  Masses causing obstruction of right ureter with severe right hydronephrosis. - TURBT (05/18/2022): Infiltrating high-grade urothelial carcinoma involving muscularis propria.  LVI is identified. - 05/26/2022: Right nephrostomy tube placement - 40 pound weight loss in the last 6 months due to diet change, cutting of sugars/gluten and decreasing quantity of  food  2.  Social/family history: - He lives at home with his wife.  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  He had exposure to oil-based additives, tires seals and other chemicals.  He is a current active smoker, 1 pack/day for the last 37 years. - Mother had colon cancer.   PLAN:  1.  Stage II (T2N0) high-grade urothelial carcinoma of the right bladder: - We reviewed his diagnosis in detail. - Recommend staging scans. - Discussed the role of neoadjuvant cisplatin based combination chemotherapy followed by radical/partial cystectomy.  Also discussed bladder preservation with concurrent chemoradiation therapy and maximal TURBT.  He has adequate renal function but is reluctant to consider chemotherapy at this time.  He does not have any neuropathy.  He has some occasional stuffiness of the left ear without decreased hearing. - He has an appointment to see Dr. Tresa Moore on 06/09/2022 to discuss surgical options in detail. - RTC after imaging and Dr. Zettie Pho visit.   Orders Placed This Encounter  Procedures   NM PET Image Initial (PI) Skull Base To Thigh    Standing Status:   Future    Standing Expiration Date:   06/03/2023    Order Specific Question:   If indicated for the ordered procedure, I authorize the administration of a radiopharmaceutical per Radiology protocol    Answer:   Yes    Order Specific Question:   Preferred imaging location?    Answer:   [facility]    Order Specific Question:   Release to patient    Answer:   Immediate    All questions were answered. The patient knows to call the clinic with any problems, questions or concerns.     Derek Jack, MD 06/03/2022 5:46 PM

## 2022-06-09 ENCOUNTER — Other Ambulatory Visit: Payer: Self-pay

## 2022-06-09 DIAGNOSIS — C678 Malignant neoplasm of overlapping sites of bladder: Secondary | ICD-10-CM | POA: Diagnosis not present

## 2022-06-09 DIAGNOSIS — N130 Hydronephrosis with ureteropelvic junction obstruction: Secondary | ICD-10-CM | POA: Diagnosis not present

## 2022-06-09 DIAGNOSIS — C689 Malignant neoplasm of urinary organ, unspecified: Secondary | ICD-10-CM

## 2022-06-09 NOTE — Progress Notes (Signed)
Orders placed for CT CAP and Bone Scan per Dr. Delton Coombes

## 2022-06-09 NOTE — Progress Notes (Signed)
Notification received that PET scan has been denied. Patient made aware and verbalized understanding. New orders placed per Dr. Delton Coombes. Schedule pending.

## 2022-06-10 ENCOUNTER — Other Ambulatory Visit: Payer: Self-pay | Admitting: Hematology

## 2022-06-10 DIAGNOSIS — C689 Malignant neoplasm of urinary organ, unspecified: Secondary | ICD-10-CM

## 2022-06-11 ENCOUNTER — Encounter (HOSPITAL_COMMUNITY): Payer: Self-pay

## 2022-06-11 ENCOUNTER — Encounter (HOSPITAL_COMMUNITY): Payer: 59

## 2022-06-15 ENCOUNTER — Ambulatory Visit
Admission: RE | Admit: 2022-06-15 | Discharge: 2022-06-15 | Disposition: A | Discharge status: Home / Self Care | Payer: 59 | Source: Ambulatory Visit | Attending: Hematology | Admitting: Hematology

## 2022-06-15 ENCOUNTER — Ambulatory Visit (HOSPITAL_COMMUNITY): Payer: 59

## 2022-06-15 DIAGNOSIS — I700 Atherosclerosis of aorta: Secondary | ICD-10-CM | POA: Diagnosis not present

## 2022-06-15 DIAGNOSIS — N3289 Other specified disorders of bladder: Secondary | ICD-10-CM | POA: Diagnosis not present

## 2022-06-15 DIAGNOSIS — K561 Intussusception: Secondary | ICD-10-CM | POA: Diagnosis not present

## 2022-06-15 DIAGNOSIS — J439 Emphysema, unspecified: Secondary | ICD-10-CM | POA: Diagnosis not present

## 2022-06-15 DIAGNOSIS — C679 Malignant neoplasm of bladder, unspecified: Secondary | ICD-10-CM | POA: Diagnosis not present

## 2022-06-15 DIAGNOSIS — N1330 Unspecified hydronephrosis: Secondary | ICD-10-CM | POA: Diagnosis not present

## 2022-06-15 DIAGNOSIS — C689 Malignant neoplasm of urinary organ, unspecified: Secondary | ICD-10-CM

## 2022-06-15 DIAGNOSIS — N400 Enlarged prostate without lower urinary tract symptoms: Secondary | ICD-10-CM | POA: Diagnosis not present

## 2022-06-15 MED ORDER — IOPAMIDOL (ISOVUE-300) INJECTION 61%
100.0000 mL | Freq: Once | INTRAVENOUS | Status: AC | PRN
  Administered 2022-06-15: 100 mL via INTRAVENOUS

## 2022-06-16 ENCOUNTER — Encounter (HOSPITAL_COMMUNITY)
Admission: RE | Admit: 2022-06-16 | Discharge: 2022-06-16 | Disposition: A | Discharge status: Home / Self Care | Payer: 59 | Source: Ambulatory Visit | Attending: Hematology | Admitting: Hematology

## 2022-06-16 DIAGNOSIS — C689 Malignant neoplasm of urinary organ, unspecified: Secondary | ICD-10-CM | POA: Insufficient documentation

## 2022-06-16 DIAGNOSIS — M19011 Primary osteoarthritis, right shoulder: Secondary | ICD-10-CM | POA: Diagnosis not present

## 2022-06-16 DIAGNOSIS — M19012 Primary osteoarthritis, left shoulder: Secondary | ICD-10-CM | POA: Diagnosis not present

## 2022-06-16 DIAGNOSIS — C679 Malignant neoplasm of bladder, unspecified: Secondary | ICD-10-CM | POA: Diagnosis not present

## 2022-06-16 MED ORDER — TECHNETIUM TC 99M MEDRONATE IV KIT
20.0000 | PACK | Freq: Once | INTRAVENOUS | Status: AC | PRN
  Administered 2022-06-16: 19.5 via INTRAVENOUS

## 2022-06-23 ENCOUNTER — Inpatient Hospital Stay: Payer: 59 | Admitting: Hematology

## 2022-06-23 VITALS — BP 104/80 | HR 92 | Temp 97.2°F | Resp 18 | Ht 75.0 in | Wt 218.8 lb

## 2022-06-23 DIAGNOSIS — R69 Illness, unspecified: Secondary | ICD-10-CM | POA: Diagnosis not present

## 2022-06-23 DIAGNOSIS — Z800 Family history of malignant neoplasm of digestive organs: Secondary | ICD-10-CM | POA: Diagnosis not present

## 2022-06-23 DIAGNOSIS — C689 Malignant neoplasm of urinary organ, unspecified: Secondary | ICD-10-CM | POA: Diagnosis not present

## 2022-06-23 DIAGNOSIS — C679 Malignant neoplasm of bladder, unspecified: Secondary | ICD-10-CM | POA: Diagnosis not present

## 2022-06-23 DIAGNOSIS — C7989 Secondary malignant neoplasm of other specified sites: Secondary | ICD-10-CM | POA: Diagnosis not present

## 2022-06-23 NOTE — Patient Instructions (Addendum)
[facility]  Discharge Instructions  You were seen and examined today by Dr. Delton Coombes.  Dr. Delton Coombes discussed your most recent lab work and scans which revealed no distant spread of cancer. You have been diagnosed with Stage III Urothelial Cancer. It is contained to the region in which is started. This is great news.  Dr. Tresa Moore, Dr. Alyson Ingles and Dr. Delton Coombes all agree that you will need 3 to 4 rounds of chemotherapy prior to going for surgery. Please know that without treatment, the cancer can continue to spread. If the cancer spreads beyond where it is currently - it will no longer be curative.  Since you have decided against chemotherapy, Dr. Delton Coombes has recommended a repeat CT scan in three months to assess the status of the cancer at that time.  Follow-up as scheduled.  Thank you for choosing [facility] to provide your oncology and hematology care.   To afford each patient quality time with our provider, please arrive at least 15 minutes before your scheduled appointment time. You may need to reschedule your appointment if you arrive late (10 or more minutes). Arriving late affects you and other patients whose appointments are after yours.  Also, if you miss three or more appointments without notifying the office, you may be dismissed from the clinic at the provider's discretion.    Again, thank you for choosing [facility].  Our hope is that these requests will decrease the amount of time that you wait before being seen by our physicians.   If you have a lab appointment with the [facility] please come in thru the Main Entrance and check in at the main information desk.           _____________________________________________________________  Should you have questions after your visit to [facility], please contact our office at [phone] and follow the prompts.  Our office hours are 8:00  a.m. to 4:30 p.m. Monday - Thursday and 8:00 a.m. to 2:30 p.m. Friday.  Please note that voicemails left after 4:00 p.m. may not be returned until the following business day.  We are closed weekends and all major holidays.  You do have access to a nurse 24-7, just call the main number to the clinic [phone] and do not press any options, hold on the line and a nurse will answer the phone.    For prescription refill requests, have your pharmacy contact our office and allow 72 hours.    Masks are optional in the cancer centers. If you would like for your care team to wear a mask while they are taking care of you, please let them know. You may have one support person who is at least 57 years old accompany you for your appointments.

## 2022-06-23 NOTE — Progress Notes (Signed)
[address] [place], [place] [id number]   CLINIC:  Medical Oncology/Hematology  PCP:  Cassandria Anger, MD [place] [id number] [phone]   REASON FOR VISIT:  Follow-up for high-grade urothelial bladder cancer  PRIOR THERAPY: TURBT in 2019 and 2023  NGS Results: Not done  CURRENT THERAPY: Surveillance  BRIEF ONCOLOGIC HISTORY:  Oncology History   No history exists.    CANCER STAGING: Cancer Staging  Bladder tumor Staging form: Urinary Bladder, AJCC 8th Edition - Clinical stage from 06/03/2022: Stage II (cT2, cN0, cM0) - Unsigned    INTERVAL HISTORY:  Mr. Mccamish 56 y.o. male returns for follow-up of high-grade urothelial carcinoma of the bladder.  He was seen by Dr. Tresa Moore and was recommended neoadjuvant chemotherapy followed by cystectomy.    REVIEW OF SYSTEMS:  Review of Systems  Psychiatric/Behavioral:  Positive for depression. The patient is nervous/anxious.   All other systems reviewed and are negative.    PAST MEDICAL/SURGICAL HISTORY:  Past Medical History:  Diagnosis Date   Abscess    Right buttocks   Bladder tumor    Depression    Son passed 2014   Ear infection    Eating disorder    Frequent headaches    GERD (gastroesophageal reflux disease)    Helicobacter pylori ab+ 10/27/2017   Hepatitis    childhood, jaundice   Migraines    Urinary incontinence    Varicose vein of leg    Right leg   Vitamin D deficiency    Past Surgical History:  Procedure Laterality Date   CYSTOSCOPY W/ URETERAL STENT PLACEMENT Right 11/19/2017   Procedure: CYSTOSCOPY WITH RETROGRADE PYELOGRAM/URETERAL STENT PLACEMENT;  Surgeon: Ceasar Mons, MD;  Location: [facility];  Service: Urology;  Laterality: Right;   CYSTOSCOPY WITH FULGERATION N/A 11/19/2017   Procedure: [place] AND CLOT EVACUATION;  Surgeon: Nickie Retort, MD;  Location: [facility];  Service: Urology;   Laterality: N/A;   IR NEPHROSTOMY PLACEMENT RIGHT  05/26/2022   TRANSURETHRAL RESECTION OF BLADDER TUMOR N/A 11/19/2017   Procedure: TRANSURETHRAL RESECTION OF BLADDER TUMOR (TURBT);  Surgeon: Ceasar Mons, MD;  Location: [facility];  Service: Urology;  Laterality: N/A;   TRANSURETHRAL RESECTION OF BLADDER TUMOR N/A 05/18/2022   Procedure: TRANSURETHRAL RESECTION OF BLADDER TUMOR (TURBT);  Surgeon: Cleon Gustin, MD;  Location: [facility];  Service: Urology;  Laterality: N/A;     SOCIAL HISTORY:  Social History   Socioeconomic History   Marital status: Married    Spouse name: Not on file   Number of children: Not on file   Years of education: Not on file   Highest education level: Not on file  Occupational History   Not on file  Tobacco Use   Smoking status: Every Day    Packs/day: 1.00    Years: 30.00    Total pack years: 30.00    Types: Cigarettes   Smokeless tobacco: Never  Vaping Use   Vaping Use: Never used  Substance and Sexual Activity   Alcohol use: Yes    Comment: rare   Drug use: No   Sexual activity: Not on file  Other Topics Concern   Not on file  Social History Narrative   Not on file   Social Determinants of Health   Financial Resource Strain: Not on file  Food Insecurity: Not on file  Transportation Needs: Not on file  Physical Activity: Not on  file  Stress: Not on file  Social Connections: Not on file  Intimate Partner Violence: Not on file    FAMILY HISTORY:  Family History  Problem Relation Age of Onset   Cancer Mother    Depression Mother    Arthritis Father    Depression Father    Hearing loss Father    Depression Daughter    Early death Son     CURRENT MEDICATIONS:  Outpatient Encounter Medications as of 06/23/2022  Medication Sig   Aspirin-Acetaminophen-Caffeine (GOODY HEADACHE PO) Take 1 Package by mouth daily as needed (headache).   b complex vitamins capsule Take 1 capsule by mouth daily.   BLACK  CURRANT SEED OIL PO Take 1 capsule by mouth daily.   Cyanocobalamin (B-12 PO) Take 1 tablet by mouth daily.   oxybutynin (DITROPAN) 5 MG tablet Take 1 tablet (5 mg total) by mouth 3 (three) times daily as needed for bladder spasms.   RABEprazole (ACIPHEX) 20 MG tablet Take 1 tablet by mouth once daily   [DISCONTINUED] Ascorbic Acid (VITAMIN C PO) Take 1 tablet by mouth daily.   [DISCONTINUED] oxyCODONE-acetaminophen (PERCOCET) 5-325 MG tablet Take 1 tablet by mouth every 4 (four) hours as needed for severe pain.   [DISCONTINUED] phenazopyridine (PYRIDIUM) 100 MG tablet Take 1 tablet (100 mg total) by mouth 3 (three) times daily as needed for pain.   No facility-administered encounter medications on file as of 06/23/2022.    ALLERGIES:  Allergies  Allergen Reactions   Macrobid [Nitrofurantoin]     ?epig abd pain     PHYSICAL EXAM:  ECOG Performance status: 0  Vitals:   06/23/22 1025  BP: 104/80  Pulse: 92  Resp: 18  Temp: (!) 97.2 F (36.2 C)  SpO2: 100%   Filed Weights   06/23/22 1025  Weight: 218 lb 12.8 oz (99.2 kg)   Physical Exam Vitals reviewed.  Constitutional:      Appearance: Normal appearance.  Cardiovascular:     Rate and Rhythm: Normal rate and regular rhythm.     Heart sounds: Normal heart sounds.  Pulmonary:     Breath sounds: Normal breath sounds.  Abdominal:     Palpations: Abdomen is soft.  Neurological:     Mental Status: He is alert.  Psychiatric:        Mood and Affect: Mood normal.        Behavior: Behavior normal.      LABORATORY DATA:  I have reviewed the labs as listed.  CBC    Component Value Date/Time   WBC 13.1 (H) 05/26/2022 0831   RBC 5.13 05/26/2022 0831   HGB 15.5 05/26/2022 0831   HCT 46.7 05/26/2022 0831   PLT 206 05/26/2022 0831   MCV 91.0 05/26/2022 0831   MCH 30.2 05/26/2022 0831   MCHC 33.2 05/26/2022 0831   RDW 12.9 05/26/2022 0831   LYMPHSABS 1.6 05/26/2022 0831   MONOABS 0.9 05/26/2022 0831   EOSABS 0.1  05/26/2022 0831   BASOSABS 0.1 05/26/2022 0831      Latest Ref Rng & Units 05/26/2022    8:31 AM 05/22/2022   12:00 AM 04/17/2022    2:46 PM  CMP  Glucose 70 - 99 mg/dL 109  98  98   BUN 6 - 20 mg/dL '19  17  16   '$ Creatinine 0.61 - 1.24 mg/dL 1.17  0.98  1.22   Sodium 135 - 145 mmol/L 138  138  135   Potassium 3.5 - 5.1 mmol/L 3.8  4.8  3.9   Chloride 98 - 111 mmol/L 106  100  106   CO2 22 - 32 mmol/L '25  21  21   '$ Calcium 8.9 - 10.3 mg/dL 9.4  10.2  9.1   Total Protein 6.5 - 8.1 g/dL   7.2   Total Bilirubin 0.3 - 1.2 mg/dL   0.7   Alkaline Phos 38 - 126 U/L   57   AST 15 - 41 U/L   12   ALT 0 - 44 U/L   14     DIAGNOSTIC IMAGING:  I have independently reviewed the scans and discussed with the patient.  ASSESSMENT: 1.  Muscle invasive bladder cancer, stage III (T3N0): - TURBT of the right bladder tumor (11/19/2017): Invasive high-grade urothelial carcinoma involving muscularis propria - CTAP without contrast (04/17/2022): 4.2 cm polypoid mass in the right posterior bladder developing since prior study suspicious for recurrent bladder cancer.  Masses causing obstruction of right ureter with severe right hydronephrosis. - TURBT (05/18/2022): Infiltrating high-grade urothelial carcinoma involving muscularis propria.  LVI is identified. - 05/26/2022: Right nephrostomy tube placement - 40 pound weight loss in the last 6 months due to diet change, cutting of sugars/gluten and decreasing quantity of food   2.  Social/family history: - He lives at home with his wife.  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  He had exposure to oil-based additives, tires seals and other chemicals.  He is a current active smoker, 1 pack/day for the last 37 years. - Mother had colon cancer.     PLAN:  1.  Stage III (T3N0) high-grade urothelial carcinoma of the right bladder: - CT CAP (06/15/2022): Right-sided bladder cancer involving distal right ureter.  Resolution of right  hydronephrosis following percutaneous nephrostomy placement.  No evidence of metastatic disease in the chest, abdomen or pelvis. - Bone scan (06/16/2022): No evidence of metastatic disease. - He was evaluated by Dr. Tresa Moore and was recommended neoadjuvant chemotherapy followed by cystoprostatectomy/conduit diversion (not candidate for continent diversion as stage III). - We discussed the role of neoadjuvant cisplatin based combination chemotherapy.  We discussed side effects also.  Patient reports that he is been doing a lot of prayer and is not interested in taking chemotherapy at this time. - He wants to have percutaneous nephrostomy removed with possible stent placement in the right distal ureter.  He is scheduled for IR follow-up on 07/20/2022. - He is agreeable to repeating CT scan in 3 months.      Orders placed this encounter:  Orders Placed This Encounter  Procedures   CT CHEST ABDOMEN PELVIS W CONTRAST   CBC with Differential   Comprehensive metabolic panel      Derek Jack, MD [place] [phone]

## 2022-06-24 ENCOUNTER — Ambulatory Visit (INDEPENDENT_AMBULATORY_CARE_PROVIDER_SITE_OTHER): Payer: 59 | Admitting: Urology

## 2022-06-24 ENCOUNTER — Encounter: Payer: Self-pay | Admitting: Urology

## 2022-06-24 VITALS — BP 108/73 | HR 90

## 2022-06-24 DIAGNOSIS — C672 Malignant neoplasm of lateral wall of bladder: Secondary | ICD-10-CM

## 2022-06-24 NOTE — Patient Instructions (Signed)

## 2022-06-24 NOTE — Progress Notes (Signed)
06/24/2022 8:48 AM   Christian Kennedy 1966-06-12 [id number]  Referring provider: Cassandria Anger, MD [place],  [place] [address]  Followup muscle invasive bladder cancer   HPI: [name] is a 56yo here for followup for muscle invasive bladder.  He met with Dr. Tresa Moore who recommended neoadjuvant chemotherapy followup by radical cystectomy. He met with Dr. Delton Coombes and he has elected to proceed with surgery and to not have neoadjuvant chemotherapy. He has a right nephrostomy tube in place. He uses oxybutynin PRN for bladder spasms. CT 9/18 and bone scan 9/19 showed no evidence of metastatic disease. No other complaints today   PMH: Past Medical History:  Diagnosis Date   Abscess    Right buttocks   Bladder tumor    Depression    Son passed 2014   Ear infection    Eating disorder    Frequent headaches    GERD (gastroesophageal reflux disease)    Helicobacter pylori ab+ 10/27/2017   Hepatitis    childhood, jaundice   Migraines    Urinary incontinence    Varicose vein of leg    Right leg   Vitamin D deficiency     Surgical History: Past Surgical History:  Procedure Laterality Date   CYSTOSCOPY W/ URETERAL STENT PLACEMENT Right 11/19/2017   Procedure: CYSTOSCOPY WITH RETROGRADE PYELOGRAM/URETERAL STENT PLACEMENT;  Surgeon: Ceasar Mons, MD;  Location: [facility];  Service: Urology;  Laterality: Right;   CYSTOSCOPY WITH FULGERATION N/A 11/19/2017   Procedure: [place] AND CLOT EVACUATION;  Surgeon: Nickie Retort, MD;  Location: [facility];  Service: Urology;  Laterality: N/A;   IR NEPHROSTOMY PLACEMENT RIGHT  05/26/2022   TRANSURETHRAL RESECTION OF BLADDER TUMOR N/A 11/19/2017   Procedure: TRANSURETHRAL RESECTION OF BLADDER TUMOR (TURBT);  Surgeon: Ceasar Mons, MD;  Location: [facility];  Service: Urology;  Laterality: N/A;   TRANSURETHRAL RESECTION OF BLADDER TUMOR N/A  05/18/2022   Procedure: TRANSURETHRAL RESECTION OF BLADDER TUMOR (TURBT);  Surgeon: Cleon Gustin, MD;  Location: [facility];  Service: Urology;  Laterality: N/A;    Home Medications:  Allergies as of 06/24/2022       Reactions   Macrobid [nitrofurantoin]    ?epig abd pain        Medication List        Accurate as of June 24, 2022  8:48 AM. If you have any questions, ask your nurse or doctor.          b complex vitamins capsule Take 1 capsule by mouth daily.   B-12 PO Take 1 tablet by mouth daily.   BLACK CURRANT SEED OIL PO Take 1 capsule by mouth daily.   GOODY HEADACHE PO Take 1 Package by mouth daily as needed (headache).   oxybutynin 5 MG tablet Commonly known as: DITROPAN Take 1 tablet (5 mg total) by mouth 3 (three) times daily as needed for bladder spasms.   RABEprazole 20 MG tablet Commonly known as: ACIPHEX Take 1 tablet by mouth once daily        Allergies:  Allergies  Allergen Reactions   Macrobid [Nitrofurantoin]     ?epig abd pain    Family History: Family History  Problem Relation Age of Onset   Cancer Mother    Depression Mother    Arthritis Father    Depression Father    Hearing loss Father    Depression Daughter    Early death Son  Social History:  reports that he has been smoking cigarettes. He has a 30.00 pack-year smoking history. He has never used smokeless tobacco. He reports current alcohol use. He reports that he does not use drugs.  ROS: All other review of systems were reviewed and are negative except what is noted above in HPI  Physical Exam: BP 108/73   Pulse 90   Constitutional:  Alert and oriented, No acute distress. HEENT: [place] AT, moist mucus membranes.  Trachea midline, no masses. Cardiovascular: No clubbing, cyanosis, or edema. Respiratory: Normal respiratory effort, no increased work of breathing. GI: Abdomen is soft, nontender, nondistended, no abdominal masses GU: No CVA tenderness.  Lymph: No  cervical or inguinal lymphadenopathy. Skin: No rashes, bruises or suspicious lesions. Neurologic: Grossly intact, no focal deficits, moving all 4 extremities. Psychiatric: Normal mood and affect.  Laboratory Data: Lab Results  Component Value Date   WBC 13.1 (H) 05/26/2022   HGB 15.5 05/26/2022   HCT 46.7 05/26/2022   MCV 91.0 05/26/2022   PLT 206 05/26/2022    Lab Results  Component Value Date   CREATININE 1.17 05/26/2022    Lab Results  Component Value Date   PSA 1.18 10/08/2021   PSA 1.30 09/15/2017    Lab Results  Component Value Date   TESTOSTERONE 261.70 (L) 09/15/2017    No results found for: "HGBA1C"  Urinalysis    Component Value Date/Time   COLORURINE STRAW (A) 04/17/2022 1811   APPEARANCEUR CLEAR 04/17/2022 1811   APPEARANCEUR Clear 04/13/2022 1043   LABSPEC 1.002 (L) 04/17/2022 1811   PHURINE 6.0 04/17/2022 1811   GLUCOSEU NEGATIVE 04/17/2022 1811   GLUCOSEU NEGATIVE 03/18/2022 0934   HGBUR LARGE (A) 04/17/2022 1811   BILIRUBINUR NEGATIVE 04/17/2022 1811   BILIRUBINUR Negative 04/13/2022 1043   KETONESUR NEGATIVE 04/17/2022 1811   PROTEINUR NEGATIVE 04/17/2022 1811   UROBILINOGEN 0.2 03/18/2022 0934   NITRITE NEGATIVE 04/17/2022 1811   LEUKOCYTESUR NEGATIVE 04/17/2022 1811    Lab Results  Component Value Date   LABMICR See below: 04/13/2022   WBCUA 6-10 (A) 04/13/2022   LABEPIT 0-10 04/13/2022   BACTERIA NONE SEEN 04/17/2022    Pertinent Imaging: CT 9/18 and bone scan 9/19: Images reviewed and discussed with the patient No results found for this or any previous visit.  No results found for this or any previous visit.  No results found for this or any previous visit.  No results found for this or any previous visit.  No results found for this or any previous visit.  No valid procedures specified. No results found for this or any previous visit.  No results found for this or any previous visit.   Assessment & Plan:    1.  Malignant neoplasm of lateral wall of urinary bladder (HCC) -Management per Dr. Tresa Moore. He will be scheduled for right nephrostomy tube exchange in 10/23 - Urinalysis, Routine w reflex microscopic   No follow-ups on file.  [name], MD  [facility] [address]

## 2022-07-07 ENCOUNTER — Encounter: Payer: Self-pay | Admitting: Internal Medicine

## 2022-07-07 ENCOUNTER — Ambulatory Visit (INDEPENDENT_AMBULATORY_CARE_PROVIDER_SITE_OTHER): Payer: 59 | Admitting: Internal Medicine

## 2022-07-07 VITALS — BP 130/78 | HR 79 | Temp 97.9°F | Ht 75.0 in | Wt 219.6 lb

## 2022-07-07 DIAGNOSIS — R634 Abnormal weight loss: Secondary | ICD-10-CM

## 2022-07-07 DIAGNOSIS — D494 Neoplasm of unspecified behavior of bladder: Secondary | ICD-10-CM

## 2022-07-07 DIAGNOSIS — C689 Malignant neoplasm of urinary organ, unspecified: Secondary | ICD-10-CM | POA: Diagnosis not present

## 2022-07-07 LAB — CBC WITH DIFFERENTIAL/PLATELET
Basophils Absolute: 0.1 10*3/uL (ref 0.0–0.1)
Basophils Relative: 0.6 % (ref 0.0–3.0)
Eosinophils Absolute: 0.1 10*3/uL (ref 0.0–0.7)
Eosinophils Relative: 1.2 % (ref 0.0–5.0)
HCT: 41.2 % (ref 39.0–52.0)
Hemoglobin: 14.2 g/dL (ref 13.0–17.0)
Lymphocytes Relative: 22.1 % (ref 12.0–46.0)
Lymphs Abs: 2.4 10*3/uL (ref 0.7–4.0)
MCHC: 34.4 g/dL (ref 30.0–36.0)
MCV: 88.3 fl (ref 78.0–100.0)
Monocytes Absolute: 0.5 10*3/uL (ref 0.1–1.0)
Monocytes Relative: 5 % (ref 3.0–12.0)
Neutro Abs: 7.7 10*3/uL (ref 1.4–7.7)
Neutrophils Relative %: 71.1 % (ref 43.0–77.0)
Platelets: 240 10*3/uL (ref 150.0–400.0)
RBC: 4.67 Mil/uL (ref 4.22–5.81)
RDW: 14.3 % (ref 11.5–15.5)
WBC: 10.8 10*3/uL — ABNORMAL HIGH (ref 4.0–10.5)

## 2022-07-07 LAB — TSH: TSH: 0.51 u[IU]/mL (ref 0.35–5.50)

## 2022-07-07 LAB — COMPREHENSIVE METABOLIC PANEL
ALT: 13 U/L (ref 0–53)
AST: 12 U/L (ref 0–37)
Albumin: 4.2 g/dL (ref 3.5–5.2)
Alkaline Phosphatase: 50 U/L (ref 39–117)
BUN: 15 mg/dL (ref 6–23)
CO2: 30 mEq/L (ref 19–32)
Calcium: 9.9 mg/dL (ref 8.4–10.5)
Chloride: 102 mEq/L (ref 96–112)
Creatinine, Ser: 0.99 mg/dL (ref 0.40–1.50)
GFR: 84.68 mL/min (ref >60.00)
Glucose, Bld: 100 mg/dL — ABNORMAL HIGH (ref 70–99)
Potassium: 4.6 mEq/L (ref 3.5–5.1)
Sodium: 138 mEq/L (ref 135–145)
Total Bilirubin: 0.5 mg/dL (ref 0.2–1.2)
Total Protein: 7.1 g/dL (ref 6.0–8.3)

## 2022-07-07 LAB — VITAMIN B12: Vitamin B-12: 703 pg/mL (ref 211–911)

## 2022-07-07 LAB — VITAMIN D 25 HYDROXY (VIT D DEFICIENCY, FRACTURES): VITD: 26.82 ng/mL — ABNORMAL LOW (ref 30.00–100.00)

## 2022-07-07 MED ORDER — VITAMIN D (ERGOCALCIFEROL) 1.25 MG (50000 UNIT) PO CAPS: 50000.0000 [IU] | ORAL_CAPSULE | ORAL | 0 refills | Status: DC

## 2022-07-07 MED ORDER — SODIUM CHLORIDE 0.9% FLUSH: INTRAVENOUS | 5 refills | Status: DC

## 2022-07-07 MED ORDER — ZOLPIDEM TARTRATE 10 MG PO TABS: 5.0000 mg | ORAL_TABLET | Freq: Every evening | ORAL | 3 refills | Status: DC | PRN

## 2022-07-07 NOTE — Progress Notes (Signed)
Subjective:  Patient ID: Christian Kennedy, male    DOB: 01-12-1966  Age: 56 y.o. MRN: [id number]  CC: Follow-up (2 month f/u)   HPI Christian Kennedy presents for bladder cancer, weight loss, urinary urgency.  Outpatient Medications Prior to Visit  Medication Sig Dispense Refill   Aspirin-Acetaminophen-Caffeine (GOODY HEADACHE PO) Take 1 Package by mouth daily as needed (headache).     b complex vitamins capsule Take 1 capsule by mouth daily.     BLACK CURRANT SEED OIL PO Take 1 capsule by mouth daily.     Cyanocobalamin (B-12 PO) Take 1 tablet by mouth daily.     RABEprazole (ACIPHEX) 20 MG tablet Take 1 tablet by mouth once daily (Patient taking differently: PRN) 90 tablet 3   oxybutynin (DITROPAN) 5 MG tablet Take 1 tablet (5 mg total) by mouth 3 (three) times daily as needed for bladder spasms. (Patient not taking: Reported on 06/24/2022) 60 tablet 3   No facility-administered medications prior to visit.    ROS: Review of Systems  Constitutional:  Negative for appetite change, fatigue and unexpected weight change.  HENT:  Negative for congestion, nosebleeds, sneezing, sore throat and trouble swallowing.   Eyes:  Negative for itching and visual disturbance.  Respiratory:  Negative for cough.   Cardiovascular:  Negative for chest pain, palpitations and leg swelling.  Gastrointestinal:  Negative for abdominal distention, blood in stool, diarrhea and nausea.  Genitourinary:  Negative for frequency and hematuria.  Musculoskeletal:  Negative for back pain, gait problem, joint swelling and neck pain.  Skin:  Negative for rash.  Neurological:  Negative for dizziness, tremors, speech difficulty and weakness.  Psychiatric/Behavioral:  Negative for agitation, dysphoric mood, sleep disturbance and suicidal ideas. The patient is not nervous/anxious.     Objective:  BP 130/78 (BP Location: Left Arm)   Pulse 79   Temp 97.9 F (36.6 C) (Oral)   Ht '6\' 3"'$  (1.905 m)   Wt 219 lb 9.6 oz  (99.6 kg)   SpO2 97%   BMI 27.45 kg/m   BP Readings from Last 3 Encounters:  07/07/22 130/78  06/24/22 108/73  06/23/22 104/80    Wt Readings from Last 3 Encounters:  07/07/22 219 lb 9.6 oz (99.6 kg)  06/23/22 218 lb 12.8 oz (99.2 kg)  06/03/22 219 lb 8 oz (99.6 kg)    Physical Exam Constitutional:      General: He is not in acute distress.    Appearance: He is well-developed.     Comments: NAD  Eyes:     Conjunctiva/sclera: Conjunctivae normal.     Pupils: Pupils are equal, round, and reactive to light.  Neck:     Thyroid: No thyromegaly.     Vascular: No JVD.  Cardiovascular:     Rate and Rhythm: Normal rate and regular rhythm.     Heart sounds: Normal heart sounds. No murmur heard.    No friction rub. No gallop.  Pulmonary:     Effort: Pulmonary effort is normal. No respiratory distress.     Breath sounds: Normal breath sounds. No wheezing or rales.  Chest:     Chest wall: No tenderness.  Abdominal:     General: Bowel sounds are normal. There is no distension.     Palpations: Abdomen is soft. There is no mass.     Tenderness: There is no abdominal tenderness. There is no guarding or rebound.  Musculoskeletal:        General: No tenderness. Normal range of  motion.     Cervical back: Normal range of motion.  Lymphadenopathy:     Cervical: No cervical adenopathy.  Skin:    General: Skin is warm and dry.     Findings: No rash.  Neurological:     Mental Status: He is alert and oriented to person, place, and time.     Cranial Nerves: No cranial nerve deficit.     Motor: No abnormal muscle tone.     Coordination: Coordination normal.     Gait: Gait normal.     Deep Tendon Reflexes: Reflexes are normal and symmetric.  Psychiatric:        Behavior: Behavior normal.        Thought Content: Thought content normal.        Judgment: Judgment normal.   Thin.  No acute distress Nephrostomy tube on the right.  Clear urine with some sediment    A total time of >45  minutes was spent preparing to see the patient, reviewing tests, x-rays, operative reports and other medical records.  Also, obtaining history and performing comprehensive physical exam.  Additionally, counseling the patient regarding the above listed issues.   Finally, documenting clinical information in the health records, coordination of care, educating the patient regarding the need for adjuvant chemotherapy and surgery. It is a complex case.   Lab Results  Component Value Date   WBC 10.8 (H) 07/07/2022   HGB 14.2 07/07/2022   HCT 41.2 07/07/2022   PLT 240.0 07/07/2022   GLUCOSE 100 (H) 07/07/2022   CHOL 200 10/08/2021   TRIG 248.0 (H) 10/08/2021   HDL 34.60 (L) 10/08/2021   LDLDIRECT 132.0 10/08/2021   ALT 13 07/07/2022   AST 12 07/07/2022   NA 138 07/07/2022   K 4.6 07/07/2022   CL 102 07/07/2022   CREATININE 0.99 07/07/2022   BUN 15 07/07/2022   CO2 30 07/07/2022   TSH 0.51 07/07/2022   PSA 1.18 10/08/2021   INR 1.1 05/26/2022    NM Bone Scan Whole Body  Result Date: 06/18/2022 CLINICAL DATA:  Invasive bladder cancer monitor. EXAM: NUCLEAR MEDICINE WHOLE BODY BONE SCAN TECHNIQUE: Whole body anterior and posterior images were obtained approximately 3 hours after intravenous injection of radiopharmaceutical. RADIOPHARMACEUTICALS:  19.5 mCi Technetium-72mMDP IV COMPARISON:  CT chest abdomen and pelvis June 15, 2022 FINDINGS: No abnormal increased radiotracer uptake to suggest osseous metastasis. Mild degenerative uptake is identified in bilateral shoulders. Radiotracer in the kidney drain projects over the upper to mid left leg. There is a very mild foci of radiotracer projected over the left mid to lower thigh, favor overlying contaminant. IMPRESSION: No evidence of osseous metastasis. Electronically Signed   By: WAbelardo DieselM.D.   On: 06/18/2022 12:13    Assessment & Plan:   Problem List Items Addressed This Visit     Bladder tumor - Primary   Relevant Orders   CBC  with Differential/Platelet (Completed)   Comprehensive metabolic panel (Completed)   Vitamin B12 (Completed)   VITAMIN D 25 Hydroxy (Vit-D Deficiency, Fractures) (Completed)   Urothelial cancer (HDunbar    Per Dr KDelton Coombes Stage III (T3N0) high-grade urothelial carcinoma of the right bladder: - CT CAP (06/15/2022): Right-sided bladder cancer involving distal right ureter.  Resolution of right hydronephrosis following percutaneous nephrostomy placement.  No evidence of metastatic disease in the chest, abdomen or pelvis. - Bone scan (06/16/2022): No evidence of metastatic disease. - He was evaluated by Dr. MTresa Mooreand was recommended neoadjuvant chemotherapy followed by  cystoprostatectomy/conduit diversion (not candidate for continent diversion as stage III). - We discussed the role of neoadjuvant cisplatin based combination chemotherapy.  We discussed side effects also.  Patient reports that he is been doing a lot of prayer and is not interested in taking chemotherapy at this time. - He wants to have percutaneous nephrostomy removed with possible stent placement in the right distal ureter.  He is scheduled for IR follow-up on 07/20/2022. - He is agreeable to repeating CT scan in 3 months.  We had the long discussion with the patient regarding his bladder cancer.  I expressed my opinion that he needs to have adjuvant chemotherapy  prior to his surgery. He seems to understand the reasoning.  He will have to think about it again and let Dr Delton Coombes know..          Weight loss    Wt Readings from Last 3 Encounters:  07/07/22 219 lb 9.6 oz (99.6 kg)  06/23/22 218 lb 12.8 oz (99.2 kg)  06/03/22 219 lb 8 oz (99.6 kg)  The patient states his appetite is good now.  He has been eating.       Relevant Orders   CBC with Differential/Platelet (Completed)   Comprehensive metabolic panel (Completed)   Vitamin B12 (Completed)   VITAMIN D 25 Hydroxy (Vit-D Deficiency, Fractures) (Completed)   TSH  (Completed)      Meds ordered this encounter  Medications   sodium chloride flush (NS) 0.9 % SOLN    Sig: 10 ml syr use as directed    Dispense:  50 Syringe    Refill:  5   zolpidem (AMBIEN) 10 MG tablet    Sig: Take 0.5-1 tablets (5-10 mg total) by mouth at bedtime as needed for sleep.    Dispense:  30 tablet    Refill:  3      Follow-up: No follow-ups on file.  Walker Kehr, MD

## 2022-07-07 NOTE — Assessment & Plan Note (Addendum)
Wt Readings from Last 3 Encounters:  07/07/22 219 lb 9.6 oz (99.6 kg)  06/23/22 218 lb 12.8 oz (99.2 kg)  06/03/22 219 lb 8 oz (99.6 kg)  The patient states his appetite is good now.  He has been eating.

## 2022-07-07 NOTE — Assessment & Plan Note (Addendum)
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  Resolution of right hydronephrosis following percutaneous nephrostomy placement.  No evidence of metastatic disease in the chest, abdomen or pelvis. - Bone scan (06/16/2022): No evidence of metastatic disease. - He was evaluated by Dr. Tresa Moore and was recommended neoadjuvant chemotherapy followed by cystoprostatectomy/conduit diversion (not candidate for continent diversion as stage III). - We discussed the role of neoadjuvant cisplatin based combination chemotherapy.  We discussed side effects also.  Patient reports that he is been doing a lot of prayer and is not interested in taking chemotherapy at this time. - He wants to have percutaneous nephrostomy removed with possible stent placement in the right distal ureter.  He is scheduled for IR follow-up on 07/20/2022. - He is agreeable to repeating CT scan in 3 months.  We had the long discussion with the patient regarding his bladder cancer.  I expressed my opinion that he needs to have adjuvant chemotherapy  prior to his surgery. He seems to understand the reasoning.  He will have to think about it again and let Dr Delton Coombes know.Marland Kitchen

## 2022-07-07 NOTE — Patient Instructions (Signed)
Blue-Emu cream -- use 2-3 times a day ? ?

## 2022-07-17 ENCOUNTER — Other Ambulatory Visit: Payer: Self-pay | Admitting: Student

## 2022-07-17 DIAGNOSIS — D494 Neoplasm of unspecified behavior of bladder: Secondary | ICD-10-CM

## 2022-07-20 ENCOUNTER — Other Ambulatory Visit (HOSPITAL_COMMUNITY): Payer: Self-pay | Admitting: Interventional Radiology

## 2022-07-20 ENCOUNTER — Ambulatory Visit (HOSPITAL_COMMUNITY)
Admission: RE | Admit: 2022-07-20 | Discharge: 2022-07-20 | Disposition: A | Discharge status: Home / Self Care | Payer: 59 | Source: Ambulatory Visit | Attending: Interventional Radiology | Admitting: Interventional Radiology

## 2022-07-20 ENCOUNTER — Encounter (HOSPITAL_COMMUNITY): Payer: Self-pay

## 2022-07-20 ENCOUNTER — Other Ambulatory Visit: Payer: Self-pay

## 2022-07-20 ENCOUNTER — Ambulatory Visit (HOSPITAL_COMMUNITY)
Admission: RE | Admit: 2022-07-20 | Discharge: 2022-07-20 | Disposition: A | Discharge status: Home / Self Care | Payer: 59 | Source: Ambulatory Visit

## 2022-07-20 DIAGNOSIS — C689 Malignant neoplasm of urinary organ, unspecified: Secondary | ICD-10-CM | POA: Insufficient documentation

## 2022-07-20 DIAGNOSIS — Z466 Encounter for fitting and adjustment of urinary device: Secondary | ICD-10-CM | POA: Diagnosis not present

## 2022-07-20 DIAGNOSIS — D494 Neoplasm of unspecified behavior of bladder: Secondary | ICD-10-CM

## 2022-07-20 DIAGNOSIS — Z436 Encounter for attention to other artificial openings of urinary tract: Secondary | ICD-10-CM | POA: Diagnosis not present

## 2022-07-20 DIAGNOSIS — F32A Depression, unspecified: Secondary | ICD-10-CM | POA: Diagnosis not present

## 2022-07-20 DIAGNOSIS — K219 Gastro-esophageal reflux disease without esophagitis: Secondary | ICD-10-CM | POA: Diagnosis not present

## 2022-07-20 DIAGNOSIS — N1330 Unspecified hydronephrosis: Secondary | ICD-10-CM | POA: Diagnosis not present

## 2022-07-20 DIAGNOSIS — N320 Bladder-neck obstruction: Secondary | ICD-10-CM | POA: Diagnosis not present

## 2022-07-20 DIAGNOSIS — R69 Illness, unspecified: Secondary | ICD-10-CM | POA: Diagnosis not present

## 2022-07-20 HISTORY — PX: IR NEPHROSTOMY EXCHANGE RIGHT: IMG6070

## 2022-07-20 MED ORDER — FENTANYL CITRATE (PF) 100 MCG/2ML IJ SOLN
INTRAMUSCULAR | Status: AC
  Filled 2022-07-20: qty 4

## 2022-07-20 MED ORDER — IOHEXOL 300 MG/ML  SOLN
50.0000 mL | Freq: Once | INTRAMUSCULAR | Status: AC | PRN
  Administered 2022-07-20: 10 mL

## 2022-07-20 MED ORDER — SODIUM CHLORIDE 0.9 % IV SOLN: INTRAVENOUS | Status: DC

## 2022-07-20 MED ORDER — LIDOCAINE HCL 1 % IJ SOLN: 20.0000 mL | Freq: Once | INTRAMUSCULAR | Status: DC

## 2022-07-20 MED ORDER — LEVOFLOXACIN IN D5W 500 MG/100ML IV SOLN
500.0000 mg | Freq: Once | INTRAVENOUS | Status: AC
  Administered 2022-07-20: 500 mg via INTRAVENOUS
  Filled 2022-07-20 (×2): qty 100

## 2022-07-20 MED ORDER — LIDOCAINE HCL 1 % IJ SOLN
INTRAMUSCULAR | Status: AC
  Administered 2022-07-20: 7 mL via INTRADERMAL
  Filled 2022-07-20: qty 20

## 2022-07-20 MED ORDER — MIDAZOLAM HCL 2 MG/2ML IJ SOLN
INTRAMUSCULAR | Status: AC
  Filled 2022-07-20: qty 4

## 2022-07-20 NOTE — Discharge Instructions (Addendum)
Biliary Drainage Catheter Placement, Care After This sheet gives you information about how to care for yourself after your procedure. Your health care provider may also give you more specific instructions. If you have problems or questions, contact your health careprovider. What can I expect after the procedure? After the procedure, it is common to have: Pain or soreness at the catheter insertion site. Tiredness and sleepiness for several hours. Some bruising at the catheter insertion site. Drainage into the collection bag on the outside of your body, if you have an external drainage catheter or an internal-external drainage catheter. You might see bloody discharge in the bag for the first 1 or 2 days. Then, the discharge should turn a yellow-green color. Follow these instructions at home: Medicines Take over-the-counter and prescription medicines only as told by your health care provider. Do not take aspirin or blood thinners unless your health care provider says that you can. These can make bleeding worse. Ask your health care provider if the medicine prescribed to you requires you to avoid driving or using machinery. Eating and drinking Drink enough fluid to keep your urine pale yellow. Resume your usual diet as told by your health care provider.   Catheter insertion site care: Clean your catheter insertion site as told by your health care provider. Do not take baths, swim, or use a hot tub until your health care provider approves. Take showers only. Before showering, cover your catheter insertion site with a watertight covering to keep the area dry. Keep the skin around your catheter insertion site dry. If the area gets wet, dry the skin completely. Check your catheter insertion site every day for signs of infection. Check for: More redness, swelling, or pain. Fluid or blood. Warmth. Pus or a bad smell.   General instructions For 24 hours after your procedure: Do not drink  alcohol. Do not make legal decisions. If you were given a sedative during the procedure, it can affect you for several hours. Do not drive or operate machinery until your health care provider says that it is safe. Rest for the remainder of the day. Return to your normal activities as told by your health care provider. Ask your health care provider what activities are safe for you. Keep all follow-up visits as told by your health care provider. This is important. Contact a health care provider if: Your pain gets worse after it had improved, and it is not relieved with pain medicines. You have any questions about caring for your drainage catheter or collection bag. The skin around your catheter insertion site breaks down. You have any of these signs of infection around your catheter insertion site: More redness, swelling, or pain. Fluid or blood. Warmth. Pus or a bad smell. Get help right away if: You have a fever or chills. You have bile leaking around your drainage catheter. Your drainage catheter becomes blocked or clogged. Your drainage catheter comes out. Summary Clean the insertion site of your biliary drainage catheter as told by your health care provider. Keep the skin around your catheter insertion site dry. If the area gets wet, dry the skin completely. Check your catheter insertion site every day for signs of infection. Contact a health care provider if your pain gets worse after it had improved, and it is not relieved with pain medicines. Get help right away if your drainage catheter becomes blocked or clogged. This information is not intended to replace advice given to you by your health care provider. Make sure you  discuss any questions you have with your healthcare provider. Document Revised: 07/05/2019 Document Reviewed: 07/05/2019 Elsevier Patient Education  2022 [place].     For questions /concerns may call Interventional Radiology at [phone] or   Interventional Radiology clinic [phone]   You may remove your dressing and shower tomorrow afternoon  DO NOT use EMLA cream for 2 weeks after port placement as the cream will remove surgical glue on your incision.

## 2022-07-20 NOTE — Procedures (Signed)
Vascular and Interventional Radiology Procedure Note  Patient: Christian Kennedy DOB: Oct 09, 1965 Medical Record Number: [id number] Note Date/Time: 07/20/22 11:27 AM   Performing Physician: Michaelle Birks, MD Assistant(s): None  Diagnosis: Routine exchange. Bladder tumor with R hydronephrosis   Procedure:  RIGHT NEPHROSTOMY TUBE EXCHANGE RIGHT ANTEROGRADE NEPHROSTOGRAM  Anesthesia: Local Anesthetic Complications: None Estimated Blood Loss: Minimal Specimens:  None  Findings:  Successful exchange for a right-sided, 10 F nephrostomy tube into the right kidney(s).   Plan: Resume previous  care. Follow up for routine nephrostomy tube exchange in 2 month(s).   See detailed procedure note with images in PACS. The patient tolerated the procedure well without incident or complication and was returned to Recovery in stable condition.    Michaelle Birks, MD [facility]   Pager. [place]

## 2022-07-20 NOTE — H&P (Signed)
Referring Physician(s): McKenzie,P  Supervising Physician: Michaelle Birks  Patient Status:  [facility] OP  Chief Complaint: Bladder cancer, right hydronephrosis   Subjective: Patient familiar to IR service from right percutaneous nephrostomy on 05/26/2022. He is a 56 y.o. male with past medical history of depression, GERD, migraines, vitamin D deficiency, left renal stone and recently diagnosed bladder cancer, status post TURBT on 05/18/2022.  He has associated right hydronephrosis ,however the right ureteral orifice could not be identified and a stent was unable to be placed during recent cystoscopy.  He presents again today for right percutaneous nephrostomy exchange versus nephroureteral catheter placement.  His urine culture back in August grew Staphylococcus epidermidis.  He currently denies fever, headache, chest pain, dyspnea, cough, nausea, vomiting or bleeding.  He does have right lateral abdominal/flank discomfort ,occasional dysuria.  Past Medical History:  Diagnosis Date   Abscess    Right buttocks   Bladder tumor    Depression    Son passed 2014   Ear infection    Eating disorder    Frequent headaches    GERD (gastroesophageal reflux disease)    Helicobacter pylori ab+ 10/27/2017   Hepatitis    childhood, jaundice   Migraines    Urinary incontinence    Varicose vein of leg    Right leg   Vitamin D deficiency    Past Surgical History:  Procedure Laterality Date   CYSTOSCOPY W/ URETERAL STENT PLACEMENT Right 11/19/2017   Procedure: CYSTOSCOPY WITH RETROGRADE PYELOGRAM/URETERAL STENT PLACEMENT;  Surgeon: Ceasar Mons, MD;  Location: [facility];  Service: Urology;  Laterality: Right;   CYSTOSCOPY WITH FULGERATION N/A 11/19/2017   Procedure: [place] AND CLOT EVACUATION;  Surgeon: Nickie Retort, MD;  Location: [facility];  Service: Urology;  Laterality: N/A;   IR NEPHROSTOMY PLACEMENT RIGHT  05/26/2022   TRANSURETHRAL RESECTION  OF BLADDER TUMOR N/A 11/19/2017   Procedure: TRANSURETHRAL RESECTION OF BLADDER TUMOR (TURBT);  Surgeon: Ceasar Mons, MD;  [facility];  Service: Urology;  Laterality: N/A;   TRANSURETHRAL RESECTION OF BLADDER TUMOR N/A 05/18/2022   Procedure: TRANSURETHRAL RESECTION OF BLADDER TUMOR (TURBT);  Surgeon: Cleon Gustin, MD;  Location: [facility];  Service: Urology;  Laterality: N/A;      Allergies: Macrobid [nitrofurantoin]  Medications: Prior to Admission medications   Medication Sig Start Date End Date Taking? Authorizing Provider  b complex vitamins capsule Take 1 capsule by mouth daily.   Yes [provider]  BLACK CURRANT SEED OIL PO Take 1 capsule by mouth daily.   Yes [provider]  Cyanocobalamin (B-12 PO) Take 1 tablet by mouth daily.   Yes [provider]  sodium chloride flush (NS) 0.9 % SOLN 10 ml syr use as directed 07/07/22  Yes Plotnikov, Evie Lacks, MD  Vitamin D, Ergocalciferol, (DRISDOL) 1.25 MG (50000 UNIT) CAPS capsule Take 1 capsule (50,000 Units total) by mouth every 7 (seven) days. 07/07/22  Yes Plotnikov, Evie Lacks, MD  zolpidem (AMBIEN) 10 MG tablet Take 0.5-1 tablets (5-10 mg total) by mouth at bedtime as needed for sleep. 07/07/22  Yes Plotnikov, Evie Lacks, MD  Aspirin-Acetaminophen-Caffeine (GOODY HEADACHE PO) Take 1 Package by mouth daily as needed (headache).    [provider]  RABEprazole (ACIPHEX) 20 MG tablet Take 1 tablet by mouth once daily Patient taking differently: PRN 11/10/21   Plotnikov, Evie Lacks, MD     Vital Signs: BP 122/80 (BP Location: Right Arm)  Pulse 90   Temp 98 F (36.7 C) (Oral)   Resp 16   SpO2 100%   Physical Exam awake, alert.  Chest clear to auscultation bilaterally.  Heart with regular rate and rhythm.  Abdomen soft, positive bowel sounds, intact right nephrostomy draining light yellow urine, insertion site mildly tender to palpation.  No lower  extremity edema.  Imaging: No results found.  Labs:  CBC: Recent Labs    03/18/22 0934 04/17/22 1446 05/26/22 0831 07/07/22 0917  WBC 7.6 12.4* 13.1* 10.8*  HGB 15.4 14.2 15.5 14.2  HCT 45.2 42.1 46.7 41.2  PLT 193.0 207 206 240.0    COAGS: Recent Labs    05/26/22 0831  INR 1.1    BMP: Recent Labs    04/17/22 1446 05/22/22 0000 05/26/22 0831 07/07/22 0917  NA 135 138 138 138  K 3.9 4.8 3.8 4.6  CL 106 100 106 102  CO2 21* '21 25 30  '$ GLUCOSE 98 98 109* 100*  BUN '16 17 19 15  '$ CALCIUM 9.1 10.2 9.4 9.9  CREATININE 1.22 0.98 1.17 0.99  GFRNONAA >60  --  >60  --     LIVER FUNCTION TESTS: Recent Labs    10/08/21 0835 03/18/22 0934 04/17/22 1446 07/07/22 0917  BILITOT 0.8 0.6 0.7 0.5  AST 14 11 12* 12  ALT '17 11 14 13  '$ ALKPHOS 58 61 57 50  PROT 7.4 7.5 7.2 7.1  ALBUMIN 4.9 4.4 4.3 4.2    Assessment and Plan: Patient familiar to IR service from right percutaneous nephrostomy on 05/26/2022. He is a 56 y.o. male with past medical history of depression, GERD, migraines, vitamin D deficiency, left renal stone and recently diagnosed bladder cancer, status post TURBT on 05/18/2022.  He has associated right hydronephrosis ,however the right ureteral orifice could not be identified and a stent was unable to be placed during recent cystoscopy.  He presents again today for right percutaneous nephrostomy exchange versus nephroureteral catheter placement.His urine culture back in August grew Staphylococcus epidermidis.  Details/risks of procedure, including but not limited to, internal bleeding, infection, injury to adjacent structures discussed with patient with his understanding and consent.    Electronically Signed: D. Rowe Robert, PA-C 07/20/2022, 10:17 AM   I spent a total of  20 minutes at the the patient's bedside AND on the patient's hospital floor or unit, greater than 50% of which was counseling/coordinating care for right percutaneous nephrostomy exchange  versus nephroureteral catheter placement

## 2022-07-21 DIAGNOSIS — N130 Hydronephrosis with ureteropelvic junction obstruction: Secondary | ICD-10-CM | POA: Diagnosis not present

## 2022-07-21 DIAGNOSIS — C678 Malignant neoplasm of overlapping sites of bladder: Secondary | ICD-10-CM | POA: Diagnosis not present

## 2022-08-11 ENCOUNTER — Encounter (HOSPITAL_COMMUNITY): Payer: Self-pay | Admitting: Emergency Medicine

## 2022-08-11 ENCOUNTER — Other Ambulatory Visit: Payer: Self-pay

## 2022-08-11 ENCOUNTER — Emergency Department (HOSPITAL_COMMUNITY): Payer: 59

## 2022-08-11 ENCOUNTER — Inpatient Hospital Stay (HOSPITAL_COMMUNITY)
Admission: EM | Admit: 2022-08-11 | Discharge: 2022-08-14 | DRG: 690 | Disposition: A | Discharge status: Home / Self Care | Payer: 59 | Attending: Family Medicine | Admitting: Family Medicine

## 2022-08-11 DIAGNOSIS — A419 Sepsis, unspecified organism: Secondary | ICD-10-CM | POA: Diagnosis not present

## 2022-08-11 DIAGNOSIS — Z20822 Contact with and (suspected) exposure to covid-19: Secondary | ICD-10-CM | POA: Diagnosis present

## 2022-08-11 DIAGNOSIS — N151 Renal and perinephric abscess: Secondary | ICD-10-CM | POA: Diagnosis not present

## 2022-08-11 DIAGNOSIS — G4700 Insomnia, unspecified: Secondary | ICD-10-CM | POA: Insufficient documentation

## 2022-08-11 DIAGNOSIS — F17210 Nicotine dependence, cigarettes, uncomplicated: Secondary | ICD-10-CM | POA: Diagnosis present

## 2022-08-11 DIAGNOSIS — R509 Fever, unspecified: Secondary | ICD-10-CM | POA: Diagnosis not present

## 2022-08-11 DIAGNOSIS — K219 Gastro-esophageal reflux disease without esophagitis: Secondary | ICD-10-CM | POA: Diagnosis present

## 2022-08-11 DIAGNOSIS — Z881 Allergy status to other antibiotic agents status: Secondary | ICD-10-CM

## 2022-08-11 DIAGNOSIS — Z8659 Personal history of other mental and behavioral disorders: Secondary | ICD-10-CM

## 2022-08-11 DIAGNOSIS — N390 Urinary tract infection, site not specified: Secondary | ICD-10-CM | POA: Insufficient documentation

## 2022-08-11 DIAGNOSIS — R109 Unspecified abdominal pain: Secondary | ICD-10-CM | POA: Diagnosis not present

## 2022-08-11 DIAGNOSIS — E559 Vitamin D deficiency, unspecified: Secondary | ICD-10-CM | POA: Diagnosis present

## 2022-08-11 DIAGNOSIS — R079 Chest pain, unspecified: Secondary | ICD-10-CM | POA: Diagnosis not present

## 2022-08-11 DIAGNOSIS — Z79899 Other long term (current) drug therapy: Secondary | ICD-10-CM

## 2022-08-11 DIAGNOSIS — C679 Malignant neoplasm of bladder, unspecified: Secondary | ICD-10-CM | POA: Diagnosis present

## 2022-08-11 DIAGNOSIS — Z936 Other artificial openings of urinary tract status: Secondary | ICD-10-CM

## 2022-08-11 DIAGNOSIS — R000 Tachycardia, unspecified: Secondary | ICD-10-CM | POA: Diagnosis not present

## 2022-08-11 DIAGNOSIS — F32A Depression, unspecified: Secondary | ICD-10-CM | POA: Diagnosis present

## 2022-08-11 DIAGNOSIS — R6510 Systemic inflammatory response syndrome (SIRS) of non-infectious origin without acute organ dysfunction: Secondary | ICD-10-CM | POA: Insufficient documentation

## 2022-08-11 DIAGNOSIS — F17200 Nicotine dependence, unspecified, uncomplicated: Secondary | ICD-10-CM | POA: Diagnosis present

## 2022-08-11 DIAGNOSIS — Z818 Family history of other mental and behavioral disorders: Secondary | ICD-10-CM

## 2022-08-11 LAB — URINALYSIS, ROUTINE W REFLEX MICROSCOPIC
Bilirubin Urine: NEGATIVE
Glucose, UA: NEGATIVE mg/dL
Ketones, ur: 20 mg/dL — AB
Nitrite: POSITIVE — AB
Protein, ur: 30 mg/dL — AB
Specific Gravity, Urine: 1.011 (ref 1.005–1.030)
WBC, UA: >50 WBC/hpf — ABNORMAL HIGH (ref 0–5)
pH: 6 (ref 5.0–8.0)

## 2022-08-11 LAB — CBC WITH DIFFERENTIAL/PLATELET
Abs Immature Granulocytes: 0.03 10*3/uL (ref 0.00–0.07)
Basophils Absolute: 0 10*3/uL (ref 0.0–0.1)
Basophils Relative: 0 %
Eosinophils Absolute: 0 10*3/uL (ref 0.0–0.5)
Eosinophils Relative: 0 %
HCT: 42.3 % (ref 39.0–52.0)
Hemoglobin: 14.4 g/dL (ref 13.0–17.0)
Immature Granulocytes: 0 %
Lymphocytes Relative: 12 %
Lymphs Abs: 1.4 10*3/uL (ref 0.7–4.0)
MCH: 30.4 pg (ref 26.0–34.0)
MCHC: 34 g/dL (ref 30.0–36.0)
MCV: 89.4 fL (ref 80.0–100.0)
Monocytes Absolute: 0.9 10*3/uL (ref 0.1–1.0)
Monocytes Relative: 8 %
Neutro Abs: 9.8 10*3/uL — ABNORMAL HIGH (ref 1.7–7.7)
Neutrophils Relative %: 80 %
Platelets: 197 10*3/uL (ref 150–400)
RBC: 4.73 MIL/uL (ref 4.22–5.81)
RDW: 13.9 % (ref 11.5–15.5)
WBC: 12.2 10*3/uL — ABNORMAL HIGH (ref 4.0–10.5)
nRBC: 0 % (ref 0.0–0.2)

## 2022-08-11 LAB — COMPREHENSIVE METABOLIC PANEL
ALT: 12 U/L (ref 0–44)
AST: 11 U/L — ABNORMAL LOW (ref 15–41)
Albumin: 4.2 g/dL (ref 3.5–5.0)
Alkaline Phosphatase: 54 U/L (ref 38–126)
Anion gap: 11 (ref 5–15)
BUN: 16 mg/dL (ref 6–20)
CO2: 21 mmol/L — ABNORMAL LOW (ref 22–32)
Calcium: 8.9 mg/dL (ref 8.9–10.3)
Chloride: 102 mmol/L (ref 98–111)
Creatinine, Ser: 1.08 mg/dL (ref 0.61–1.24)
GFR, Estimated: >60 mL/min (ref >60)
Glucose, Bld: 104 mg/dL — ABNORMAL HIGH (ref 70–99)
Potassium: 3.7 mmol/L (ref 3.5–5.1)
Sodium: 134 mmol/L — ABNORMAL LOW (ref 135–145)
Total Bilirubin: 1.4 mg/dL — ABNORMAL HIGH (ref 0.3–1.2)
Total Protein: 7.4 g/dL (ref 6.5–8.1)

## 2022-08-11 LAB — LACTIC ACID, PLASMA
Lactic Acid, Venous: 0.8 mmol/L (ref 0.5–1.9)
Lactic Acid, Venous: 0.9 mmol/L (ref 0.5–1.9)

## 2022-08-11 LAB — RESP PANEL BY RT-PCR (FLU A&B, COVID) ARPGX2
Influenza A by PCR: NEGATIVE
Influenza B by PCR: NEGATIVE
SARS Coronavirus 2 by RT PCR: NEGATIVE

## 2022-08-11 MED ORDER — SODIUM CHLORIDE 0.9 % IV SOLN
1.0000 g | Freq: Once | INTRAVENOUS | Status: AC
  Administered 2022-08-11: 1 g via INTRAVENOUS
  Filled 2022-08-11: qty 10

## 2022-08-11 MED ORDER — LACTATED RINGERS IV BOLUS
1000.0000 mL | Freq: Once | INTRAVENOUS | Status: AC
  Administered 2022-08-11: 1000 mL via INTRAVENOUS

## 2022-08-11 MED ORDER — PIPERACILLIN-TAZOBACTAM 3.375 G IVPB
3.3750 g | Freq: Three times a day (TID) | INTRAVENOUS | Status: DC
  Administered 2022-08-12 – 2022-08-14 (×8): 3.375 g via INTRAVENOUS
  Filled 2022-08-11 (×8): qty 50

## 2022-08-11 MED ORDER — IOHEXOL 300 MG/ML  SOLN
100.0000 mL | Freq: Once | INTRAMUSCULAR | Status: AC | PRN
  Administered 2022-08-11: 100 mL via INTRAVENOUS

## 2022-08-11 MED ORDER — PIPERACILLIN-TAZOBACTAM 3.375 G IVPB 30 MIN
3.3750 g | Freq: Once | INTRAVENOUS | Status: AC
  Administered 2022-08-12: 3.375 g via INTRAVENOUS
  Filled 2022-08-11: qty 50

## 2022-08-11 NOTE — Sepsis Progress Note (Signed)
Following for sepsis monitoring ?

## 2022-08-11 NOTE — Progress Notes (Signed)
Pharmacy Antibiotic Note  Christian Kennedy is a 56 y.o. male admitted on 08/11/2022 with  possible intra-abdominal infection .  Pharmacy has been consulted for Zosyn dosing.  Plan: Zosyn 3.375g IV q8h (4 hour infusion).  Weight: 99.6 kg (219 lb 9.3 oz)  Temp (24hrs), Avg:99.5 F (37.5 C), Min:99.4 F (37.4 C), Max:99.5 F (37.5 C)  Recent Labs  Lab 08/11/22 1909 08/11/22 2035  WBC 12.2*  --   CREATININE 1.08  --   LATICACIDVEN 0.8 0.9    Estimated Creatinine Clearance: 90.2 mL/min (by C-G formula based on SCr of 1.08 mg/dL).    Allergies  Allergen Reactions   Macrobid [Nitrofurantoin]     ?epig abd pain    Thank you for allowing pharmacy to be a part of this patient's care.  Wynona Neat, PharmD, BCPS  08/11/2022 11:47 PM

## 2022-08-11 NOTE — ED Provider Notes (Signed)
[facility] EMERGENCY DEPARTMENT Provider Note   CSN: [id number] Arrival date & time: 08/11/22  1738     History {Add pertinent medical, surgical, social history, OB history to HPI:1} Chief Complaint  Patient presents with   Abdominal Pain   Fever    Christian Kennedy is a 56 y.o. male.  Patient as above with significant medical history as below, including childhood hepatitis, bladder cancer, right-sided nephrostomy, bladder tumor resection Dr. Lovena Neighbours 2/19 and again 8/23 with Dr. Alyson Ingles who presents to the ED with complaint of right lower quadrant abdominal pain, right flank pain, fever, elevated heart rate.  Patient reports symptom onset approximately the last 24 hours.  Tmax at home 102.9.  Reports that he took a medication that he had from [place] which did help his discomfort feels somewhat better.  He feels his right-sided nephrostomy is draining appropriately, the urine is somewhat cloudier than normal but no blood or pus reported.  Discomfort is primarily just anterior to the nephrostomy tube insertion site.  No discomfort at the actual nephrostomy tube insertion site, no skin changes surrounding surgical dressing.  No nausea or vomiting, no change to bowel or bladder function.  Elevated at home, 120s 130s, no chest pain or palpitations, no dyspnea.  No recent travel or sick contacts, no leg swelling, no history of DVT or PE  Patient is concerned that he ate too much for lunch and thinks this may be what is provoking his abdominal discomfort     Past Medical History:  Diagnosis Date   Abscess    Right buttocks   Bladder tumor    Depression    Son passed 2014   Ear infection    Eating disorder    Frequent headaches    GERD (gastroesophageal reflux disease)    Helicobacter pylori ab+ 10/27/2017   Hepatitis    childhood, jaundice   Migraines    Urinary incontinence    Varicose vein of leg    Right leg   Vitamin D deficiency     Past Surgical History:  Procedure  Laterality Date   CYSTOSCOPY W/ URETERAL STENT PLACEMENT Right 11/19/2017   Procedure: CYSTOSCOPY WITH RETROGRADE PYELOGRAM/URETERAL STENT PLACEMENT;  Surgeon: Ceasar Mons, MD;  Location: [facility];  Service: Urology;  Laterality: Right;   CYSTOSCOPY WITH FULGERATION N/A 11/19/2017   Procedure: [place] AND CLOT EVACUATION;  Surgeon: Nickie Retort, MD;  Location: [facility];  Service: Urology;  Laterality: N/A;   IR NEPHROSTOMY EXCHANGE RIGHT  07/20/2022   IR NEPHROSTOMY PLACEMENT RIGHT  05/26/2022   TRANSURETHRAL RESECTION OF BLADDER TUMOR N/A 11/19/2017   Procedure: TRANSURETHRAL RESECTION OF BLADDER TUMOR (TURBT);  Surgeon: Ceasar Mons, MD;  Location: [facility];  Service: Urology;  Laterality: N/A;   TRANSURETHRAL RESECTION OF BLADDER TUMOR N/A 05/18/2022   Procedure: TRANSURETHRAL RESECTION OF BLADDER TUMOR (TURBT);  Surgeon: Cleon Gustin, MD;  Location: [facility];  Service: Urology;  Laterality: N/A;     The history is provided by the patient. No language interpreter was used.  Abdominal Pain Associated symptoms: fever   Associated symptoms: no chest pain, no chills, no cough, no hematuria, no nausea, no shortness of breath and no vomiting   Fever Associated symptoms: no chest pain, no chills, no confusion, no cough, no headaches, no nausea, no rash and no vomiting        Home Medications Prior to Admission medications   Medication Sig Start Date End Date  Taking? Authorizing Provider  Aspirin-Acetaminophen-Caffeine (GOODY HEADACHE PO) Take 1 Package by mouth daily as needed (headache).    [provider]  b complex vitamins capsule Take 1 capsule by mouth daily.    [provider]  BLACK CURRANT SEED OIL PO Take 1 capsule by mouth daily.    [provider]  Cyanocobalamin (B-12 PO) Take 1 tablet by mouth daily.    [provider]  RABEprazole (ACIPHEX) 20 MG tablet  Take 1 tablet by mouth once daily Patient taking differently: PRN 11/10/21   Plotnikov, Evie Lacks, MD  sodium chloride flush (NS) 0.9 % SOLN 10 ml syr use as directed 07/07/22   Plotnikov, Evie Lacks, MD  Vitamin D, Ergocalciferol, (DRISDOL) 1.25 MG (50000 UNIT) CAPS capsule Take 1 capsule (50,000 Units total) by mouth every 7 (seven) days. 07/07/22   Plotnikov, Evie Lacks, MD  zolpidem (AMBIEN) 10 MG tablet Take 0.5-1 tablets (5-10 mg total) by mouth at bedtime as needed for sleep. 07/07/22   Plotnikov, Evie Lacks, MD      Allergies    Macrobid [nitrofurantoin]    Review of Systems   Review of Systems  Constitutional:  Positive for fever. Negative for chills.  HENT:  Negative for facial swelling and trouble swallowing.   Eyes:  Negative for photophobia and visual disturbance.  Respiratory:  Negative for cough and shortness of breath.   Cardiovascular:  Negative for chest pain and palpitations.  Gastrointestinal:  Positive for abdominal pain. Negative for nausea and vomiting.  Endocrine: Negative for polydipsia and polyuria.  Genitourinary:  Negative for difficulty urinating and hematuria.  Musculoskeletal:  Negative for gait problem and joint swelling.  Skin:  Negative for pallor and rash.  Neurological:  Negative for syncope and headaches.  Psychiatric/Behavioral:  Negative for agitation and confusion.     Physical Exam Updated Vital Signs BP 123/84   Pulse (!) 102   Temp 99.4 F (37.4 C) (Oral)   Resp (!) 23   Wt 99.6 kg   SpO2 96%   BMI 27.45 kg/m  Physical Exam Vitals and nursing note reviewed.  Constitutional:      General: He is not in acute distress.    Appearance: Normal appearance. He is well-developed. He is not ill-appearing, toxic-appearing or diaphoretic.  HENT:     Head: Normocephalic and atraumatic.     Right Ear: External ear normal.     Left Ear: External ear normal.     Mouth/Throat:     Mouth: Mucous membranes are moist.  Eyes:     General: No scleral  icterus. Cardiovascular:     Rate and Rhythm: Regular rhythm. Tachycardia present.     Pulses: Normal pulses.     Heart sounds: Normal heart sounds.  Pulmonary:     Effort: Pulmonary effort is normal. No respiratory distress.     Breath sounds: Normal breath sounds. No wheezing.  Abdominal:     General: Abdomen is flat.     Palpations: Abdomen is soft.     Tenderness: There is abdominal tenderness. There is no guarding or rebound.    Musculoskeletal:        General: Normal range of motion.     Cervical back: Normal range of motion.     Right lower leg: No edema.     Left lower leg: No edema.  Skin:    General: Skin is warm and dry.     Capillary Refill: Capillary refill takes less than 2 seconds.  Neurological:  Mental Status: He is alert and oriented to person, place, and time.     GCS: GCS eye subscore is 4. GCS verbal subscore is 5. GCS motor subscore is 6.  Psychiatric:        Mood and Affect: Mood normal.        Behavior: Behavior normal.     ED Results / Procedures / Treatments   Labs (all labs ordered are listed, but only abnormal results are displayed) Labs Reviewed  COMPREHENSIVE METABOLIC PANEL - Abnormal; Notable for the following components:      Result Value   Sodium 134 (*)    CO2 21 (*)    Glucose, Bld 104 (*)    AST 11 (*)    Total Bilirubin 1.4 (*)    All other components within normal limits  CBC WITH DIFFERENTIAL/PLATELET - Abnormal; Notable for the following components:   WBC 12.2 (*)    Neutro Abs 9.8 (*)    All other components within normal limits  URINALYSIS, ROUTINE W REFLEX MICROSCOPIC - Abnormal; Notable for the following components:   APPearance CLOUDY (*)    Hgb urine dipstick MODERATE (*)    Ketones, ur 20 (*)    Protein, ur 30 (*)    Nitrite POSITIVE (*)    Leukocytes,Ua LARGE (*)    WBC, UA >50 (*)    Bacteria, UA RARE (*)    All other components within normal limits  RESP PANEL BY RT-PCR (FLU A&B, COVID) ARPGX2  CULTURE,  BLOOD (ROUTINE X 2)  CULTURE, BLOOD (ROUTINE X 2)  URINE CULTURE  LACTIC ACID, PLASMA  LACTIC ACID, PLASMA    EKG EKG Interpretation  Date/Time:  Tuesday August 11 2022 19:06:27 EST Ventricular Rate:  127 PR Interval:  142 QRS Duration: 76 QT Interval:  306 QTC Calculation: 444 R Axis:   56 Text Interpretation: Sinus tachycardia Nonspecific T wave abnormality Abnormal ECG No previous ECGs available Confirmed by Wynona Dove ([id number]) on 08/11/2022 7:18:41 PM  Radiology CT ABDOMEN PELVIS W CONTRAST  Result Date: 08/11/2022 CLINICAL DATA:  Right abdominal/flank pain, fever. Bladder cancer, status post right nephrostomy. EXAM: CT ABDOMEN AND PELVIS WITH CONTRAST TECHNIQUE: Multidetector CT imaging of the abdomen and pelvis was performed using the standard protocol following bolus administration of intravenous contrast. RADIATION DOSE REDUCTION: This exam was performed according to the departmental dose-optimization program which includes automated exposure control, adjustment of the mA and/or kV according to patient size and/or use of iterative reconstruction technique. CONTRAST:  160m OMNIPAQUE IOHEXOL 300 MG/ML  SOLN COMPARISON:  06/15/2022 FINDINGS: Lower chest: Mild left basilar atelectasis. Hepatobiliary: Liver is within normal limits. Layering gallbladder sludge (series 2/image 37), without associated inflammatory changes. No intrahepatic or extrahepatic ductal dilatation. Pancreas: Within normal limits. Spleen: Within normal limits. Adrenals/Urinary Tract: Adrenal glands are within normal limits. 2.3 cm vague fluid density lesion with overlying inflammatory changes in the left upper kidney (series 2/image 32), previously measuring 10 mm, likely reflecting a renal abscess given the rapid progression. Right kidney is notable for a percutaneous nephrostomy in satisfactory position. No hydronephrosis. Mild wall thickening/urothelial enhancement involving the distal right ureter (series 2/image  78) with asymmetric wall thickening involving the right posterolateral bladder (series 2/image 81), possibly related to the patient's known urothelial carcinoma. Stomach/Bowel: Stomach is within normal limits. No evidence of bowel obstruction. Normal appendix (series 2/image 89). No colonic wall thickening or inflammatory changes. Vascular/Lymphatic: No evidence of abdominal aortic aneurysm. Atherosclerotic calcifications of the abdominal aorta and branch vessels.  No suspicious abdominopelvic lymphadenopathy. Reproductive: Mild prostatomegaly. Other: No abdominopelvic ascites. Musculoskeletal: Degenerative changes of the visualized thoracolumbar spine. IMPRESSION: Suspected 2.3 cm left upper pole renal abscess, progressive from prior CT, with associated perinephric stranding. Asymmetric wall thickening involving the right posterolateral bladder and distal right ureter, possibly related to the patient's known urothelial carcinoma. Right percutaneous nephrostomy in satisfactory position. No hydronephrosis. Additional ancillary findings as above. Electronically Signed   By: Julian Hy M.D.   On: 08/11/2022 22:33   DG Chest 2 View  Result Date: 08/11/2022 CLINICAL DATA:  Right flank pain and fever since last night. EXAM: CHEST - 2 VIEW COMPARISON:  Chest, abdomen and pelvis CT dated 06/15/2022 FINDINGS: Normal sized heart. Clear lungs. Possible small amount of posterior pleural fluid on the lateral view. Mild-to-moderate peribronchial thickening. Thoracic spine degenerative changes. IMPRESSION: 1. Possible small amount of posterior pleural fluid on the lateral view. 2. Mild to moderate bronchitic changes. Electronically Signed   By: Claudie Revering M.D.   On: 08/11/2022 19:25    Procedures Procedures  {Document cardiac monitor, telemetry assessment procedure when appropriate:1}  Medications Ordered in ED Medications  lactated ringers bolus 1,000 mL (has no administration in time range)  lactated  ringers bolus 1,000 mL (0 mLs Intravenous Stopped 08/11/22 2243)  cefTRIAXone (ROCEPHIN) 1 g in sodium chloride 0.9 % 100 mL IVPB (1 g Intravenous New Bag/Given 08/11/22 2243)  iohexol (OMNIPAQUE) 300 MG/ML solution 100 mL (100 mLs Intravenous Contrast Given 08/11/22 2147)    ED Course/ Medical Decision Making/ A&P Clinical Course as of 08/11/22 2323  Tue Aug 11, 2022  2320 Spoke w/ Dr Alyson Ingles, recommend start zosyn [SG]    Clinical Course User Index [SG] Jeanell Sparrow, DO                           Medical Decision Making Amount and/or Complexity of Data Reviewed Labs: ordered. Radiology: ordered.  Risk Prescription drug management.   This patient presents to the ED with chief complaint(s) of abdominal pain, fever, elevated heart rate with pertinent past medical history of bladder tumor, nephrostomy which further complicates the presenting complaint. The complaint involves an extensive differential diagnosis and also carries with it a high risk of complications and morbidity.    The differential diagnosis includes but not limited to Differential diagnosis includes but is not exclusive to acute appendicitis, renal colic, testicular torsion, urinary tract infection, prostatitis,  diverticulitis, small bowel obstruction, colitis, abdominal aortic aneurysm, gastroenteritis, constipation etc.  Differential diagnosis includes but is not exclusive to acute cholecystitis, intrathoracic causes for epigastric abdominal pain, gastritis, duodenitis, pancreatitis, small bowel or large bowel obstruction, abdominal aortic aneurysm, hernia, gastritis, etc.  . Serious etiologies were considered.   The initial plan is to screening labs, CT, IV fluids   Additional history obtained: Additional history obtained from family Records reviewed Primary Care Documents and prior ED visits, prior labs and imaging Saw Dr. Alyson Ingles 9/27, right-sided nephrostomy tube exchange 10/23 Dr. Maryelizabeth Kaufmann with IR Sees  Dr. Delton Coombes, no chemo at this time  Independent labs interpretation:  The following labs were independently interpreted:  Urinalysis concerning for infection, send urine culture, start antibiotics WBC elevated 12.2 with left shift Cr stable, LA is not elevated RVP neg  Independent visualization of imaging: - I independently visualized the following imaging with scope of interpretation limited to determining acute life threatening conditions related to emergency care: CTAP, which revealed right nephrostomy appears to be functioning,  he has left sided likely renal abscess  Cardiac monitoring was reviewed and interpreted by myself which shows sinus tachy  Treatment and Reassessment: IVF Rocephin >> Hr improved  Rpt IVF  Switch to zosyn >> hr continues to improve  Consultation: - Consulted or discussed management/test interpretation w/ external professional: Dr Alyson Ingles urology  Consideration for admission or further workup: Admission was considered    Pt with sepsis 2/2 likely renal abscess, not severe sepsis, not shock. Zosyn started, cultures sent, urology consulted. Recommend hospitalist admit. Will likely require IR drainage of abscess per Dr Alyson Ingles.     Social Determinants of health: Social History   Tobacco Use   Smoking status: Every Day    Packs/day: 1.00    Years: 30.00    Total pack years: 30.00    Types: Cigarettes   Smokeless tobacco: Never  Vaping Use   Vaping Use: Never used  Substance Use Topics   Alcohol use: Yes    Comment: rare   Drug use: No      {Document critical care time when appropriate:1} {Document review of labs and clinical decision tools ie heart score, Chads2Vasc2 etc:1}  {Document your independent review of radiology images, and any outside records:1} {Document your discussion with family members, caretakers, and with consultants:1} {Document social determinants of health affecting pt's care:1} {Document your decision making why  or why not admission, treatments were needed:1} Final Clinical Impression(s) / ED Diagnoses Final diagnoses:  Renal abscess    Rx / DC Orders ED Discharge Orders     None

## 2022-08-11 NOTE — ED Triage Notes (Signed)
Pt in from home with R abdominal/flank pain and fever since last night. Temp 99.4 in triage. Pt has hx of bladder CA with R nephrostomy placed x 3 mos. Reports no blockages, good output in nephrostomy.

## 2022-08-12 DIAGNOSIS — E559 Vitamin D deficiency, unspecified: Secondary | ICD-10-CM | POA: Diagnosis not present

## 2022-08-12 DIAGNOSIS — K219 Gastro-esophageal reflux disease without esophagitis: Secondary | ICD-10-CM | POA: Diagnosis not present

## 2022-08-12 DIAGNOSIS — C679 Malignant neoplasm of bladder, unspecified: Secondary | ICD-10-CM | POA: Diagnosis not present

## 2022-08-12 DIAGNOSIS — Z818 Family history of other mental and behavioral disorders: Secondary | ICD-10-CM | POA: Diagnosis not present

## 2022-08-12 DIAGNOSIS — G4700 Insomnia, unspecified: Secondary | ICD-10-CM | POA: Diagnosis not present

## 2022-08-12 DIAGNOSIS — N151 Renal and perinephric abscess: Secondary | ICD-10-CM

## 2022-08-12 DIAGNOSIS — F32A Depression, unspecified: Secondary | ICD-10-CM | POA: Diagnosis not present

## 2022-08-12 DIAGNOSIS — N3000 Acute cystitis without hematuria: Secondary | ICD-10-CM | POA: Diagnosis not present

## 2022-08-12 DIAGNOSIS — Z936 Other artificial openings of urinary tract status: Secondary | ICD-10-CM | POA: Diagnosis not present

## 2022-08-12 DIAGNOSIS — Z20822 Contact with and (suspected) exposure to covid-19: Secondary | ICD-10-CM | POA: Diagnosis not present

## 2022-08-12 DIAGNOSIS — R6510 Systemic inflammatory response syndrome (SIRS) of non-infectious origin without acute organ dysfunction: Secondary | ICD-10-CM | POA: Insufficient documentation

## 2022-08-12 DIAGNOSIS — Z79899 Other long term (current) drug therapy: Secondary | ICD-10-CM | POA: Diagnosis not present

## 2022-08-12 DIAGNOSIS — F17200 Nicotine dependence, unspecified, uncomplicated: Secondary | ICD-10-CM | POA: Diagnosis not present

## 2022-08-12 DIAGNOSIS — F5101 Primary insomnia: Secondary | ICD-10-CM

## 2022-08-12 DIAGNOSIS — F17210 Nicotine dependence, cigarettes, uncomplicated: Secondary | ICD-10-CM | POA: Diagnosis not present

## 2022-08-12 DIAGNOSIS — Z8659 Personal history of other mental and behavioral disorders: Secondary | ICD-10-CM | POA: Diagnosis not present

## 2022-08-12 DIAGNOSIS — Z881 Allergy status to other antibiotic agents status: Secondary | ICD-10-CM | POA: Diagnosis not present

## 2022-08-12 DIAGNOSIS — N390 Urinary tract infection, site not specified: Secondary | ICD-10-CM | POA: Insufficient documentation

## 2022-08-12 DIAGNOSIS — R69 Illness, unspecified: Secondary | ICD-10-CM | POA: Diagnosis not present

## 2022-08-12 LAB — COMPREHENSIVE METABOLIC PANEL
ALT: 13 U/L (ref 0–44)
AST: 11 U/L — ABNORMAL LOW (ref 15–41)
Albumin: 3.5 g/dL (ref 3.5–5.0)
Alkaline Phosphatase: 45 U/L (ref 38–126)
Anion gap: 8 (ref 5–15)
BUN: 15 mg/dL (ref 6–20)
CO2: 24 mmol/L (ref 22–32)
Calcium: 8.6 mg/dL — ABNORMAL LOW (ref 8.9–10.3)
Chloride: 104 mmol/L (ref 98–111)
Creatinine, Ser: 1.05 mg/dL (ref 0.61–1.24)
GFR, Estimated: >60 mL/min (ref >60)
Glucose, Bld: 95 mg/dL (ref 70–99)
Potassium: 3.9 mmol/L (ref 3.5–5.1)
Sodium: 136 mmol/L (ref 135–145)
Total Bilirubin: 1.1 mg/dL (ref 0.3–1.2)
Total Protein: 6.3 g/dL — ABNORMAL LOW (ref 6.5–8.1)

## 2022-08-12 LAB — CBC WITH DIFFERENTIAL/PLATELET
Abs Immature Granulocytes: 0.03 10*3/uL (ref 0.00–0.07)
Basophils Absolute: 0 10*3/uL (ref 0.0–0.1)
Basophils Relative: 0 %
Eosinophils Absolute: 0.1 10*3/uL (ref 0.0–0.5)
Eosinophils Relative: 1 %
HCT: 37.9 % — ABNORMAL LOW (ref 39.0–52.0)
Hemoglobin: 12.8 g/dL — ABNORMAL LOW (ref 13.0–17.0)
Immature Granulocytes: 0 %
Lymphocytes Relative: 23 %
Lymphs Abs: 2 10*3/uL (ref 0.7–4.0)
MCH: 30.5 pg (ref 26.0–34.0)
MCHC: 33.8 g/dL (ref 30.0–36.0)
MCV: 90.2 fL (ref 80.0–100.0)
Monocytes Absolute: 1 10*3/uL (ref 0.1–1.0)
Monocytes Relative: 12 %
Neutro Abs: 5.5 10*3/uL (ref 1.7–7.7)
Neutrophils Relative %: 64 %
Platelets: 151 10*3/uL (ref 150–400)
RBC: 4.2 MIL/uL — ABNORMAL LOW (ref 4.22–5.81)
RDW: 13.9 % (ref 11.5–15.5)
WBC: 8.7 10*3/uL (ref 4.0–10.5)
nRBC: 0 % (ref 0.0–0.2)

## 2022-08-12 LAB — PROTIME-INR
INR: 1.1 (ref 0.8–1.2)
Prothrombin Time: 14.2 seconds (ref 11.4–15.2)

## 2022-08-12 LAB — HIV ANTIBODY (ROUTINE TESTING W REFLEX): HIV Screen 4th Generation wRfx: NONREACTIVE

## 2022-08-12 LAB — MAGNESIUM: Magnesium: 2 mg/dL (ref 1.7–2.4)

## 2022-08-12 MED ORDER — HEPARIN SODIUM (PORCINE) 5000 UNIT/ML IJ SOLN
5000.0000 [IU] | Freq: Three times a day (TID) | INTRAMUSCULAR | Status: DC
  Administered 2022-08-13 – 2022-08-14 (×5): 5000 [IU] via SUBCUTANEOUS
  Filled 2022-08-12 (×5): qty 1

## 2022-08-12 MED ORDER — OXYCODONE HCL 5 MG PO TABS: 5.0000 mg | ORAL_TABLET | ORAL | Status: DC | PRN

## 2022-08-12 MED ORDER — ONDANSETRON HCL 4 MG/2ML IJ SOLN: 4.0000 mg | Freq: Four times a day (QID) | INTRAMUSCULAR | Status: DC | PRN

## 2022-08-12 MED ORDER — HEPARIN SODIUM (PORCINE) 5000 UNIT/ML IJ SOLN: 5000.0000 [IU] | Freq: Three times a day (TID) | INTRAMUSCULAR | Status: DC

## 2022-08-12 MED ORDER — NICOTINE 21 MG/24HR TD PT24
21.0000 mg | MEDICATED_PATCH | Freq: Every day | TRANSDERMAL | Status: DC
  Administered 2022-08-12 – 2022-08-14 (×3): 21 mg via TRANSDERMAL
  Filled 2022-08-12 (×3): qty 1

## 2022-08-12 MED ORDER — MUSCLE RUB 10-15 % EX CREA: TOPICAL_CREAM | CUTANEOUS | Status: DC | PRN

## 2022-08-12 MED ORDER — ONDANSETRON HCL 4 MG PO TABS: 4.0000 mg | ORAL_TABLET | Freq: Four times a day (QID) | ORAL | Status: DC | PRN

## 2022-08-12 MED ORDER — MORPHINE SULFATE (PF) 2 MG/ML IV SOLN: 2.0000 mg | INTRAVENOUS | Status: DC | PRN

## 2022-08-12 MED ORDER — SODIUM CHLORIDE 0.9 % IV SOLN: INTRAVENOUS | Status: DC

## 2022-08-12 MED ORDER — ZOLPIDEM TARTRATE 5 MG PO TABS: 5.0000 mg | ORAL_TABLET | Freq: Every evening | ORAL | Status: DC | PRN

## 2022-08-12 MED ORDER — TRAMADOL HCL 50 MG PO TABS: 50.0000 mg | ORAL_TABLET | Freq: Three times a day (TID) | ORAL | Status: DC | PRN

## 2022-08-12 NOTE — Assessment & Plan Note (Signed)
Continue Ambien. 

## 2022-08-12 NOTE — Assessment & Plan Note (Signed)
-   Patient had reported fever, tachycardia 130, tachypnea 23, leukocytosis 12.2 - No endorgan damage - Continue Zosyn - Continue to monitor

## 2022-08-12 NOTE — Progress Notes (Signed)
Patient seen and evaluated, chart reviewed, please see EMR for updated orders. Please see full H&P dictated by admitting physician Dr Orlin Hilding for same date of service.    Brief Narrative:   56yo with a history of muscle invasive bladder cancer and right ureteral obstruction managed with a nephrostomy tube ( status post surgery with Mckenzie), h/o  depression, GERD, vitamin D deficiency, hepatitis admitted on 08/12/22 with concerns for UTI and left-sided renal abscess    Assessment and Plan: 1)Lt Renal Abscess--concerns for possible complicated UTI in a patient with indwelling right-sided nephrostomy tube in the setting of bladder malignancy - CT scan shows 2.3 cm left upper pole renal abscess progressive from prior CT (2.3 cm vague fluid density lesion with overlying inflammatory changes in the left upper kidney (series 2/image 32), previously measuring 10 mm, likely reflecting a renal abscess given the rapid progression.) -Urology and IR input appreciated -Not amenable to drainage -Antibiotic management advised --Treat with IV Zosyn pending urine and blood culture data    2)Urothelial malignancy/muscle invasive bladder cancer-- -patient declined chemotherapy patient declines cystectomy/surgery from Dr. Tresa Moore -Patient is advised to follow-up with urologist Dr. Tresa Moore and Dr. Delton Coombes at the oncologist to discuss further work-up and treatment options   3)Smoker --Smokes a pack per day - Continue nicotine patch   4)Insomnia - Continue Ambien  -Total care time over 57 minutes - Patient seen and evaluated, chart reviewed, please see EMR for updated orders. Please see full H&P dictated by admitting physician Dr Orlin Hilding for same date of service.   Roxan Hockey, MD

## 2022-08-12 NOTE — Assessment & Plan Note (Signed)
--  Smokes a pack per day - Continue nicotine patch

## 2022-08-12 NOTE — TOC Progression Note (Signed)
Transition of Care [facility]) - Progression Note    Patient Details  Name: Christian Kennedy MRN: [id number] Date of Birth: 13-Jul-1966  [facility]) CM/SW Contact  Salome Arnt, [place] Phone Number: 08/12/2022, 11:03 AM  Clinical Narrative:  Transition of Care (TOC) Screening Note   Patient Details  Name: Christian Kennedy Date of Birth: April 13, 1966   Transition of Care [facility]) CM/SW Contact:    Salome Arnt, [place] Phone Number: 08/12/2022, 11:03 AM    Transition of Care Department [facility]) has reviewed patient and no TOC needs have been identified at this time. We will continue to monitor patient advancement through interdisciplinary progression rounds. If new patient transition needs arise, please place a TOC consult.          Barriers to Discharge: Continued Medical Work up  Expected Discharge Plan and Services                                                 Social Determinants of Health (SDOH) Interventions    Readmission Risk Interventions     No data to display

## 2022-08-12 NOTE — Assessment & Plan Note (Addendum)
-   Complicated with right nephrostomy tube - Continue Zosyn

## 2022-08-12 NOTE — H&P (Signed)
History and Physical    Patient: Christian Kennedy [id number]:[id number] DOB: Jul 11, 1966 DOA: 08/11/2022 DOS: the patient was seen and examined on 08/12/2022 PCP: Plotnikov, Evie Lacks, MD  Patient coming from: Home  Chief Complaint:  Chief Complaint  Patient presents with   Abdominal Pain   Fever   HPI: Christian Kennedy is a 56 y.o. male with medical history significant of bladder tumor status post surgery with [place], depression, GERD, vitamin D deficiency, hepatitis, and more presents the ED with a chief complaint of bilateral flank pain.  Patient reports has had bilateral flank pain, high fever, tachycardia.  Patient reports that his Tmax was 102.4.  He did not take any medication for it.  He reports normal urine output.  He has had no nausea vomiting or diarrhea.  He does report a decreased appetite and fasting for the last 2 days.  Patient reports he likes to just fast sometimes.  Patient reports right shoulder burning is happened ever since nephrostomy tube was placed 3 months ago.  Possibly referred pain?  Muscle rub ordered.  Patient was given option of p.o. muscle relaxer, but chose to have topical rather than systemic treatment.  Patient reports that his pain is currently under control.  He has no other complaints at this time.  Patient smokes a pack per day.  He request nicotine patch at this time.  He does not drink alcohol.  He does not use illicit drugs, is not vaccinated for COVID.  Patient is full code. Review of Systems: As mentioned in the history of present illness. All other systems reviewed and are negative. Past Medical History:  Diagnosis Date   Abscess    Right buttocks   Bladder tumor    Depression    Son passed 2014   Ear infection    Eating disorder    Frequent headaches    GERD (gastroesophageal reflux disease)    Helicobacter pylori ab+ 10/27/2017   Hepatitis    childhood, jaundice   Migraines    Urinary incontinence    Varicose vein of leg    Right leg    Vitamin D deficiency    Past Surgical History:  Procedure Laterality Date   CYSTOSCOPY W/ URETERAL STENT PLACEMENT Right 11/19/2017   Procedure: CYSTOSCOPY WITH RETROGRADE PYELOGRAM/URETERAL STENT PLACEMENT;  Surgeon: Ceasar Mons, MD;  Location: [facility];  Service: Urology;  Laterality: Right;   CYSTOSCOPY WITH FULGERATION N/A 11/19/2017   Procedure: [place] AND CLOT EVACUATION;  Surgeon: Nickie Retort, MD;  Location: [facility];  Service: Urology;  Laterality: N/A;   IR NEPHROSTOMY EXCHANGE RIGHT  07/20/2022   IR NEPHROSTOMY PLACEMENT RIGHT  05/26/2022   TRANSURETHRAL RESECTION OF BLADDER TUMOR N/A 11/19/2017   Procedure: TRANSURETHRAL RESECTION OF BLADDER TUMOR (TURBT);  Surgeon: Ceasar Mons, MD;  Location: [facility];  Service: Urology;  Laterality: N/A;   TRANSURETHRAL RESECTION OF BLADDER TUMOR N/A 05/18/2022   Procedure: TRANSURETHRAL RESECTION OF BLADDER TUMOR (TURBT);  Surgeon: Cleon Gustin, MD;  Location: [facility];  Service: Urology;  Laterality: N/A;   Social History:  reports that he has been smoking cigarettes. He has a 30.00 pack-year smoking history. He has never used smokeless tobacco. He reports current alcohol use. He reports that he does not use drugs.  Allergies  Allergen Reactions   Macrobid [Nitrofurantoin]     ?epig abd pain    Family History  Problem Relation Age of Onset   Cancer Mother  Depression Mother    Arthritis Father    Depression Father    Hearing loss Father    Depression Daughter    Early death Son     Prior to Admission medications   Medication Sig Start Date End Date Taking? Authorizing Provider  Aspirin-Acetaminophen-Caffeine (GOODY HEADACHE PO) Take 1 Package by mouth daily as needed (headache).    [provider]  b complex vitamins capsule Take 1 capsule by mouth daily.    [provider]  BLACK CURRANT SEED OIL PO Take 1 capsule by  mouth daily.    [provider]  Cyanocobalamin (B-12 PO) Take 1 tablet by mouth daily.    [provider]  RABEprazole (ACIPHEX) 20 MG tablet Take 1 tablet by mouth once daily Patient taking differently: PRN 11/10/21   Plotnikov, Evie Lacks, MD  sodium chloride flush (NS) 0.9 % SOLN 10 ml syr use as directed 07/07/22   Plotnikov, Evie Lacks, MD  Vitamin D, Ergocalciferol, (DRISDOL) 1.25 MG (50000 UNIT) CAPS capsule Take 1 capsule (50,000 Units total) by mouth every 7 (seven) days. 07/07/22   Plotnikov, Evie Lacks, MD  zolpidem (AMBIEN) 10 MG tablet Take 0.5-1 tablets (5-10 mg total) by mouth at bedtime as needed for sleep. 07/07/22   Plotnikov, Evie Lacks, MD    Physical Exam: Vitals:   08/11/22 1839 08/11/22 2010 08/11/22 2130 08/11/22 2330  BP: 121/79 114/78 123/84 118/71  Pulse: (!) 130 (!) 114 (!) 102 97  Resp: (!) 22 19 (!) 23 19  Temp: 99.4 F (37.4 C)   99.5 F (37.5 C)  TempSrc: Oral   Oral  SpO2: 97% 98% 96% 97%  Weight:       1.  General: Patient lying supine in bed,  no acute distress   2. Psychiatric: Alert and oriented x 3, mood and behavior normal for situation, pleasant and cooperative with exam   3. Neurologic: Speech and language are normal, face is symmetric, moves all 4 extremities voluntarily, at baseline without acute deficits on limited exam   4. HEENMT:  Head is atraumatic, normocephalic, pupils reactive to light, neck is supple, trachea is midline, mucous membranes are moist   5. Respiratory : Lungs are clear to auscultation bilaterally without wheezing, rhonchi, rales, no cyanosis, no increase in work of breathing or accessory muscle use   6. Cardiovascular : Heart rate normal, rhythm is regular, no murmurs, rubs or gallops, no peripheral edema, peripheral pulses palpated   7. Gastrointestinal:  Abdomen is soft, nondistended, nontender to palpation bowel sounds active, no masses or organomegaly palpated  Nephrostomy tube present on the  right   8. Skin:  Skin is warm, dry and intact without rashes, acute lesions, or ulcers on limited exam   9.Musculoskeletal:  Muscle knot at left shoulder  Data Reviewed: In the ED Temp 99.4-99.5, heart rate 97-130, respiratory rate 19-23, blood pressure 114/71-123/84, satting at 97% Lactic acid 0.9 Leukocytosis 12.2, hemoglobin 14.4, platelets 197 Chemistry unremarkable T. bili slightly elevated UA is indicative of UTI Urology consulted and recommends IR evaluation which is ordered Continue Zosyn Chest x-ray shows possible small amount of pleural fluid mild to moderate bronchitic changes EKG shows heart rate of 127, sinus tach, QTc 444 Blood culture and urine culture pending CT abdomen shows 2.3 cm left upper pole renal abscess  admission requested for renal abscess  Assessment and Plan: * Renal abscess - CT scan shows 2.3 cm left upper pole renal abscess progressive from prior CT - Urology  consulted and recommends IR evaluation and starting Zosyn - Continue Zosyn - Currently n.p.o. for possible IR procedure - Hold any nephrotoxic agents when possible - Continue to monitor  UTI (urinary tract infection) - Continue Zosyn  Insomnia - Continue Ambien  SIRS (systemic inflammatory response syndrome) ([place]) - Patient had reported fever, tachycardia 130, tachypnea 23, leukocytosis 12.2 - No endorgan damage - Continue Zosyn - Continue to monitor  Smoker --Smokes a pack per day - Continue nicotine patch      Advance Care Planning:   Code Status: Full Code   Consults: Urology and IR  Family Communication: No family at bedside  Severity of Illness: The appropriate patient status for this patient is INPATIENT. Inpatient status is judged to be reasonable and necessary in order to provide the required intensity of service to ensure the patient's safety. The patient's presenting symptoms, physical exam findings, and initial radiographic and laboratory data in the context  of their chronic comorbidities is felt to place them at high risk for further clinical deterioration. Furthermore, it is not anticipated that the patient will be medically stable for discharge from the hospital within 2 midnights of admission.   * I certify that at the point of admission it is my clinical judgment that the patient will require inpatient hospital care spanning beyond 2 midnights from the point of admission due to high intensity of service, high risk for further deterioration and high frequency of surveillance required.*  Author: Rolla Plate, DO 08/12/2022 5:40 AM  For on call review [email or web address].

## 2022-08-12 NOTE — Progress Notes (Signed)
Request received for renal abscess drain. Dr. Pascal Lux reviewed and determined area of concern is not amendable to drainage, questionable renal abscess and recommends antibiotics.  Care team made aware of findings.     Narda Rutherford, AGNP-BC 08/12/2022, 11:38 AM

## 2022-08-12 NOTE — Assessment & Plan Note (Signed)
-   CT scan shows 2.3 cm left upper pole renal abscess progressive from prior CT - Urology consulted and recommends IR evaluation and starting Zosyn - Continue Zosyn - Currently n.p.o. for possible IR procedure - Hold any nephrotoxic agents when possible - Continue to monitor

## 2022-08-13 DIAGNOSIS — C679 Malignant neoplasm of bladder, unspecified: Secondary | ICD-10-CM

## 2022-08-13 DIAGNOSIS — F5101 Primary insomnia: Secondary | ICD-10-CM | POA: Diagnosis not present

## 2022-08-13 DIAGNOSIS — N151 Renal and perinephric abscess: Secondary | ICD-10-CM | POA: Diagnosis not present

## 2022-08-13 DIAGNOSIS — R6510 Systemic inflammatory response syndrome (SIRS) of non-infectious origin without acute organ dysfunction: Secondary | ICD-10-CM | POA: Diagnosis not present

## 2022-08-13 LAB — BASIC METABOLIC PANEL
Anion gap: 7 (ref 5–15)
BUN: 16 mg/dL (ref 6–20)
CO2: 27 mmol/L (ref 22–32)
Calcium: 8.6 mg/dL — ABNORMAL LOW (ref 8.9–10.3)
Chloride: 104 mmol/L (ref 98–111)
Creatinine, Ser: 1.21 mg/dL (ref 0.61–1.24)
GFR, Estimated: >60 mL/min (ref >60)
Glucose, Bld: 87 mg/dL (ref 70–99)
Potassium: 4 mmol/L (ref 3.5–5.1)
Sodium: 138 mmol/L (ref 135–145)

## 2022-08-13 LAB — CBC
HCT: 38.4 % — ABNORMAL LOW (ref 39.0–52.0)
Hemoglobin: 12.6 g/dL — ABNORMAL LOW (ref 13.0–17.0)
MCH: 29.9 pg (ref 26.0–34.0)
MCHC: 32.8 g/dL (ref 30.0–36.0)
MCV: 91.2 fL (ref 80.0–100.0)
Platelets: 167 10*3/uL (ref 150–400)
RBC: 4.21 MIL/uL — ABNORMAL LOW (ref 4.22–5.81)
RDW: 13.8 % (ref 11.5–15.5)
WBC: 7.8 10*3/uL (ref 4.0–10.5)
nRBC: 0 % (ref 0.0–0.2)

## 2022-08-13 LAB — URINE CULTURE: Culture: NO GROWTH

## 2022-08-13 NOTE — Progress Notes (Signed)
Patient rested quietly throughout night no c/o pain or discomfort voiced. IV therapy infusing, patient ambulatory able to express needs with call bell in reach.

## 2022-08-13 NOTE — Progress Notes (Signed)
Pt has ambulated independently in is room and in the hallway this shift. PT has denied any pain or discomfort. Pt has remained in a pleasant mood and vitals have remained stable.

## 2022-08-13 NOTE — Consult Note (Signed)
Urology Consult  Referring physician: Dr. Denton Brick Reason for referral: left renal abscess, bladder cancer  Chief Complaint: bilateral flank pain  History of Present Illness: [name] is a 56yo with a history of muscle invasive bladder cancer and right ureteral obstruction managed with a nephrostomy tube who was admitted with bilateral flank pain and left renal abscess. He has been having mild right flank pain since nephrostomy tube change 3 weeks ago and then then the pain worsened and he developed new left flank pain. CT obtained in the ER showed a new 3cm left renal abscess. He denies any fevers of chills. His left flank pain is sharp, intermittent, mild to moderate and nonraditing. No other associated symptoms. He has known muscle invasive bladder cancer and has seen Dr. Tresa Moore at [facility]. The patient has decided not to pursue chemotherapy or radical cystectomy.   Past Medical History:  Diagnosis Date   Abscess    Right buttocks   Bladder tumor    Depression    Son passed 2014   Ear infection    Eating disorder    Frequent headaches    GERD (gastroesophageal reflux disease)    Helicobacter pylori ab+ 10/27/2017   Hepatitis    childhood, jaundice   Migraines    Urinary incontinence    Varicose vein of leg    Right leg   Vitamin D deficiency    Past Surgical History:  Procedure Laterality Date   CYSTOSCOPY W/ URETERAL STENT PLACEMENT Right 11/19/2017   Procedure: CYSTOSCOPY WITH RETROGRADE PYELOGRAM/URETERAL STENT PLACEMENT;  Surgeon: Ceasar Mons, MD;  Location: [facility];  Service: Urology;  Laterality: Right;   CYSTOSCOPY WITH FULGERATION N/A 11/19/2017   Procedure: [place] AND CLOT EVACUATION;  Surgeon: Nickie Retort, MD;  Location: [facility];  Service: Urology;  Laterality: N/A;   IR NEPHROSTOMY EXCHANGE RIGHT  07/20/2022   IR NEPHROSTOMY PLACEMENT RIGHT  05/26/2022   TRANSURETHRAL RESECTION OF BLADDER TUMOR N/A  11/19/2017   Procedure: TRANSURETHRAL RESECTION OF BLADDER TUMOR (TURBT);  Surgeon: Ceasar Mons, MD;  Location: [facility];  Service: Urology;  Laterality: N/A;   TRANSURETHRAL RESECTION OF BLADDER TUMOR N/A 05/18/2022   Procedure: TRANSURETHRAL RESECTION OF BLADDER TUMOR (TURBT);  Surgeon: Cleon Gustin, MD;  Location: [facility];  Service: Urology;  Laterality: N/A;    Medications: I have reviewed the patient's current medications. Allergies:  Allergies  Allergen Reactions   Macrobid [Nitrofurantoin]     ?epig abd pain    Family History  Problem Relation Age of Onset   Cancer Mother    Depression Mother    Arthritis Father    Depression Father    Hearing loss Father    Depression Daughter    Early death Son    Social History:  reports that he has been smoking cigarettes. He has a 30.00 pack-year smoking history. He has never used smokeless tobacco. He reports current alcohol use. He reports that he does not use drugs.  Review of Systems  Genitourinary:  Positive for flank pain.  All other systems reviewed and are negative.   Physical Exam:  Vital signs in last 24 hours: Temp:  [97.7 F (36.5 C)-98.1 F (36.7 C)] 97.7 F (36.5 C) (11/16 1300) Pulse Rate:  [72-102] 72 (11/16 1300) Resp:  [18-20] 18 (11/16 1300) BP: (103-110)/(67-76) 110/67 (11/16 1300) SpO2:  [98 %-100 %] 98 % (11/16 1300) Physical Exam Vitals reviewed.  Constitutional:  Appearance: He is well-developed.  HENT:     Head: Normocephalic and atraumatic.  Eyes:     Extraocular Movements: Extraocular movements intact.     Pupils: Pupils are equal, round, and reactive to light.  Pulmonary:     Effort: Pulmonary effort is normal. No respiratory distress.  Abdominal:     General: Abdomen is flat. There is no distension.  Skin:    General: Skin is warm and dry.  Neurological:     General: No focal deficit present.     Mental Status: He is alert and oriented to person,  place, and time.  Psychiatric:        Mood and Affect: Mood normal.        Behavior: Behavior normal.     Laboratory Data:  Results for orders placed or performed during the hospital encounter of 08/11/22 (from the past 72 hour(s))  Lactic acid, plasma     Status: None   Collection Time: 08/11/22  7:09 PM  Result Value Ref Range   Lactic Acid, Venous 0.8 0.5 - 1.9 mmol/L    Comment: Performed at [facility], [address], [place], [place] [address]  Comprehensive metabolic panel     Status: Abnormal   Collection Time: 08/11/22  7:09 PM  Result Value Ref Range   Sodium 134 (L) 135 - 145 mmol/L   Potassium 3.7 3.5 - 5.1 mmol/L   Chloride 102 98 - 111 mmol/L   CO2 21 (L) 22 - 32 mmol/L   Glucose, Bld 104 (H) 70 - 99 mg/dL    Comment: Glucose reference range applies only to samples taken after fasting for at least 8 hours.   BUN 16 6 - 20 mg/dL   Creatinine, Ser 1.08 0.61 - 1.24 mg/dL   Calcium 8.9 8.9 - 10.3 mg/dL   Total Protein 7.4 6.5 - 8.1 g/dL   Albumin 4.2 3.5 - 5.0 g/dL   AST 11 (L) 15 - 41 U/L   ALT 12 0 - 44 U/L   Alkaline Phosphatase 54 38 - 126 U/L   Total Bilirubin 1.4 (H) 0.3 - 1.2 mg/dL   GFR, Estimated >60 >60 mL/min    Comment: (NOTE) Calculated using the CKD-EPI Creatinine Equation (2021)    Anion gap 11 5 - 15    Comment: Performed at [facility], [address] [place], [place] [address]  CBC with Differential     Status: Abnormal   Collection Time: 08/11/22  7:09 PM  Result Value Ref Range   WBC 12.2 (H) 4.0 - 10.5 K/uL   RBC 4.73 4.22 - 5.81 MIL/uL   Hemoglobin 14.4 13.0 - 17.0 g/dL   HCT 42.3 39.0 - 52.0 %   MCV 89.4 80.0 - 100.0 fL   MCH 30.4 26.0 - 34.0 pg   MCHC 34.0 30.0 - 36.0 g/dL   RDW 13.9 11.5 - 15.5 %   Platelets 197 150 - 400 K/uL   nRBC 0.0 0.0 - 0.2 %   Neutrophils Relative % 80 %   Neutro Abs 9.8 (H) 1.7 - 7.7 K/uL   Lymphocytes Relative 12 %   Lymphs Abs 1.4 0.7 - 4.0 K/uL   Monocytes Relative 8 %   Monocytes Absolute 0.9  0.1 - 1.0 K/uL   Eosinophils Relative 0 %   Eosinophils Absolute 0.0 0.0 - 0.5 K/uL   Basophils Relative 0 %   Basophils Absolute 0.0 0.0 - 0.1 K/uL   Immature Granulocytes 0 %   Abs Immature  Granulocytes 0.03 0.00 - 0.07 K/uL    Comment: Performed at [facility], [address] [place], [place] [address]  Urinalysis, Routine w reflex microscopic Urine, Clean Catch     Status: Abnormal   Collection Time: 08/11/22  8:01 PM  Result Value Ref Range   Color, Urine YELLOW YELLOW   APPearance CLOUDY (A) CLEAR   Specific Gravity, Urine 1.011 1.005 - 1.030   pH 6.0 5.0 - 8.0   Glucose, UA NEGATIVE NEGATIVE mg/dL   Hgb urine dipstick MODERATE (A) NEGATIVE   Bilirubin Urine NEGATIVE NEGATIVE   Ketones, ur 20 (A) NEGATIVE mg/dL   Protein, ur 30 (A) NEGATIVE mg/dL   Nitrite POSITIVE (A) NEGATIVE   Leukocytes,Ua LARGE (A) NEGATIVE   RBC / HPF 21-50 0 - 5 RBC/hpf   WBC, UA >50 (H) 0 - 5 WBC/hpf   Bacteria, UA RARE (A) NONE SEEN   Squamous Epithelial / LPF 0-5 0 - 5   WBC Clumps PRESENT     Comment: Performed at [facility], [address], [place], [place] [address]  Resp Panel by RT-PCR (Flu A&B, Covid) Urine, Clean Catch     Status: None   Collection Time: 08/11/22  8:01 PM   Specimen: Urine, Clean Catch; Nasal Swab  Result Value Ref Range   SARS Coronavirus 2 by RT PCR NEGATIVE NEGATIVE    Comment: (NOTE) SARS-CoV-2 target nucleic acids are NOT DETECTED.  The SARS-CoV-2 RNA is generally detectable in upper respiratory specimens during the acute phase of infection. The lowest concentration of SARS-CoV-2 viral copies this assay can detect is 138 copies/mL. A negative result does not preclude SARS-Cov-2 infection and should not be used as the sole basis for treatment or other patient management decisions. A negative result may occur with  improper specimen collection/handling, submission of specimen other than nasopharyngeal swab, presence of viral mutation(s) within the areas targeted  by this assay, and inadequate number of viral copies(<138 copies/mL). A negative result must be combined with clinical observations, patient history, and epidemiological information. The expected result is Negative.  Fact Sheet for Patients:  EntrepreneurPulse.com.au  Fact Sheet for Healthcare Providers:  IncredibleEmployment.be  This test is no t yet approved or cleared by the [place] FDA and  has been authorized for detection and/or diagnosis of SARS-CoV-2 by FDA under an Emergency Use Authorization (EUA). This EUA will remain  in effect (meaning this test can be used) for the duration of the COVID-19 declaration under Section 564(b)(1) of the Act, 21 U.S.C.section 360bbb-3(b)(1), unless the authorization is terminated  or revoked sooner.       Influenza A by PCR NEGATIVE NEGATIVE   Influenza B by PCR NEGATIVE NEGATIVE    Comment: (NOTE) The Xpert Xpress SARS-CoV-2/FLU/RSV plus assay is intended as an aid in the diagnosis of influenza from Nasopharyngeal swab specimens and should not be used as a sole basis for treatment. Nasal washings and aspirates are unacceptable for Xpert Xpress SARS-CoV-2/FLU/RSV testing.  Fact Sheet for Patients: EntrepreneurPulse.com.au  Fact Sheet for Healthcare Providers: IncredibleEmployment.be  This test is not yet approved or cleared by the [place] FDA and has been authorized for detection and/or diagnosis of SARS-CoV-2 by FDA under an Emergency Use Authorization (EUA). This EUA will remain in effect (meaning this test can be used) for the duration of the COVID-19 declaration under Section 564(b)(1) of the Act, 21 U.S.C. section 360bbb-3(b)(1), unless the authorization is terminated or revoked.  Performed at [facility], [address] [place],  [place] [address]   Blood culture (routine x 2)     Status: None (Preliminary result)   Collection Time: 08/11/22   8:34 PM   Specimen: Left Antecubital; Blood  Result Value Ref Range   Specimen Description LEFT ANTECUBITAL    Special Requests      BOTTLES DRAWN AEROBIC AND ANAEROBIC Blood Culture adequate volume   Culture      NO GROWTH < 12 HOURS Performed at [facility], [address] [place], [place] [address]    Report Status PENDING   Blood culture (routine x 2)     Status: None (Preliminary result)   Collection Time: 08/11/22  8:34 PM   Specimen: BLOOD LEFT HAND  Result Value Ref Range   Specimen Description BLOOD LEFT HAND    Special Requests      BOTTLES DRAWN AEROBIC AND ANAEROBIC Blood Culture adequate volume   Culture      NO GROWTH < 12 HOURS Performed at [facility], [address] [place], [place] [address]    Report Status PENDING   Lactic acid, plasma     Status: None   Collection Time: 08/11/22  8:35 PM  Result Value Ref Range   Lactic Acid, Venous 0.9 0.5 - 1.9 mmol/L    Comment: Performed at [facility], [address] [place], [place] [address]  HIV Antibody (routine testing w rflx)     Status: None   Collection Time: 08/11/22  8:43 PM  Result Value Ref Range   HIV Screen 4th Generation wRfx Non Reactive Non Reactive    Comment: Performed at [facility], [place] [address], [place], [place] [address]  Urine Culture     Status: None   Collection Time: 08/11/22  9:26 PM   Specimen: Urine, Clean Catch  Result Value Ref Range   Specimen Description      URINE, CLEAN CATCH Performed at [facility], [address], [place], [place] [address]    Special Requests      NONE Performed at [facility], [address] [place], [place] [address]    Culture      NO GROWTH Performed at [facility], [place] [address] [place], [place] [address]    Report Status 08/13/2022 FINAL   Comprehensive metabolic panel     Status: Abnormal   Collection Time: 08/12/22  3:52 AM  Result Value Ref Range   Sodium 136 135 - 145 mmol/L   Potassium 3.9 3.5 - 5.1 mmol/L    Chloride 104 98 - 111 mmol/L   CO2 24 22 - 32 mmol/L   Glucose, Bld 95 70 - 99 mg/dL    Comment: Glucose reference range applies only to samples taken after fasting for at least 8 hours.   BUN 15 6 - 20 mg/dL   Creatinine, Ser 1.05 0.61 - 1.24 mg/dL   Calcium 8.6 (L) 8.9 - 10.3 mg/dL   Total Protein 6.3 (L) 6.5 - 8.1 g/dL   Albumin 3.5 3.5 - 5.0 g/dL   AST 11 (L) 15 - 41 U/L   ALT 13 0 - 44 U/L   Alkaline Phosphatase 45 38 - 126 U/L   Total Bilirubin 1.1 0.3 - 1.2 mg/dL   GFR, Estimated >60 >60 mL/min    Comment: (NOTE) Calculated using the CKD-EPI Creatinine Equation (2021)    Anion gap 8 5 - 15    Comment: Performed at [facility], [address], [place], [place] [address]  Magnesium  Status: None   Collection Time: 08/12/22  3:52 AM  Result Value Ref Range   Magnesium 2.0 1.7 - 2.4 mg/dL    Comment: Performed at [facility], [address] [place], [place] [address]  CBC with Differential/Platelet     Status: Abnormal   Collection Time: 08/12/22  3:52 AM  Result Value Ref Range   WBC 8.7 4.0 - 10.5 K/uL   RBC 4.20 (L) 4.22 - 5.81 MIL/uL   Hemoglobin 12.8 (L) 13.0 - 17.0 g/dL   HCT 37.9 (L) 39.0 - 52.0 %   MCV 90.2 80.0 - 100.0 fL   MCH 30.5 26.0 - 34.0 pg   MCHC 33.8 30.0 - 36.0 g/dL   RDW 13.9 11.5 - 15.5 %   Platelets 151 150 - 400 K/uL   nRBC 0.0 0.0 - 0.2 %   Neutrophils Relative % 64 %   Neutro Abs 5.5 1.7 - 7.7 K/uL   Lymphocytes Relative 23 %   Lymphs Abs 2.0 0.7 - 4.0 K/uL   Monocytes Relative 12 %   Monocytes Absolute 1.0 0.1 - 1.0 K/uL   Eosinophils Relative 1 %   Eosinophils Absolute 0.1 0.0 - 0.5 K/uL   Basophils Relative 0 %   Basophils Absolute 0.0 0.0 - 0.1 K/uL   Immature Granulocytes 0 %   Abs Immature Granulocytes 0.03 0.00 - 0.07 K/uL    Comment: Performed at [facility], [address] [place], [place] [address]  Protime-INR     Status: None   Collection Time: 08/12/22  6:19 AM  Result Value Ref Range   Prothrombin Time 14.2 11.4 -  15.2 seconds   INR 1.1 0.8 - 1.2    Comment: (NOTE) INR goal varies based on device and disease states. Performed at [facility], [address] [place], [place] [address]   CBC     Status: Abnormal   Collection Time: 08/13/22  3:29 AM  Result Value Ref Range   WBC 7.8 4.0 - 10.5 K/uL   RBC 4.21 (L) 4.22 - 5.81 MIL/uL   Hemoglobin 12.6 (L) 13.0 - 17.0 g/dL   HCT 38.4 (L) 39.0 - 52.0 %   MCV 91.2 80.0 - 100.0 fL   MCH 29.9 26.0 - 34.0 pg   MCHC 32.8 30.0 - 36.0 g/dL   RDW 13.8 11.5 - 15.5 %   Platelets 167 150 - 400 K/uL   nRBC 0.0 0.0 - 0.2 %    Comment: Performed at [facility], [address] [place], [place] [address]  Basic metabolic panel     Status: Abnormal   Collection Time: 08/13/22  3:29 AM  Result Value Ref Range   Sodium 138 135 - 145 mmol/L   Potassium 4.0 3.5 - 5.1 mmol/L   Chloride 104 98 - 111 mmol/L   CO2 27 22 - 32 mmol/L   Glucose, Bld 87 70 - 99 mg/dL    Comment: Glucose reference range applies only to samples taken after fasting for at least 8 hours.   BUN 16 6 - 20 mg/dL   Creatinine, Ser 1.21 0.61 - 1.24 mg/dL   Calcium 8.6 (L) 8.9 - 10.3 mg/dL   GFR, Estimated >60 >60 mL/min    Comment: (NOTE) Calculated using the CKD-EPI Creatinine Equation (2021)    Anion gap 7 5 - 15    Comment: Performed at [facility], [address], [place], [place] [address]   Recent Results (from the past 240 hour(s))  Resp Panel by RT-PCR (  Flu A&B, Covid) Urine, Clean Catch     Status: None   Collection Time: 08/11/22  8:01 PM   Specimen: Urine, Clean Catch; Nasal Swab  Result Value Ref Range Status   SARS Coronavirus 2 by RT PCR NEGATIVE NEGATIVE Final    Comment: (NOTE) SARS-CoV-2 target nucleic acids are NOT DETECTED.  The SARS-CoV-2 RNA is generally detectable in upper respiratory specimens during the acute phase of infection. The lowest concentration of SARS-CoV-2 viral copies this assay can detect is 138 copies/mL. A negative result does not preclude  SARS-Cov-2 infection and should not be used as the sole basis for treatment or other patient management decisions. A negative result may occur with  improper specimen collection/handling, submission of specimen other than nasopharyngeal swab, presence of viral mutation(s) within the areas targeted by this assay, and inadequate number of viral copies(<138 copies/mL). A negative result must be combined with clinical observations, patient history, and epidemiological information. The expected result is Negative.  Fact Sheet for Patients:  EntrepreneurPulse.com.au  Fact Sheet for Healthcare Providers:  IncredibleEmployment.be  This test is no t yet approved or cleared by the [place] FDA and  has been authorized for detection and/or diagnosis of SARS-CoV-2 by FDA under an Emergency Use Authorization (EUA). This EUA will remain  in effect (meaning this test can be used) for the duration of the COVID-19 declaration under Section 564(b)(1) of the Act, 21 U.S.C.section 360bbb-3(b)(1), unless the authorization is terminated  or revoked sooner.       Influenza A by PCR NEGATIVE NEGATIVE Final   Influenza B by PCR NEGATIVE NEGATIVE Final    Comment: (NOTE) The Xpert Xpress SARS-CoV-2/FLU/RSV plus assay is intended as an aid in the diagnosis of influenza from Nasopharyngeal swab specimens and should not be used as a sole basis for treatment. Nasal washings and aspirates are unacceptable for Xpert Xpress SARS-CoV-2/FLU/RSV testing.  Fact Sheet for Patients: EntrepreneurPulse.com.au  Fact Sheet for Healthcare Providers: IncredibleEmployment.be  This test is not yet approved or cleared by the [place] FDA and has been authorized for detection and/or diagnosis of SARS-CoV-2 by FDA under an Emergency Use Authorization (EUA). This EUA will remain in effect (meaning this test can be used) for the duration of  the COVID-19 declaration under Section 564(b)(1) of the Act, 21 U.S.C. section 360bbb-3(b)(1), unless the authorization is terminated or revoked.  Performed at [facility], [address] [place], [place] [address]   Blood culture (routine x 2)     Status: None (Preliminary result)   Collection Time: 08/11/22  8:34 PM   Specimen: Left Antecubital; Blood  Result Value Ref Range Status   Specimen Description LEFT ANTECUBITAL  Final   Special Requests   Final    BOTTLES DRAWN AEROBIC AND ANAEROBIC Blood Culture adequate volume   Culture   Final    NO GROWTH < 12 HOURS Performed at [facility], [address], [place], [place] [address]    Report Status PENDING  Incomplete  Blood culture (routine x 2)     Status: None (Preliminary result)   Collection Time: 08/11/22  8:34 PM   Specimen: BLOOD LEFT HAND  Result Value Ref Range Status   Specimen Description BLOOD LEFT HAND  Final   Special Requests   Final    BOTTLES DRAWN AEROBIC AND ANAEROBIC Blood Culture adequate volume   Culture   Final    NO GROWTH < 12 HOURS Performed at [facility], [address], [place], [place] [address]  Report Status PENDING  Incomplete  Urine Culture     Status: None   Collection Time: 08/11/22  9:26 PM   Specimen: Urine, Clean Catch  Result Value Ref Range Status   Specimen Description   Final    URINE, CLEAN CATCH Performed at [facility], [address] [place], [place] [address]    Special Requests   Final    NONE Performed at [facility], [address] [place], [place] [address]    Culture   Final    NO GROWTH Performed at [facility], [place] [address] [place], [place] [address]    Report Status 08/13/2022 FINAL  Final   Creatinine: Recent Labs    08/11/22 1909 08/12/22 0352 08/13/22 0329  CREATININE 1.08 1.05 1.21   Baseline Creatinine: 1  Impression/Assessment:  56yo with left renal abscess and bladder cancer  Plan:  Renal abscess: I discussed the management  of renal abscesses with the patient. The abscess in not amenable to drain placement due to size and can be treated with antibiotics. Patient will require 2 weeks of cipro versus bactrim. He will followup in my office in 2 weeks with renal US to assess treatment response  Muscle Invasive bladder cancer: The patient is currently wishing to pursue surveillance of his bladder cancer and has followup with both Dr. Tresa Moore and Dr. Delton Coombes in 09/2022.   Christian Kennedy 08/13/2022, 1:38 PM

## 2022-08-13 NOTE — Progress Notes (Signed)
PROGRESS NOTE     Christian Kennedy, is a 56 y.o. male, DOB - Jun 06, 1966, ERD:[id number]  Admit date - 08/11/2022   Admitting Physician Rolla Plate, DO  Outpatient Primary MD for the patient is Plotnikov, Evie Lacks, MD  LOS - 1  Chief Complaint  Patient presents with   Abdominal Pain   Fever        Brief Narrative:   56yo with a history of muscle invasive bladder cancer and right ureteral obstruction managed with a nephrostomy tube ( status post surgery with Alyson Ingles), h/o  depression, GERD, vitamin D deficiency, hepatitis admitted on 08/12/22 with concerns for UTI and left-sided renal abscess    -Assessment and Plan: 1)Lt Renal Abscess--concerns for possible complicated UTI in a patient with indwelling right-sided nephrostomy tube in the setting of bladder malignancy - CT scan shows 2.3 cm left upper pole renal abscess progressive from prior CT (2.3 cm vague fluid density lesion with overlying inflammatory changes in the left upper kidney (series 2/image 32), previously measuring 10 mm, likely reflecting a renal abscess given the rapid progression.) -Urology and IR input appreciated -Not amenable to drainage -Antibiotic management advised -- Continue Zosyn pending urine and blood culture data -Sepsis has not been ruled in--cultures NGTD -If cultures remain negative on 08/14/2022 May discharge home on oral Cipro as recommended by ID team and Dr. Alyson Ingles from neurology -Patient will need repeat renal ultrasound in a couple weeks to assess response to treatment  2)Urothelial malignancy/muscle invasive bladder cancer-- -patient declined chemotherapy patient declines cystectomy/surgery from Dr. Tresa Moore -Patient is advised to follow-up with urologist Dr. Tresa Moore and Dr. Delton Coombes at the oncologist to discuss further work-up and treatment options  3)Smoker --Smokes a pack per day - Continue nicotine patch  4)Insomnia - Continue Ambien  Disposition/Need for in-Hospital  Stay- patient unable to be discharged at this time due to possible complicated UTI right-sided*  Status is: Inpatient   Disposition: The patient is from: Home              Anticipated d/c is to: Home              Anticipated d/c date is: 1 day              Patient currently is not medically stable to d/c. Barriers: Not Clinically Stable-   Code Status :  -  Code Status: Full Code   Family Communication:    NA (patient is alert, awake and coherent)   DVT Prophylaxis  :   - SCDs   heparin injection 5,000 Units Start: 08/13/22 0600 SCDs Start: 08/12/22 0037   Lab Results  Component Value Date   PLT 167 08/13/2022   Inpatient Medications  Scheduled Meds:  heparin  5,000 Units Subcutaneous Q8H   nicotine  21 mg Transdermal Daily   Continuous Infusions:  sodium chloride 75 mL/hr at 08/13/22 0927   piperacillin-tazobactam (ZOSYN)  IV 3.375 g (08/13/22 1415)   PRN Meds:.morphine injection, Muscle Rub, ondansetron **OR** ondansetron (ZOFRAN) IV, oxyCODONE, traMADol, zolpidem   Anti-infectives (From admission, onward)    Start     Dose/Rate Route Frequency Ordered Stop   08/12/22 0600  piperacillin-tazobactam (ZOSYN) IVPB 3.375 g        3.375 g 12.5 mL/hr over 240 Minutes Intravenous Every 8 hours 08/11/22 2347     08/12/22 0000  piperacillin-tazobactam (ZOSYN) IVPB 3.375 g        3.375 g 100 mL/hr over 30 Minutes Intravenous  Once 08/11/22 2347  08/12/22 0110   08/11/22 2130  cefTRIAXone (ROCEPHIN) 1 g in sodium chloride 0.9 % 100 mL IVPB        1 g 200 mL/hr over 30 Minutes Intravenous  Once 08/11/22 2125 08/11/22 2335       Subjective: Christian Kennedy today has no fevers, no emesis,  No chest pain,   -Voiding well -Flank pain improving  Objective: Vitals:   08/12/22 1221 08/12/22 1617 08/12/22 2029 08/13/22 1300  BP: 111/67 103/67 107/76 110/67  Pulse: 77 95 (!) 102 72  Resp: '18 20 20 18  '$ Temp: 98.5 F (36.9 C) 98.1 F (36.7 C) 97.7 F (36.5 C) 97.7 F (36.5  C)  TempSrc: Oral   Oral  SpO2: 97% 100% 99% 98%  Weight:      Height:        Intake/Output Summary (Last 24 hours) at 08/13/2022 1857 Last data filed at 08/13/2022 1700 Gross per 24 hour  Intake 1890 ml  Output --  Net 1890 ml   Filed Weights   08/11/22 1837 08/12/22 0740  Weight: 99.6 kg 99.6 kg    Physical Exam  Gen:- Awake Alert,  in no apparent distress  HEENT:- [place].AT, No sclera icterus Neck-Supple Neck,No JVD,.  Lungs-  CTAB , fair symmetrical air movement CV- S1, S2 normal, regular  Abd-  +ve B.Sounds, Abd Soft, No tenderness, no CVA area tenderness   , right-sided nephrostomy tube with good drainage Extremity/Skin:- No  edema, pedal pulses present  Psych-affect is appropriate, oriented x3 Neuro-no new focal deficits, no tremors  Data Reviewed: I have personally reviewed following labs and imaging studies  CBC: Recent Labs  Lab 08/11/22 1909 08/12/22 0352 08/13/22 0329  WBC 12.2* 8.7 7.8  NEUTROABS 9.8* 5.5  --   HGB 14.4 12.8* 12.6*  HCT 42.3 37.9* 38.4*  MCV 89.4 90.2 91.2  PLT 197 151 403   Basic Metabolic Panel: Recent Labs  Lab 08/11/22 1909 08/12/22 0352 08/13/22 0329  NA 134* 136 138  K 3.7 3.9 4.0  CL 102 104 104  CO2 21* 24 27  GLUCOSE 104* 95 87  BUN '16 15 16  '$ CREATININE 1.08 1.05 1.21  CALCIUM 8.9 8.6* 8.6*  MG  --  2.0  --    GFR: Estimated Creatinine Clearance: 80.5 mL/min (by C-G formula based on SCr of 1.21 mg/dL). Liver Function Tests: Recent Labs  Lab 08/11/22 1909 08/12/22 0352  AST 11* 11*  ALT 12 13  ALKPHOS 54 45  BILITOT 1.4* 1.1  PROT 7.4 6.3*  ALBUMIN 4.2 3.5   ) Recent Results (from the past 240 hour(s))  Resp Panel by RT-PCR (Flu A&B, Covid) Urine, Clean Catch     Status: None   Collection Time: 08/11/22  8:01 PM   Specimen: Urine, Clean Catch; Nasal Swab  Result Value Ref Range Status   SARS Coronavirus 2 by RT PCR NEGATIVE NEGATIVE Final    Comment: (NOTE) SARS-CoV-2 target nucleic acids are NOT  DETECTED.  The SARS-CoV-2 RNA is generally detectable in upper respiratory specimens during the acute phase of infection. The lowest concentration of SARS-CoV-2 viral copies this assay can detect is 138 copies/mL. A negative result does not preclude SARS-Cov-2 infection and should not be used as the sole basis for treatment or other patient management decisions. A negative result may occur with  improper specimen collection/handling, submission of specimen other than nasopharyngeal swab, presence of viral mutation(s) within the areas targeted by this assay, and inadequate number of viral  copies(<138 copies/mL). A negative result must be combined with clinical observations, patient history, and epidemiological information. The expected result is Negative.  Fact Sheet for Patients:  EntrepreneurPulse.com.au  Fact Sheet for Healthcare Providers:  IncredibleEmployment.be  This test is no t yet approved or cleared by the [place] FDA and  has been authorized for detection and/or diagnosis of SARS-CoV-2 by FDA under an Emergency Use Authorization (EUA). This EUA will remain  in effect (meaning this test can be used) for the duration of the COVID-19 declaration under Section 564(b)(1) of the Act, 21 U.S.C.section 360bbb-3(b)(1), unless the authorization is terminated  or revoked sooner.       Influenza A by PCR NEGATIVE NEGATIVE Final   Influenza B by PCR NEGATIVE NEGATIVE Final    Comment: (NOTE) The Xpert Xpress SARS-CoV-2/FLU/RSV plus assay is intended as an aid in the diagnosis of influenza from Nasopharyngeal swab specimens and should not be used as a sole basis for treatment. Nasal washings and aspirates are unacceptable for Xpert Xpress SARS-CoV-2/FLU/RSV testing.  Fact Sheet for Patients: EntrepreneurPulse.com.au  Fact Sheet for Healthcare Providers: IncredibleEmployment.be  This test is not yet  approved or cleared by the [place] FDA and has been authorized for detection and/or diagnosis of SARS-CoV-2 by FDA under an Emergency Use Authorization (EUA). This EUA will remain in effect (meaning this test can be used) for the duration of the COVID-19 declaration under Section 564(b)(1) of the Act, 21 U.S.C. section 360bbb-3(b)(1), unless the authorization is terminated or revoked.  Performed at [facility], [address] [place], [place] [address]   Blood culture (routine x 2)     Status: None (Preliminary result)   Collection Time: 08/11/22  8:34 PM   Specimen: Left Antecubital; Blood  Result Value Ref Range Status   Specimen Description LEFT ANTECUBITAL  Final   Special Requests   Final    BOTTLES DRAWN AEROBIC AND ANAEROBIC Blood Culture adequate volume   Culture   Final    NO GROWTH < 12 HOURS Performed at [facility], [address], [place], [place] [address]    Report Status PENDING  Incomplete  Blood culture (routine x 2)     Status: None (Preliminary result)   Collection Time: 08/11/22  8:34 PM   Specimen: BLOOD LEFT HAND  Result Value Ref Range Status   Specimen Description BLOOD LEFT HAND  Final   Special Requests   Final    BOTTLES DRAWN AEROBIC AND ANAEROBIC Blood Culture adequate volume   Culture   Final    NO GROWTH < 12 HOURS Performed at [facility], [address] [place], [place] [address]    Report Status PENDING  Incomplete  Urine Culture     Status: None   Collection Time: 08/11/22  9:26 PM   Specimen: Urine, Clean Catch  Result Value Ref Range Status   Specimen Description   Final    URINE, CLEAN CATCH Performed at [facility], [address] [place], [place] [address]    Special Requests   Final    NONE Performed at [facility], [address], [place], [place] [address]    Culture   Final    NO GROWTH Performed at [facility], [place] [address] [place], [place] [address]    Report Status 08/13/2022 FINAL  Final      Radiology Studies: CT ABDOMEN PELVIS W CONTRAST  Result Date: 08/11/2022 CLINICAL DATA:  Right abdominal/flank pain, fever. Bladder cancer, status post right nephrostomy.  EXAM: CT ABDOMEN AND PELVIS WITH CONTRAST TECHNIQUE: Multidetector CT imaging of the abdomen and pelvis was performed using the standard protocol following bolus administration of intravenous contrast. RADIATION DOSE REDUCTION: This exam was performed according to the departmental dose-optimization program which includes automated exposure control, adjustment of the mA and/or kV according to patient size and/or use of iterative reconstruction technique. CONTRAST:  18m OMNIPAQUE IOHEXOL 300 MG/ML  SOLN COMPARISON:  06/15/2022 FINDINGS: Lower chest: Mild left basilar atelectasis. Hepatobiliary: Liver is within normal limits. Layering gallbladder sludge (series 2/image 37), without associated inflammatory changes. No intrahepatic or extrahepatic ductal dilatation. Pancreas: Within normal limits. Spleen: Within normal limits. Adrenals/Urinary Tract: Adrenal glands are within normal limits. 2.3 cm vague fluid density lesion with overlying inflammatory changes in the left upper kidney (series 2/image 32), previously measuring 10 mm, likely reflecting a renal abscess given the rapid progression. Right kidney is notable for a percutaneous nephrostomy in satisfactory position. No hydronephrosis. Mild wall thickening/urothelial enhancement involving the distal right ureter (series 2/image 78) with asymmetric wall thickening involving the right posterolateral bladder (series 2/image 81), possibly related to the patient's known urothelial carcinoma. Stomach/Bowel: Stomach is within normal limits. No evidence of bowel obstruction. Normal appendix (series 2/image 89). No colonic wall thickening or inflammatory changes. Vascular/Lymphatic: No evidence of abdominal aortic aneurysm. Atherosclerotic calcifications of the abdominal aorta and branch vessels.  No suspicious abdominopelvic lymphadenopathy. Reproductive: Mild prostatomegaly. Other: No abdominopelvic ascites. Musculoskeletal: Degenerative changes of the visualized thoracolumbar spine. IMPRESSION: Suspected 2.3 cm left upper pole renal abscess, progressive from prior CT, with associated perinephric stranding. Asymmetric wall thickening involving the right posterolateral bladder and distal right ureter, possibly related to the patient's known urothelial carcinoma. Right percutaneous nephrostomy in satisfactory position. No hydronephrosis. Additional ancillary findings as above. Electronically Signed   By: SJulian HyM.D.   On: 08/11/2022 22:33   DG Chest 2 View  Result Date: 08/11/2022 CLINICAL DATA:  Right flank pain and fever since last night. EXAM: CHEST - 2 VIEW COMPARISON:  Chest, abdomen and pelvis CT dated 06/15/2022 FINDINGS: Normal sized heart. Clear lungs. Possible small amount of posterior pleural fluid on the lateral view. Mild-to-moderate peribronchial thickening. Thoracic spine degenerative changes. IMPRESSION: 1. Possible small amount of posterior pleural fluid on the lateral view. 2. Mild to moderate bronchitic changes. Electronically Signed   By: SClaudie ReveringM.D.   On: 08/11/2022 19:25     Scheduled Meds:  heparin  5,000 Units Subcutaneous Q8H   nicotine  21 mg Transdermal Daily   Continuous Infusions:  sodium chloride 75 mL/hr at 08/13/22 0927   piperacillin-tazobactam (ZOSYN)  IV 3.375 g (08/13/22 1415)     LOS: 1 day    CRoxan HockeyM.D on 08/13/2022 at 6:57 PM  Go to [email or web address] - for contact info  [facility] - Office  [phone] If 7PM-7AM, please contact night-coverage [email or web address] 08/13/2022, 6:57 PM

## 2022-08-14 DIAGNOSIS — N151 Renal and perinephric abscess: Secondary | ICD-10-CM | POA: Diagnosis not present

## 2022-08-14 MED ORDER — CIPROFLOXACIN HCL 500 MG PO TABS: 500.0000 mg | ORAL_TABLET | Freq: Two times a day (BID) | ORAL | 0 refills | Status: AC

## 2022-08-14 MED ORDER — NICOTINE 14 MG/24HR TD PT24: 14.0000 mg | MEDICATED_PATCH | Freq: Every day | TRANSDERMAL | 0 refills | Status: AC

## 2022-08-14 MED ORDER — TRAMADOL HCL 50 MG PO TABS: 50.0000 mg | ORAL_TABLET | Freq: Three times a day (TID) | ORAL | 0 refills | Status: DC | PRN

## 2022-08-14 NOTE — Progress Notes (Signed)
Patient remains stable, no complaints of pain. Patient walked in the hall several times throughout the night.  Seward Meth, RN

## 2022-08-14 NOTE — Discharge Summary (Signed)
Christian Kennedy, is a 56 y.o. male  DOB 1965-11-26  MRN [id number].  Admission date:  08/11/2022  Admitting Physician  Rolla Plate, DO  Discharge Date:  08/14/2022   Primary MD  Plotnikov, Evie Lacks, MD  Recommendations for primary care physician for things to follow:   1)Please follow-up with Urologist Dr. Alyson Ingles in about 10 to 12 days --- for recheck and follow-up evaluation  in his office----you will need repeat kidney ultrasound to see if the infection is better [facility], [address] [place] [address], [place] [address] Phone Number----[phone]  2) continue right flank nephrostomy tube care as recommended and advised by interventional radiology previously  3)Please keep your appointment with your urologist Dr. Tresa Moore to discuss possible bladder surgery for bladder cancer in January 2024  4) please keep your appointment with Dr. Delton Coombes the oncologist in January 2024 to discuss possible PET scan and further evaluation for bladder cancer  Admission Diagnosis  Renal abscess [N15.1] Sepsis, due to unspecified organism, unspecified whether acute organ dysfunction present [facility]) [A41.9]   Discharge Diagnosis  Renal abscess [N15.1] Sepsis, due to unspecified organism, unspecified whether acute organ dysfunction present [facility]) [A41.9]  ***  Principal Problem:   Renal abscess Active Problems:   Smoker   SIRS (systemic inflammatory response syndrome) ([place])   Insomnia   UTI (urinary tract infection)      Past Medical History:  Diagnosis Date   Abscess    Right buttocks   Bladder tumor    Depression    Son passed 2014   Ear infection    Eating disorder    Frequent headaches    GERD (gastroesophageal reflux disease)    Helicobacter pylori ab+ 10/27/2017   Hepatitis    childhood, jaundice   Migraines    Urinary incontinence    Varicose vein of leg    Right leg    Vitamin D deficiency     Past Surgical History:  Procedure Laterality Date   CYSTOSCOPY W/ URETERAL STENT PLACEMENT Right 11/19/2017   Procedure: CYSTOSCOPY WITH RETROGRADE PYELOGRAM/URETERAL STENT PLACEMENT;  Surgeon: Ceasar Mons, MD;  Location: [facility];  Service: Urology;  Laterality: Right;   CYSTOSCOPY WITH FULGERATION N/A 11/19/2017   Procedure: [place] AND CLOT EVACUATION;  Surgeon: Nickie Retort, MD;  Location: [facility];  Service: Urology;  Laterality: N/A;   IR NEPHROSTOMY EXCHANGE RIGHT  07/20/2022   IR NEPHROSTOMY PLACEMENT RIGHT  05/26/2022   TRANSURETHRAL RESECTION OF BLADDER TUMOR N/A 11/19/2017   Procedure: TRANSURETHRAL RESECTION OF BLADDER TUMOR (TURBT);  Surgeon: Ceasar Mons, MD;  Location: [facility];  Service: Urology;  Laterality: N/A;   TRANSURETHRAL RESECTION OF BLADDER TUMOR N/A 05/18/2022   Procedure: TRANSURETHRAL RESECTION OF BLADDER TUMOR (TURBT);  Surgeon: Cleon Gustin, MD;  Location: [facility];  Service: Urology;  Laterality: N/A;       HPI  from the history and physical done on the day of admission:     ***  ****  Hospital Course:     No notes on file  ***** Assessment and Plan: * Renal abscess - CT scan shows 2.3 cm left upper pole renal abscess progressive from prior CT - Urology consulted and recommends IR evaluation and starting Zosyn - Continue Zosyn - Currently n.p.o. for possible IR procedure - Hold any nephrotoxic agents when possible - Continue to monitor  UTI (urinary tract infection) - Complicated with right nephrostomy tube - Continue Zosyn  Insomnia - Continue Ambien  SIRS (systemic inflammatory response syndrome) ([place]) - Patient had reported fever, tachycardia 130, tachypnea 23, leukocytosis 12.2 - No endorgan damage - Continue Zosyn - Continue to monitor  Smoker --Smokes a pack per day - Continue nicotine  patch        Discharge Condition: ***  Follow UP     Consults obtained - ***  Diet and Activity recommendation:  As advised  Discharge Instructions    **** Discharge Instructions     Call MD for:  difficulty breathing, headache or visual disturbances   Complete by: As directed    Call MD for:  persistant dizziness or light-headedness   Complete by: As directed    Call MD for:  persistant nausea and vomiting   Complete by: As directed    Call MD for:  redness, tenderness, or signs of infection (pain, swelling, redness, odor or green/yellow discharge around incision site)   Complete by: As directed    Call MD for:  severe uncontrolled pain   Complete by: As directed    Call MD for:  temperature >100.4   Complete by: As directed    Diet - low sodium heart healthy   Complete by: As directed    Discharge instructions   Complete by: As directed    1)Please follow-up with Urologist Dr. Alyson Ingles in about 10 to 12 days --- for recheck and follow-up evaluation  in his office----you will need repeat kidney ultrasound to see if the infection is better [facility], [address], [place] [address], [place] [address] Phone Number----[phone]  2) continue right flank nephrostomy tube care as recommended and advised by interventional radiology previously  3)Please keep your appointment with your urologist Dr. Tresa Moore to discuss possible bladder surgery for bladder cancer in January 2024  4) please keep your appointment with Dr. Delton Coombes the oncologist in January 2024 to discuss possible PET scan and further evaluation for bladder cancer   Increase activity slowly   Complete by: As directed          Discharge Medications     Allergies as of 08/14/2022       Reactions   Macrobid [nitrofurantoin]    ?epig abd pain        Medication List     STOP taking these medications    GOODY HEADACHE PO   RABEprazole 20 MG tablet Commonly known as: ACIPHEX        TAKE these medications    b complex vitamins capsule Take 1 capsule by mouth daily.   B-12 PO Take 1 tablet by mouth daily.   BLACK CURRANT SEED OIL PO Take 1 capsule by mouth daily.   ciprofloxacin 500 MG tablet Commonly known as: Cipro Take 1 tablet (500 mg total) by mouth 2 (two) times daily for 10 days.   nicotine 14 mg/24hr patch Commonly known as: NICODERM CQ - dosed in mg/24 hours Place 1 patch (14 mg total) onto the skin daily.   sodium chloride flush 0.9 % Soln  Commonly known as: NS 10 ml syr use as directed   traMADol 50 MG tablet Commonly known as: ULTRAM Take 1 tablet (50 mg total) by mouth every 8 (eight) hours as needed for moderate pain.   Vitamin D (Ergocalciferol) 1.25 MG (50000 UNIT) Caps capsule Commonly known as: DRISDOL Take 1 capsule (50,000 Units total) by mouth every 7 (seven) days.   zolpidem 10 MG tablet Commonly known as: AMBIEN Take 0.5-1 tablets (5-10 mg total) by mouth at bedtime as needed for sleep.        Major procedures and Radiology Reports - PLEASE review detailed and final reports for all details, in brief -   ***  CT ABDOMEN PELVIS W CONTRAST  Result Date: 08/11/2022 CLINICAL DATA:  Right abdominal/flank pain, fever. Bladder cancer, status post right nephrostomy. EXAM: CT ABDOMEN AND PELVIS WITH CONTRAST TECHNIQUE: Multidetector CT imaging of the abdomen and pelvis was performed using the standard protocol following bolus administration of intravenous contrast. RADIATION DOSE REDUCTION: This exam was performed according to the departmental dose-optimization program which includes automated exposure control, adjustment of the mA and/or kV according to patient size and/or use of iterative reconstruction technique. CONTRAST:  127m OMNIPAQUE IOHEXOL 300 MG/ML  SOLN COMPARISON:  06/15/2022 FINDINGS: Lower chest: Mild left basilar atelectasis. Hepatobiliary: Liver is within normal limits. Layering gallbladder sludge (series  2/image 37), without associated inflammatory changes. No intrahepatic or extrahepatic ductal dilatation. Pancreas: Within normal limits. Spleen: Within normal limits. Adrenals/Urinary Tract: Adrenal glands are within normal limits. 2.3 cm vague fluid density lesion with overlying inflammatory changes in the left upper kidney (series 2/image 32), previously measuring 10 mm, likely reflecting a renal abscess given the rapid progression. Right kidney is notable for a percutaneous nephrostomy in satisfactory position. No hydronephrosis. Mild wall thickening/urothelial enhancement involving the distal right ureter (series 2/image 78) with asymmetric wall thickening involving the right posterolateral bladder (series 2/image 81), possibly related to the patient's known urothelial carcinoma. Stomach/Bowel: Stomach is within normal limits. No evidence of bowel obstruction. Normal appendix (series 2/image 89). No colonic wall thickening or inflammatory changes. Vascular/Lymphatic: No evidence of abdominal aortic aneurysm. Atherosclerotic calcifications of the abdominal aorta and branch vessels. No suspicious abdominopelvic lymphadenopathy. Reproductive: Mild prostatomegaly. Other: No abdominopelvic ascites. Musculoskeletal: Degenerative changes of the visualized thoracolumbar spine. IMPRESSION: Suspected 2.3 cm left upper pole renal abscess, progressive from prior CT, with associated perinephric stranding. Asymmetric wall thickening involving the right posterolateral bladder and distal right ureter, possibly related to the patient's known urothelial carcinoma. Right percutaneous nephrostomy in satisfactory position. No hydronephrosis. Additional ancillary findings as above. Electronically Signed   By: SJulian HyM.D.   On: 08/11/2022 22:33   DG Chest 2 View  Result Date: 08/11/2022 CLINICAL DATA:  Right flank pain and fever since last night. EXAM: CHEST - 2 VIEW COMPARISON:  Chest, abdomen and pelvis CT dated  06/15/2022 FINDINGS: Normal sized heart. Clear lungs. Possible small amount of posterior pleural fluid on the lateral view. Mild-to-moderate peribronchial thickening. Thoracic spine degenerative changes. IMPRESSION: 1. Possible small amount of posterior pleural fluid on the lateral view. 2. Mild to moderate bronchitic changes. Electronically Signed   By: SClaudie ReveringM.D.   On: 08/11/2022 19:25   IR NEPHROSTOMY EXCHANGE RIGHT  Result Date: 07/20/2022 INDICATION: Routine exchange.  Obstructive bladder tumor. EXAM: FLUOROSCOPIC GUIDED RIGHT SIDED NEPHROSTOMY CATHETER EXCHANGE COMPARISON:  IR fluoroscopy, 05/26/2022.  CT [facility], 06/15/2022. CONTRAST:  Ten mL Isovue-300 administered into the collecting system FLUOROSCOPY TIME:  Fluoroscopic dose; 1  mGy COMPLICATIONS: None immediate. TECHNIQUE: Informed written consent was obtained from the the patient and/or patient's representative after a discussion of the risks, benefits and alternatives to treatment. Questions regarding the procedure were encouraged and answered. A timeout was performed prior to the initiation of the procedure. The RIGHT flank and external portion of existing nephrostomy catheter were prepped and draped in the usual sterile fashion. A sterile drape was applied covering the operative field. Maximum barrier sterile technique with sterile gowns and gloves were used for the procedure. A timeout was performed prior to the initiation of the procedure. A pre procedural spot fluoroscopic image was obtained after contrast was injected via the existing nephrostomy catheter demonstrating appropriate positioning within the renal pelvis. The existing nephrostomy catheter was cut and cannulated with a Benson wire which was coiled within the renal pelvis. Under intermittent fluoroscopic guidance, the existing nephrostomy catheter was exchanged for a new 10 Fr catheter. Contrast injection confirmed appropriate positioning within the renal pelvis and a post  exchange fluoroscopic image was obtained. The catheter was secured to the skin with a StatLock. A dressing was placed. The patient tolerated the procedure well without immediate postprocedural complication. FINDINGS: The existing nephrostomy catheter is appropriately positioned and functioning. After successful fluoroscopic guided exchange, the new nephrostomy catheter is coiled and locked within the RIGHT renal pelvis. IMPRESSION: Successful exchange of RIGHT sided 10 Fr percutaneous nephrostomy catheter, as above. The patient will return to V[facility] Inter[facility]iology (VIR) for routine drainage catheter evaluation and exchange in 8 weeks. Michaelle Birks, MD [facility] Rad[facility] Electronically Signed   By: Michaelle Birks M.D.   On: 07/20/2022 13:47    Micro Results   *** Recent Results (from the past 240 hour(s))  Resp Panel by RT-PCR (Flu A&B, Covid) Urine, Clean Catch     Status: None   Collection Time: 08/11/22  8:01 PM   Specimen: Urine, Clean Catch; Nasal Swab  Result Value Ref Range Status   SARS Coronavirus 2 by RT PCR NEGATIVE NEGATIVE Final    Comment: (NOTE) SARS-CoV-2 target nucleic acids are NOT DETECTED.  The SARS-CoV-2 RNA is generally detectable in upper respiratory specimens during the acute phase of infection. The lowest concentration of SARS-CoV-2 viral copies this assay can detect is 138 copies/mL. A negative result does not preclude SARS-Cov-2 infection and should not be used as the sole basis for treatment or other patient management decisions. A negative result may occur with  improper specimen collection/handling, submission of specimen other than nasopharyngeal swab, presence of viral mutation(s) within the areas targeted by this assay, and inadequate number of viral copies(<138 copies/mL). A negative result must be combined with clinical observations, patient history, and epidemiological information. The expected  result is Negative.  Fact Sheet for Patients:  EntrepreneurPulse.com.au  Fact Sheet for Healthcare Providers:  IncredibleEmployment.be  This test is no t yet approved or cleared by the [place] FDA and  has been authorized for detection and/or diagnosis of SARS-CoV-2 by FDA under an Emergency Use Authorization (EUA). This EUA will remain  in effect (meaning this test can be used) for the duration of the COVID-19 declaration under Section 564(b)(1) of the Act, 21 U.S.C.section 360bbb-3(b)(1), unless the authorization is terminated  or revoked sooner.       Influenza A by PCR NEGATIVE NEGATIVE Final   Influenza B by PCR NEGATIVE NEGATIVE Final    Comment: (NOTE) The Xpert Xpress SARS-CoV-2/FLU/RSV plus assay is intended as an aid in the diagnosis  of influenza from Nasopharyngeal swab specimens and should not be used as a sole basis for treatment. Nasal washings and aspirates are unacceptable for Xpert Xpress SARS-CoV-2/FLU/RSV testing.  Fact Sheet for Patients: EntrepreneurPulse.com.au  Fact Sheet for Healthcare Providers: IncredibleEmployment.be  This test is not yet approved or cleared by the [place] FDA and has been authorized for detection and/or diagnosis of SARS-CoV-2 by FDA under an Emergency Use Authorization (EUA). This EUA will remain in effect (meaning this test can be used) for the duration of the COVID-19 declaration under Section 564(b)(1) of the Act, 21 U.S.C. section 360bbb-3(b)(1), unless the authorization is terminated or revoked.  Performed at [facility], [address], [place], [place] [address]   Blood culture (routine x 2)     Status: None (Preliminary result)   Collection Time: 08/11/22  8:34 PM   Specimen: Left Antecubital; Blood  Result Value Ref Range Status   Specimen Description LEFT ANTECUBITAL  Final   Special Requests   Final    BOTTLES DRAWN AEROBIC AND  ANAEROBIC Blood Culture adequate volume   Culture   Final    NO GROWTH 3 DAYS Performed at [facility], [address] [place], [place] [address]    Report Status PENDING  Incomplete  Blood culture (routine x 2)     Status: None (Preliminary result)   Collection Time: 08/11/22  8:34 PM   Specimen: BLOOD LEFT HAND  Result Value Ref Range Status   Specimen Description BLOOD LEFT HAND  Final   Special Requests   Final    BOTTLES DRAWN AEROBIC AND ANAEROBIC Blood Culture adequate volume   Culture   Final    NO GROWTH 3 DAYS Performed at [facility], [address], [place], [place] [address]    Report Status PENDING  Incomplete  Urine Culture     Status: None   Collection Time: 08/11/22  9:26 PM   Specimen: Urine, Clean Catch  Result Value Ref Range Status   Specimen Description   Final    URINE, CLEAN CATCH Performed at [facility], [address] [place], [place] [address]    Special Requests   Final    NONE Performed at [facility], [address] [place], [place] [address]    Culture   Final    NO GROWTH Performed at [facility], [place] [address], [place], [place] [address]    Report Status 08/13/2022 FINAL  Final    Today   Subjective    Christian Kennedy today has no ***          Patient has been seen and examined prior to discharge   Objective   Blood pressure (!) 99/56, pulse 70, temperature 97.6 F (36.4 C), temperature source Oral, resp. rate 18, height '6\' 3"'$  (1.905 m), weight 99.6 kg, SpO2 100 %.   Intake/Output Summary (Last 24 hours) at 08/14/2022 1651 Last data filed at 08/14/2022 1300 Gross per 24 hour  Intake 600 ml  Output --  Net 600 ml    Exam Gen:- Awake Alert, no acute distress *** HEENT:- .AT, No sclera icterus Neck-Supple Neck,No JVD,.  Lungs-  CTAB , good air movement bilaterally CV- S1, S2 normal, regular Abd-  +ve B.Sounds, Abd Soft, No tenderness,    Extremity/Skin:- No  edema,   good pulses Psych-affect is appropriate,  oriented x3 Neuro-no new focal deficits, no tremors ***   Data Review   CBC w Diff:  Lab Results  Component Value Date  WBC 7.8 08/13/2022   HGB 12.6 (L) 08/13/2022   HCT 38.4 (L) 08/13/2022   PLT 167 08/13/2022   LYMPHOPCT 23 08/12/2022   MONOPCT 12 08/12/2022   EOSPCT 1 08/12/2022   BASOPCT 0 08/12/2022    CMP:  Lab Results  Component Value Date   NA 138 08/13/2022   NA 138 05/22/2022   K 4.0 08/13/2022   CL 104 08/13/2022   CO2 27 08/13/2022   BUN 16 08/13/2022   BUN 17 05/22/2022   CREATININE 1.21 08/13/2022   PROT 6.3 (L) 08/12/2022   ALBUMIN 3.5 08/12/2022   BILITOT 1.1 08/12/2022   ALKPHOS 45 08/12/2022   AST 11 (L) 08/12/2022   ALT 13 08/12/2022  .  Total Discharge time is about 33 minutes  Roxan Hockey M.D on 08/14/2022 at 4:51 PM  Go to [email or web address] -  for contact info  [facility] - Office  [phone]

## 2022-08-14 NOTE — Discharge Instructions (Signed)
1)Please follow-up with Urologist Dr. Alyson Ingles in about 10 to 12 days --- for recheck and follow-up evaluation  in his office----you will need repeat kidney ultrasound to see if the infection is better [facility], [address] [place] [address], [place] [address] Phone Number----[phone]  2) continue right flank nephrostomy tube care as recommended and advised by interventional radiology previously  3)Please keep your appointment with your urologist Dr. Tresa Moore to discuss possible bladder surgery for bladder cancer in January 2024  4) please keep your appointment with Dr. Delton Coombes the oncologist in January 2024 to discuss possible PET scan and further evaluation for bladder cancer

## 2022-08-14 NOTE — Plan of Care (Signed)

## 2022-08-16 LAB — CULTURE, BLOOD (ROUTINE X 2)
Culture: NO GROWTH
Culture: NO GROWTH
Special Requests: ADEQUATE
Special Requests: ADEQUATE

## 2022-09-03 ENCOUNTER — Encounter: Payer: Self-pay | Admitting: Emergency Medicine

## 2022-09-03 ENCOUNTER — Ambulatory Visit
Admission: EM | Admit: 2022-09-03 | Discharge: 2022-09-03 | Disposition: A | Discharge status: Home / Self Care | Payer: 59 | Attending: Nurse Practitioner | Admitting: Nurse Practitioner

## 2022-09-03 DIAGNOSIS — Z23 Encounter for immunization: Secondary | ICD-10-CM | POA: Diagnosis not present

## 2022-09-03 DIAGNOSIS — L03114 Cellulitis of left upper limb: Secondary | ICD-10-CM | POA: Diagnosis not present

## 2022-09-03 DIAGNOSIS — R21 Rash and other nonspecific skin eruption: Secondary | ICD-10-CM | POA: Diagnosis not present

## 2022-09-03 MED ORDER — SULFAMETHOXAZOLE-TRIMETHOPRIM 800-160 MG PO TABS: 1.0000 | ORAL_TABLET | Freq: Two times a day (BID) | ORAL | 0 refills | Status: AC

## 2022-09-03 MED ORDER — TETANUS-DIPHTH-ACELL PERTUSSIS 5-2.5-18.5 LF-MCG/0.5 IM SUSY
0.5000 mL | PREFILLED_SYRINGE | Freq: Once | INTRAMUSCULAR | Status: AC
  Administered 2022-09-03: 0.5 mL via INTRAMUSCULAR

## 2022-09-03 NOTE — Discharge Instructions (Addendum)
Start the Bactrim DS twice daily for 7 days to treat the infection in your hand.  We have also given you a tetanus shot today.  If the redness is not improved after 48 hours of antibiotics or if you develop fever, chills, body aches, nausea/vomiting, go to the ER.  You can use hydrocortisone on the rash and see if it improves, follow up with PCP with no improvement in symptoms.

## 2022-09-03 NOTE — ED Triage Notes (Signed)
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  Swollen red area to left hand.  Also c/o rash on stomach since Sunday.  Has been taking benadryl for rash.  Thinks he may have rash from a herbal tea.

## 2022-09-03 NOTE — ED Provider Notes (Signed)
[facility]    CSN: [id number] Arrival date & time: 09/03/22  1019      History   Chief Complaint No chief complaint on file.   HPI Christian Kennedy is a 56 y.o. male.   Patient presents today for left hand pain and swelling in between his thumb and first finger that has been ongoing for the past week.  Reports he was working on his truck when a plastic zip tie cut his hand on accident.  Reports the area has turned red and looks like he has a pus pocket.  He has tried to wash it and squeeze the area without much benefit.   Also has a red and not itchy rash on his trunk that has been present for the past few days.  Reports he started drinking a herbal tea to treat bladder tumor and his wife thinks the rash is from the tea.  The rash is not itchy.  It does appear to be spreading across his trunk.  No active drainage or swelling around the rash.    Past Medical History:  Diagnosis Date   Abscess    Right buttocks   Bladder tumor    Depression    Son passed 2014   Ear infection    Eating disorder    Frequent headaches    GERD (gastroesophageal reflux disease)    Helicobacter pylori ab+ 10/27/2017   Hepatitis    childhood, jaundice   Migraines    Urinary incontinence    Varicose vein of leg    Right leg   Vitamin D deficiency     Patient Active Problem List   Diagnosis Date Noted   Renal abscess 08/12/2022   SIRS (systemic inflammatory response syndrome) ([place]) 08/12/2022   Insomnia 08/12/2022   UTI (urinary tract infection) 08/12/2022   Weight loss 05/06/2022   Smoker 03/22/2022   Abdominal pain, lower 03/22/2022   Concussion 11/13/2021   PTSD (post-traumatic stress disorder) 11/13/2021   Head injuries 11/03/2021   Tinnitus, left 05/16/2021   Vertigo 05/16/2021   Headache 05/16/2021   With resident's patient, thank you 05/16/2021   Chest pain, atypical 09/16/2018   Urothelial cancer ([place]) 70/35/0093   Helicobacter pylori ab+ 10/27/2017   Bladder  tumor 10/26/2017   Hypogonadism male 09/16/2017   GERD (gastroesophageal reflux disease) 09/16/2017   Grief at loss of child 09/16/2017   Vitamin D deficiency 09/16/2017   Well adult exam 09/15/2017   Dysuria 09/15/2017   Migraines 09/15/2017   Fatigue 09/15/2017   RUQ pain 09/15/2017    Past Surgical History:  Procedure Laterality Date   CYSTOSCOPY W/ URETERAL STENT PLACEMENT Right 11/19/2017   Procedure: CYSTOSCOPY WITH RETROGRADE PYELOGRAM/URETERAL STENT PLACEMENT;  Surgeon: Ceasar Mons, MD;  Location: [facility];  Service: Urology;  Laterality: Right;   CYSTOSCOPY WITH FULGERATION N/A 11/19/2017   Procedure: [place] AND CLOT EVACUATION;  Surgeon: Nickie Retort, MD;  Location: [facility];  Service: Urology;  Laterality: N/A;   IR NEPHROSTOMY EXCHANGE RIGHT  07/20/2022   IR NEPHROSTOMY PLACEMENT RIGHT  05/26/2022   TRANSURETHRAL RESECTION OF BLADDER TUMOR N/A 11/19/2017   Procedure: TRANSURETHRAL RESECTION OF BLADDER TUMOR (TURBT);  Surgeon: Ceasar Mons, MD;  Location: [facility];  Service: Urology;  Laterality: N/A;   TRANSURETHRAL RESECTION OF BLADDER TUMOR N/A 05/18/2022   Procedure: TRANSURETHRAL RESECTION OF BLADDER TUMOR (TURBT);  Surgeon: Cleon Gustin, MD;  Location: [facility];  Service: Urology;  Laterality: N/A;       Home Medications    Prior to Admission medications   Medication Sig Start Date End Date Taking? Authorizing Provider  sulfamethoxazole-trimethoprim (BACTRIM DS) 800-160 MG tablet Take 1 tablet by mouth 2 (two) times daily for 7 days. 09/03/22 09/10/22 Yes Noemi Chapel A, NP  b complex vitamins capsule Take 1 capsule by mouth daily.    [provider]  BLACK CURRANT SEED OIL PO Take 1 capsule by mouth daily.    [provider]  Cyanocobalamin (B-12 PO) Take 1 tablet by mouth daily.    [provider]  nicotine (NICODERM CQ - DOSED IN MG/24 HOURS)  14 mg/24hr patch Place 1 patch (14 mg total) onto the skin daily. 08/14/22 08/14/23  Roxan Hockey, MD  sodium chloride flush (NS) 0.9 % SOLN 10 ml syr use as directed 07/07/22   Plotnikov, Evie Lacks, MD  traMADol (ULTRAM) 50 MG tablet Take 1 tablet (50 mg total) by mouth every 8 (eight) hours as needed for moderate pain. 08/14/22   Roxan Hockey, MD  Vitamin D, Ergocalciferol, (DRISDOL) 1.25 MG (50000 UNIT) CAPS capsule Take 1 capsule (50,000 Units total) by mouth every 7 (seven) days. 07/07/22   Plotnikov, Evie Lacks, MD  zolpidem (AMBIEN) 10 MG tablet Take 0.5-1 tablets (5-10 mg total) by mouth at bedtime as needed for sleep. 07/07/22   Plotnikov, Evie Lacks, MD    Family History Family History  Problem Relation Age of Onset   Cancer Mother    Depression Mother    Arthritis Father    Depression Father    Hearing loss Father    Depression Daughter    Early death Son     Social History Social History   Tobacco Use   Smoking status: Every Day    Packs/day: 1.00    Years: 30.00    Total pack years: 30.00    Types: Cigarettes   Smokeless tobacco: Never  Vaping Use   Vaping Use: Never used  Substance Use Topics   Alcohol use: Yes    Comment: rare   Drug use: No     Allergies   Macrobid [nitrofurantoin]   Review of Systems Review of Systems Per HPI  Physical Exam Triage Vital Signs ED Triage Vitals  Enc Vitals Group     BP 09/03/22 1026 112/74     Pulse Rate 09/03/22 1026 (!) 101     Resp 09/03/22 1026 18     Temp 09/03/22 1026 98.1 F (36.7 C)     Temp Source 09/03/22 1026 Oral     SpO2 09/03/22 1026 97 %     Weight --      Height --      Head Circumference --      Peak Flow --      Pain Score 09/03/22 1027 8     Pain Loc --      Pain Edu? --      Excl. in [place]? --    No data found.  Updated Vital Signs BP 112/74 (BP Location: Left Arm)   Pulse (!) 101   Temp 98.1 F (36.7 C) (Oral)   Resp 18   SpO2 97%   Visual Acuity Right Eye Distance:    Left Eye Distance:   Bilateral Distance:    Right Eye Near:   Left Eye Near:    Bilateral Near:     Physical Exam Constitutional:      General: He is not in  acute distress.    Appearance: Normal appearance. He is not toxic-appearing.  HENT:     Mouth/Throat:     Mouth: Mucous membranes are moist.     Pharynx: Oropharynx is clear.  Pulmonary:     Effort: Pulmonary effort is normal. No respiratory distress.  Musculoskeletal:       Hands:     Comments: Erythematous, indurated and firm abscess to left hand and approximately area marked.  There are 2 pustules within the abscess.  It is tender to palpation.  No active drainage.  Skin:    General: Skin is warm and dry.     Capillary Refill: Capillary refill takes less than 2 seconds.     Findings: Erythema and rash present.          Comments: Generalized erythematous, pustular, maculopapular rash to trunk in approximately area marked.  No surrounding erythema, warmth, active drainage.  Neurological:     Mental Status: He is alert and oriented to person, place, and time.  Psychiatric:        Behavior: Behavior is cooperative.      UC Treatments / Results  Labs (all labs ordered are listed, but only abnormal results are displayed) Labs Reviewed - No data to display  EKG   Radiology No results found.  Procedures Procedures (including critical care time)  Medications Ordered in UC Medications  Tdap (BOOSTRIX) injection 0.5 mL (0.5 mLs Intramuscular Given 09/03/22 1101)    Initial Impression / Assessment and Plan / UC Course  I have reviewed the triage vital signs and the nursing notes.  Pertinent labs & imaging results that were available during my care of the patient were reviewed by me and considered in my medical decision making (see chart for details).   Patient is well-appearing, normotensive, afebrile, not tachypneic, oxygenating well on room air. He is mildly tachycardic today in triage.    Cellulitis of  left hand Treat with Bactrim DS twice daily for 7 days Tdap updated I&D not indicated today as area is hard and indurated Strict ER precautions discussed if symptoms worsen  Rash and nonspecific skin eruption Unclear etiology Discussed with patient I doubt this is secondary to the herbal tea Supportive care discussed including hydrocortisone cream Follow-up with PCP if symptoms persist worsen spite treatment  The patient was given the opportunity to ask questions.  All questions answered to their satisfaction.  The patient is in agreement to this plan.    Final Clinical Impressions(s) / UC Diagnoses   Final diagnoses:  Cellulitis of left hand  Rash and nonspecific skin eruption     Discharge Instructions      Start the Bactrim DS twice daily for 7 days to treat the infection in your hand.  We have also given you a tetanus shot today.  If the redness is not improved after 48 hours of antibiotics or if you develop fever, chills, body aches, nausea/vomiting, go to the ER.  You can use hydrocortisone on the rash and see if it improves, follow up with PCP with no improvement in symptoms.       ED Prescriptions     Medication Sig Dispense Auth. Provider   sulfamethoxazole-trimethoprim (BACTRIM DS) 800-160 MG tablet Take 1 tablet by mouth 2 (two) times daily for 7 days. 14 tablet Eulogio Bear, NP      PDMP not reviewed this encounter.   Eulogio Bear, NP 09/03/22 1840

## 2022-09-05 ENCOUNTER — Emergency Department (HOSPITAL_COMMUNITY)
Admission: EM | Admit: 2022-09-05 | Discharge: 2022-09-05 | Disposition: A | Discharge status: Home / Self Care | Payer: 59 | Attending: Emergency Medicine | Admitting: Emergency Medicine

## 2022-09-05 ENCOUNTER — Encounter (HOSPITAL_COMMUNITY): Payer: Self-pay | Admitting: *Deleted

## 2022-09-05 ENCOUNTER — Other Ambulatory Visit: Payer: Self-pay

## 2022-09-05 DIAGNOSIS — L02414 Cutaneous abscess of left upper limb: Secondary | ICD-10-CM | POA: Insufficient documentation

## 2022-09-05 DIAGNOSIS — L02512 Cutaneous abscess of left hand: Secondary | ICD-10-CM | POA: Diagnosis not present

## 2022-09-05 DIAGNOSIS — M7989 Other specified soft tissue disorders: Secondary | ICD-10-CM | POA: Diagnosis not present

## 2022-09-05 MED ORDER — LIDOCAINE HCL (PF) 2 % IJ SOLN
INTRAMUSCULAR | Status: AC
  Filled 2022-09-05: qty 20

## 2022-09-05 MED ORDER — LIDOCAINE HCL (PF) 2 % IJ SOLN
2.0000 mL | Freq: Once | INTRAMUSCULAR | Status: AC
  Administered 2022-09-05: 2 mL

## 2022-09-05 MED ORDER — DOXYCYCLINE HYCLATE 100 MG PO TABS
100.0000 mg | ORAL_TABLET | Freq: Once | ORAL | Status: AC
  Administered 2022-09-05: 100 mg via ORAL
  Filled 2022-09-05: qty 1

## 2022-09-05 MED ORDER — DOXYCYCLINE HYCLATE 100 MG PO CAPS: 100.0000 mg | ORAL_CAPSULE | Freq: Two times a day (BID) | ORAL | 0 refills | Status: DC

## 2022-09-05 NOTE — ED Triage Notes (Signed)
Pt with scratch to left hand at work over a week ago from a zip tie. Pt here for wound check. Pt seen at UC the other day and was started on an antibiotic. If worse in 48 hours, instructed to go to ER.

## 2022-09-05 NOTE — ED Provider Notes (Signed)
[facility] EMERGENCY DEPARTMENT Provider Note   CSN: [id number] Arrival date & time: 09/05/22  2017     History  Chief Complaint  Patient presents with   Hand Injury    Christian Kennedy is a 56 y.o. male presenting today with an abscess to the left hand.  Reports that he thinks something bit him.  He now has a couple of erythematous papules on his trunk as well.  Concern for spider bite.  He saw his PCP who put him on Bactrim and said no drainage was necessary.  He was told to follow-up if things are not getting better and currently he feels as though the area is getting larger and more tender.  No decreased range of motion or sensation in his hand.   Hand Injury      Home Medications Prior to Admission medications   Medication Sig Start Date End Date Taking? Authorizing Provider  doxycycline (VIBRAMYCIN) 100 MG capsule Take 1 capsule (100 mg total) by mouth 2 (two) times daily. 09/05/22  Yes Jonatan Wilsey A, PA-C  b complex vitamins capsule Take 1 capsule by mouth daily.    [provider]  BLACK CURRANT SEED OIL PO Take 1 capsule by mouth daily.    [provider]  Cyanocobalamin (B-12 PO) Take 1 tablet by mouth daily.    [provider]  nicotine (NICODERM CQ - DOSED IN MG/24 HOURS) 14 mg/24hr patch Place 1 patch (14 mg total) onto the skin daily. 08/14/22 08/14/23  Roxan Hockey, MD  sodium chloride flush (NS) 0.9 % SOLN 10 ml syr use as directed 07/07/22   Plotnikov, Evie Lacks, MD  sulfamethoxazole-trimethoprim (BACTRIM DS) 800-160 MG tablet Take 1 tablet by mouth 2 (two) times daily for 7 days. 09/03/22 09/10/22  Eulogio Bear, NP  traMADol (ULTRAM) 50 MG tablet Take 1 tablet (50 mg total) by mouth every 8 (eight) hours as needed for moderate pain. 08/14/22   Roxan Hockey, MD  Vitamin D, Ergocalciferol, (DRISDOL) 1.25 MG (50000 UNIT) CAPS capsule Take 1 capsule (50,000 Units total) by mouth every 7 (seven) days. 07/07/22   Plotnikov,  Evie Lacks, MD  zolpidem (AMBIEN) 10 MG tablet Take 0.5-1 tablets (5-10 mg total) by mouth at bedtime as needed for sleep. 07/07/22   Plotnikov, Evie Lacks, MD      Allergies    Macrobid [nitrofurantoin]    Review of Systems   Review of Systems  Physical Exam Updated Vital Signs BP 134/80 (BP Location: Left Arm)   Pulse 90   Temp 97.9 F (36.6 C) (Oral)   Resp 18   Ht '6\' 3"'$  (1.905 m)   Wt 99.8 kg   SpO2 99%   BMI 27.50 kg/m  Physical Exam Vitals and nursing note reviewed.  Constitutional:      Appearance: Normal appearance.  HENT:     Head: Normocephalic and atraumatic.  Eyes:     General: No scleral icterus.    Conjunctiva/sclera: Conjunctivae normal.  Pulmonary:     Effort: Pulmonary effort is normal. No respiratory distress.  Musculoskeletal:     Comments: Full range of motion of all digits.  No difficulty with thumb opposition.  Normal cap refill throughout.  This applies to original exam and reexamination after incision and drainage  Skin:    Findings: No rash.  Neurological:     Mental Status: He is alert.  Psychiatric:        Mood and Affect: Mood normal.  Document Information ED Results / Procedures / Treatments   Labs (all labs ordered are listed, but only abnormal results are displayed) Labs Reviewed  AEROBIC CULTURE W GRAM STAIN (SUPERFICIAL SPECIMEN)    EKG None  Radiology No results found.  Procedures .Marland KitchenIncision and Drainage  Date/Time: 09/05/2022 10:29 PM  Performed by: Rhae Hammock, PA-C Authorized by: Rhae Hammock, PA-C   Consent:    Consent obtained:  Verbal   Consent given by:  Patient   Risks discussed:  Bleeding, incomplete drainage, infection and pain Universal protocol:    Patient identity confirmed:  Verbally with patient Location:    Type:  Abscess   Size:  2cm   Location:  Upper extremity   Upper extremity location:  Hand   Hand location:  L hand Pre-procedure details:    Skin preparation:   Povidone-iodine Sedation:    Sedation type:  None Anesthesia:    Anesthesia method:  Local infiltration   Local anesthetic:  Lidocaine 2% w/o epi Procedure type:    Complexity:  Simple Procedure details:    Incision types:  Single straight   Wound management:  Probed and deloculated, irrigated with saline and extensive cleaning   Drainage:  Purulent and serosanguinous   Drainage amount:  Copious   Packing materials:  None Post-procedure details:    Procedure completion:  Tolerated well, no immediate complications    Medications Ordered in ED Medications  lidocaine HCl (PF) (XYLOCAINE) 2 % injection 2 mL (2 mLs Infiltration Given 09/05/22 2130)  lidocaine HCl (PF) (XYLOCAINE) 2 % injection (  Given 09/05/22 2131)  doxycycline (VIBRA-TABS) tablet 100 mg (100 mg Oral Given 09/05/22 2137)    ED Course/ Medical Decision Making/ A&P                           Medical Decision Making Risk Prescription drug management.   56 year old male presenting with an abscess to the hand.  Also has some papules to the trunk.  Question reaction from insect bite.  He was seen by urgent care who did not believe that any I&D was indicated and he was started on Bactrim.  He returns today with more inflammation and tenderness.   Has normal range of motion of all digits.  Currently no concern for tendon or nerve damage, neurovascularly intact.  Low suspicion flexor tenosynovitis at this time.  Incision and drainage pros and cons were discussed with the patient and we decided to proceed.  Copious amount of purulent matter was drained from the abscess.  Patient remained neurovascularly intact after the procedure.  He tolerated this very well.  Doxycycline will be prescribed which will cover better for any insect bite as well.  He has no systemic symptoms that warrant admission or further evaluation in the emergency department at this time.  He was instructed to get his wound checked in 3 days by PCP or an urgent  care.  He and his wife are agreeable to this.  All of this was written on her discharge papers and they were discharged home  Final Clinical Impression(s) / ED Diagnoses Final diagnoses:  Abscess of hand, left    Rx / DC Orders ED Discharge Orders          Ordered    doxycycline (VIBRAMYCIN) 100 MG capsule  2 times daily        09/05/22 2134           Results and diagnoses  were explained to the patient. Return precautions discussed in full. Patient had no additional questions and expressed complete understanding.   This chart was dictated using voice recognition software.  Despite best efforts to proofread,  errors can occur which can change the documentation meaning.     Rhae Hammock, PA-C 09/05/22 2241    Hayden Rasmussen, MD 09/06/22 1028

## 2022-09-05 NOTE — ED Notes (Signed)
Wound to left hand cleanse with NS, xeroform and gauze applied,

## 2022-09-05 NOTE — Discharge Instructions (Addendum)
You came to the emergency department with an abscess to your left hand.  It was drained and you are being started on a new antibiotic that you should take with food to avoid stomach upset.  I would like you to see your PCP next week for reevaluation.  Tylenol and ibuprofen are good options for your pain.  Do not hesitate to return to the emergency department with any concerns or worsening symptoms.  It was a pleasure to meet you and we hope you feel better!

## 2022-09-08 LAB — AEROBIC CULTURE W GRAM STAIN (SUPERFICIAL SPECIMEN)

## 2022-09-09 ENCOUNTER — Telehealth (HOSPITAL_BASED_OUTPATIENT_CLINIC_OR_DEPARTMENT_OTHER): Payer: Self-pay | Admitting: *Deleted

## 2022-09-09 NOTE — Telephone Encounter (Signed)
Post ED Visit - Positive Culture Follow-up  Culture report reviewed by antimicrobial stewardship pharmacist: [facility] Team '[]'$  Elenor Quinones, Pharm.D. '[]'$  Heide Guile, Pharm.D., BCPS AQ-ID '[]'$  Parks Neptune, Pharm.D., BCPS '[]'$  Alycia Rossetti, Pharm.D., BCPS '[]'$  [place], [place].D., BCPS, AAHIVP '[]'$  Legrand Como, Pharm.D., BCPS, AAHIVP '[]'$  Salome Arnt, PharmD, BCPS '[]'$  Johnnette Gourd, PharmD, BCPS '[]'$  Hughes Better, PharmD, BCPS '[]'$  Leeroy Cha, PharmD '[]'$  Laqueta Linden, PharmD, BCPS '[]'$  Albertina Parr, PharmD  [facility] Team '[]'$  Leodis Sias, PharmD '[]'$  Lindell Spar, PharmD '[]'$  Royetta Asal, PharmD '[]'$  Graylin Shiver, Rph '[]'$  Rema Fendt) Glennon Mac, PharmD '[]'$  Arlyn Dunning, PharmD '[]'$  Netta Cedars, PharmD '[]'$  Dia Sitter, PharmD '[]'$  Leone Haven, PharmD '[]'$  Gretta Arab, PharmD '[]'$  Theodis Shove, PharmD '[]'$  Peggyann Juba, PharmD '[]'$  Reuel Boom, PharmD   Positive wound culture Treated with Doxycycline, organism sensitive to the same and no further patient follow-up is required at this time.  Bertis Ruddy, PharmD  Harlon Flor [place] 09/09/2022, 8:21 AM

## 2022-09-25 ENCOUNTER — Other Ambulatory Visit (HOSPITAL_COMMUNITY): Payer: Self-pay | Admitting: Interventional Radiology

## 2022-09-25 DIAGNOSIS — D494 Neoplasm of unspecified behavior of bladder: Secondary | ICD-10-CM

## 2022-09-30 ENCOUNTER — Inpatient Hospital Stay: Payer: 59 | Attending: Hematology

## 2022-09-30 ENCOUNTER — Ambulatory Visit (HOSPITAL_COMMUNITY): Payer: 59

## 2022-09-30 ENCOUNTER — Emergency Department (HOSPITAL_COMMUNITY): Payer: 59

## 2022-09-30 ENCOUNTER — Other Ambulatory Visit: Payer: Self-pay

## 2022-09-30 ENCOUNTER — Emergency Department (HOSPITAL_COMMUNITY)
Admission: EM | Admit: 2022-09-30 | Discharge: 2022-10-01 | Disposition: A | Discharge status: Home / Self Care | Payer: 59 | Attending: Emergency Medicine | Admitting: Emergency Medicine

## 2022-09-30 ENCOUNTER — Encounter (HOSPITAL_COMMUNITY): Payer: Self-pay

## 2022-09-30 DIAGNOSIS — C7989 Secondary malignant neoplasm of other specified sites: Secondary | ICD-10-CM | POA: Insufficient documentation

## 2022-09-30 DIAGNOSIS — F17210 Nicotine dependence, cigarettes, uncomplicated: Secondary | ICD-10-CM | POA: Insufficient documentation

## 2022-09-30 DIAGNOSIS — R109 Unspecified abdominal pain: Secondary | ICD-10-CM | POA: Insufficient documentation

## 2022-09-30 DIAGNOSIS — Z960 Presence of urogenital implants: Secondary | ICD-10-CM | POA: Diagnosis not present

## 2022-09-30 DIAGNOSIS — C689 Malignant neoplasm of urinary organ, unspecified: Secondary | ICD-10-CM

## 2022-09-30 DIAGNOSIS — Z8551 Personal history of malignant neoplasm of bladder: Secondary | ICD-10-CM | POA: Insufficient documentation

## 2022-09-30 DIAGNOSIS — C679 Malignant neoplasm of bladder, unspecified: Secondary | ICD-10-CM | POA: Insufficient documentation

## 2022-09-30 LAB — COMPREHENSIVE METABOLIC PANEL
ALT: 13 U/L (ref 0–44)
AST: 14 U/L — ABNORMAL LOW (ref 15–41)
Albumin: 4.5 g/dL (ref 3.5–5.0)
Alkaline Phosphatase: 55 U/L (ref 38–126)
Anion gap: 10 (ref 5–15)
BUN: 19 mg/dL (ref 6–20)
CO2: 26 mmol/L (ref 22–32)
Calcium: 9.4 mg/dL (ref 8.9–10.3)
Chloride: 101 mmol/L (ref 98–111)
Creatinine, Ser: 0.95 mg/dL (ref 0.61–1.24)
GFR, Estimated: >60 mL/min (ref >60)
Glucose, Bld: 97 mg/dL (ref 70–99)
Potassium: 4.2 mmol/L (ref 3.5–5.1)
Sodium: 137 mmol/L (ref 135–145)
Total Bilirubin: 0.8 mg/dL (ref 0.3–1.2)
Total Protein: 7.5 g/dL (ref 6.5–8.1)

## 2022-09-30 LAB — CBC WITH DIFFERENTIAL/PLATELET
Abs Immature Granulocytes: 0.03 10*3/uL (ref 0.00–0.07)
Basophils Absolute: 0.1 10*3/uL (ref 0.0–0.1)
Basophils Relative: 0 %
Eosinophils Absolute: 0.2 10*3/uL (ref 0.0–0.5)
Eosinophils Relative: 1 %
HCT: 47.2 % (ref 39.0–52.0)
Hemoglobin: 15.6 g/dL (ref 13.0–17.0)
Immature Granulocytes: 0 %
Lymphocytes Relative: 16 %
Lymphs Abs: 2.2 10*3/uL (ref 0.7–4.0)
MCH: 29.9 pg (ref 26.0–34.0)
MCHC: 33.1 g/dL (ref 30.0–36.0)
MCV: 90.6 fL (ref 80.0–100.0)
Monocytes Absolute: 0.9 10*3/uL (ref 0.1–1.0)
Monocytes Relative: 6 %
Neutro Abs: 10.9 10*3/uL — ABNORMAL HIGH (ref 1.7–7.7)
Neutrophils Relative %: 77 %
Platelets: 194 10*3/uL (ref 150–400)
RBC: 5.21 MIL/uL (ref 4.22–5.81)
RDW: 13.5 % (ref 11.5–15.5)
WBC: 14.2 10*3/uL — ABNORMAL HIGH (ref 4.0–10.5)
nRBC: 0 % (ref 0.0–0.2)

## 2022-09-30 LAB — URINALYSIS, ROUTINE W REFLEX MICROSCOPIC
Bacteria, UA: NONE SEEN
Bilirubin Urine: NEGATIVE
Glucose, UA: NEGATIVE mg/dL
Ketones, ur: 20 mg/dL — AB
Nitrite: NEGATIVE
Protein, ur: NEGATIVE mg/dL
RBC / HPF: >50 RBC/hpf — ABNORMAL HIGH (ref 0–5)
Specific Gravity, Urine: 1.026 (ref 1.005–1.030)
pH: 6 (ref 5.0–8.0)

## 2022-09-30 MED ORDER — IOHEXOL 300 MG/ML  SOLN
100.0000 mL | Freq: Once | INTRAMUSCULAR | Status: AC | PRN
  Administered 2022-09-30: 100 mL via INTRAVENOUS

## 2022-09-30 NOTE — ED Notes (Signed)
D/c'd protocol blood work.  Pt says he had labs drawn this morning.

## 2022-09-30 NOTE — ED Triage Notes (Addendum)
Pt reports has a r nephrostomy tube for the past 4 months. Reports tube accidentally got pulled on Monday and  C/O abd pain in r side of abd since then.   Reports has appt with urologist on Monday.

## 2022-10-01 MED ORDER — CEPHALEXIN 500 MG PO CAPS: 500.0000 mg | ORAL_CAPSULE | Freq: Three times a day (TID) | ORAL | 0 refills | Status: DC

## 2022-10-01 MED ORDER — CEPHALEXIN 500 MG PO CAPS
500.0000 mg | ORAL_CAPSULE | Freq: Once | ORAL | Status: AC
  Administered 2022-10-01: 500 mg via ORAL
  Filled 2022-10-01: qty 1

## 2022-10-01 NOTE — ED Provider Notes (Signed)
[facility] EMERGENCY DEPARTMENT Provider Note   CSN: [id number] Arrival date & time: 09/30/22  2005     History  Chief Complaint  Patient presents with   Abdominal Pain    Christian Kennedy is a 57 y.o. male.  Patient presents to the emergency department with complaints of right-sided abdominal pain.  Patient is concerned because he has a chronic right nephrostomy tube.  He reports that earlier this week it was accidentally pulled on and he feels like it came out a couple of inches.       Home Medications Prior to Admission medications   Medication Sig Start Date End Date Taking? Authorizing Provider  cephALEXin (KEFLEX) 500 MG capsule Take 1 capsule (500 mg total) by mouth 3 (three) times daily. 10/01/22  Yes Kymir Coles, Gwenyth Allegra, MD  b complex vitamins capsule Take 1 capsule by mouth daily.    [provider]  BLACK CURRANT SEED OIL PO Take 1 capsule by mouth daily.    [provider]  Cyanocobalamin (B-12 PO) Take 1 tablet by mouth daily.    [provider]  doxycycline (VIBRAMYCIN) 100 MG capsule Take 1 capsule (100 mg total) by mouth 2 (two) times daily. 09/05/22   Redwine, Madison A, PA-C  nicotine (NICODERM CQ - DOSED IN MG/24 HOURS) 14 mg/24hr patch Place 1 patch (14 mg total) onto the skin daily. 08/14/22 08/14/23  Roxan Hockey, MD  sodium chloride flush (NS) 0.9 % SOLN 10 ml syr use as directed 07/07/22   Plotnikov, Evie Lacks, MD  traMADol (ULTRAM) 50 MG tablet Take 1 tablet (50 mg total) by mouth every 8 (eight) hours as needed for moderate pain. 08/14/22   Roxan Hockey, MD  Vitamin D, Ergocalciferol, (DRISDOL) 1.25 MG (50000 UNIT) CAPS capsule Take 1 capsule (50,000 Units total) by mouth every 7 (seven) days. 07/07/22   Plotnikov, Evie Lacks, MD  zolpidem (AMBIEN) 10 MG tablet Take 0.5-1 tablets (5-10 mg total) by mouth at bedtime as needed for sleep. 07/07/22   Plotnikov, Evie Lacks, MD      Allergies    Macrobid [nitrofurantoin]     Review of Systems   Review of Systems  Physical Exam Updated Vital Signs BP (!) 146/87 (BP Location: Right Arm)   Pulse (!) 110   Temp 98.4 F (36.9 C) (Oral)   Resp 20   Ht '6\' 3"'$  (1.905 m)   Wt 99.8 kg   SpO2 99%   BMI 27.50 kg/m  Physical Exam Vitals and nursing note reviewed.  Constitutional:      General: He is not in acute distress.    Appearance: He is well-developed.  HENT:     Head: Normocephalic and atraumatic.     Mouth/Throat:     Mouth: Mucous membranes are moist.  Eyes:     General: Vision grossly intact. Gaze aligned appropriately.     Extraocular Movements: Extraocular movements intact.     Conjunctiva/sclera: Conjunctivae normal.  Cardiovascular:     Rate and Rhythm: Normal rate and regular rhythm.     Pulses: Normal pulses.     Heart sounds: Normal heart sounds, S1 normal and S2 normal. No murmur heard.    No friction rub. No gallop.  Pulmonary:     Effort: Pulmonary effort is normal. No respiratory distress.     Breath sounds: Normal breath sounds.  Abdominal:     Palpations: Abdomen is soft.     Tenderness: There is no abdominal tenderness. There is no guarding  or rebound.     Hernia: No hernia is present.     Comments: nephrostomy tube in place right flank, no surrounding erythema, no drainage  Musculoskeletal:        General: No swelling.     Cervical back: Full passive range of motion without pain, normal range of motion and neck supple. No pain with movement, spinous process tenderness or muscular tenderness. Normal range of motion.     Right lower leg: No edema.     Left lower leg: No edema.  Skin:    General: Skin is warm and dry.     Capillary Refill: Capillary refill takes less than 2 seconds.     Findings: No ecchymosis, erythema, lesion or wound.  Neurological:     Mental Status: He is alert and oriented to person, place, and time.     GCS: GCS eye subscore is 4. GCS verbal subscore is 5. GCS motor subscore is 6.     Cranial  Nerves: Cranial nerves 2-12 are intact.     Sensory: Sensation is intact.     Motor: Motor function is intact. No weakness or abnormal muscle tone.     Coordination: Coordination is intact.  Psychiatric:        Mood and Affect: Mood normal.        Speech: Speech normal.        Behavior: Behavior normal.     ED Results / Procedures / Treatments   Labs (all labs ordered are listed, but only abnormal results are displayed) Labs Reviewed  URINALYSIS, ROUTINE W REFLEX MICROSCOPIC - Abnormal; Notable for the following components:      Result Value   Hgb urine dipstick MODERATE (*)    Ketones, ur 20 (*)    Leukocytes,Ua SMALL (*)    RBC / HPF >50 (*)    All other components within normal limits    EKG None  Radiology CT ABDOMEN PELVIS W CONTRAST  Result Date: 09/30/2022 CLINICAL DATA:  Abdominal pain, post-op Patient reports he had a right-sided nephrostomy tube, "nephrostomy tube got pulled on Monday", right-sided abdominal pain since that time. History of bladder cancer. EXAM: CT ABDOMEN AND PELVIS WITH CONTRAST TECHNIQUE: Multidetector CT imaging of the abdomen and pelvis was performed using the standard protocol following bolus administration of intravenous contrast. RADIATION DOSE REDUCTION: This exam was performed according to the departmental dose-optimization program which includes automated exposure control, adjustment of the mA and/or kV according to patient size and/or use of iterative reconstruction technique. CONTRAST:  115m OMNIPAQUE IOHEXOL 300 MG/ML  SOLN COMPARISON:  CT 08/11/2022 FINDINGS: Lower chest: No acute basilar airspace disease or pulmonary nodule. No pleural fluid. Hepatobiliary: No focal hepatic lesion. Elongated right lobe of the liver. Unremarkable gallbladder. No biliary dilatation. Pancreas: No ductal dilatation or inflammation. Spleen: Normal in size without focal abnormality. Small splenule adjacent to hilum. Adrenals/Urinary Tract: No adrenal nodule.  Right-sided nephrostomy tube in place, pigtail coiled in the renal pelvis. Mild right perinephric edema which has slightly increased from prior CT. No fluid or fluid collection along the course of the nephrostomy. No perinephric collection. No right hydronephrosis. Moderate renal atrophy. The right ureter is chronically dilated to the bladder insertion. Nodular enhancement to the right urinary trigone appears increased from prior exam, approximately 3.3 x 1.5 cm, series 2, image 81. This previously measured 2 x 10 mm by my retrospective measurement. Fluid density structure in the upper pole the left kidney has slightly decreased in size from prior  exam, currently 2.1 cm, previously 2.3 cm, series 2, image 30. The adjacent fat stranding has resolved. Stomach/Bowel: Decompressed stomach, normal for degree of distension. No bowel obstruction or inflammatory change. Normal appendix visualized. Vascular/Lymphatic: Aortic atherosclerosis without aneurysm. No enlarged lymph nodes in the abdomen or pelvis. Reproductive: Mild prostatomegaly again seen. Other: No ascites. No free air. Tiny fat containing umbilical hernia. Musculoskeletal: There are no acute or suspicious osseous abnormalities. No focal osseous lesion. No muscular collection. IMPRESSION: 1. Right-sided nephrostomy tube in place, pigtail coiled in the renal pelvis. Mild right perinephric edema which has slightly increased from prior CT. No fluid or fluid collection along the course of the nephrostomy tube. No right hydronephrosis. 2. Nodular enhancement to the right urinary trigone appears increased from prior exam, likely corresponding to known malignancy. 3. Fluid density structure in the upper pole of the left kidney has slightly decreased in size from prior exam, currently 2.1 cm, previously 2.3 cm. The adjacent fat stranding has resolved. Recommend attention at follow-up. Aortic Atherosclerosis (ICD10-I70.0). Electronically Signed   By: Keith Rake  M.D.   On: 09/30/2022 22:33    Procedures Procedures    Medications Ordered in ED Medications  cephALEXin (KEFLEX) capsule 500 mg (has no administration in time range)  iohexol (OMNIPAQUE) 300 MG/ML solution 100 mL (100 mLs Intravenous Contrast Given 09/30/22 2208)    ED Course/ Medical Decision Making/ A&P                           Medical Decision Making  CT scan was performed.  Pigtail is appropriately placed with no hydronephrosis.  There is some anephric edema noted, unclear significance.  Other abnormality seen on the scan.  Urinalysis with red cells but no obvious infection.  Will empirically cover with antibiotics.  Patient has follow-up with urology in 5 days.  Given return precautions.        Final Clinical Impression(s) / ED Diagnoses Final diagnoses:  Abdominal pain, unspecified abdominal location    Rx / DC Orders ED Discharge Orders          Ordered    cephALEXin (KEFLEX) 500 MG capsule  3 times daily        10/01/22 0005              Orpah Greek, MD 10/01/22 0005

## 2022-10-01 NOTE — Discharge Instructions (Signed)
Follow-up with urology as planned.  If you develop a fever, decreased output from the nephrostomy tube or increasing pain, return to the ED.

## 2022-10-05 ENCOUNTER — Ambulatory Visit (INDEPENDENT_AMBULATORY_CARE_PROVIDER_SITE_OTHER): Payer: 59 | Admitting: Urology

## 2022-10-05 VITALS — BP 103/67 | HR 85

## 2022-10-05 DIAGNOSIS — C672 Malignant neoplasm of lateral wall of bladder: Secondary | ICD-10-CM | POA: Diagnosis not present

## 2022-10-05 DIAGNOSIS — D494 Neoplasm of unspecified behavior of bladder: Secondary | ICD-10-CM | POA: Diagnosis not present

## 2022-10-05 LAB — URINALYSIS, ROUTINE W REFLEX MICROSCOPIC
Bilirubin, UA: NEGATIVE
Glucose, UA: NEGATIVE
Ketones, UA: NEGATIVE
Leukocytes,UA: NEGATIVE
Nitrite, UA: NEGATIVE
Protein,UA: NEGATIVE
Specific Gravity, UA: 1.01 (ref 1.005–1.030)
Urobilinogen, Ur: 0.2 mg/dL (ref 0.2–1.0)
pH, UA: 5.5 (ref 5.0–7.5)

## 2022-10-05 LAB — MICROSCOPIC EXAMINATION
Bacteria, UA: NONE SEEN
RBC, Urine: >30 /hpf — AB (ref 0–2)

## 2022-10-05 MED ORDER — CIPROFLOXACIN HCL 500 MG PO TABS
500.0000 mg | ORAL_TABLET | Freq: Once | ORAL | Status: AC
  Administered 2022-10-05: 500 mg via ORAL

## 2022-10-06 ENCOUNTER — Other Ambulatory Visit (HOSPITAL_COMMUNITY): Payer: Self-pay | Admitting: Interventional Radiology

## 2022-10-06 ENCOUNTER — Ambulatory Visit (HOSPITAL_COMMUNITY)
Admission: RE | Admit: 2022-10-06 | Discharge: 2022-10-06 | Disposition: A | Discharge status: Home / Self Care | Payer: 59 | Source: Ambulatory Visit | Attending: Interventional Radiology | Admitting: Interventional Radiology

## 2022-10-06 DIAGNOSIS — Z436 Encounter for attention to other artificial openings of urinary tract: Secondary | ICD-10-CM | POA: Insufficient documentation

## 2022-10-06 DIAGNOSIS — D494 Neoplasm of unspecified behavior of bladder: Secondary | ICD-10-CM

## 2022-10-06 HISTORY — PX: IR NEPHROSTOMY EXCHANGE RIGHT: IMG6070

## 2022-10-06 MED ORDER — LIDOCAINE HCL 1 % IJ SOLN
INTRAMUSCULAR | Status: AC
  Filled 2022-10-06: qty 20

## 2022-10-06 MED ORDER — IOHEXOL 300 MG/ML  SOLN
50.0000 mL | Freq: Once | INTRAMUSCULAR | Status: AC | PRN
  Administered 2022-10-06: 5 mL

## 2022-10-06 NOTE — Procedures (Addendum)
Interventional Radiology Procedure Note  Procedure: Image guided drain exchange, right kidney PCN.  69F pigtail drain.  Complications: None  Recommendations: - Routine drain care, to gravity      Signed,  Dulcy Fanny. Earleen Newport, DO

## 2022-10-07 ENCOUNTER — Encounter: Payer: Self-pay | Admitting: *Deleted

## 2022-10-07 ENCOUNTER — Telehealth: Payer: Self-pay | Admitting: *Deleted

## 2022-10-07 NOTE — Patient Outreach (Signed)
Care Coordination [facility] Note Transition Care Management Follow-up Telephone Call Date of discharge and from where: Thursday 10/01/22- ED discharge; [facility] ED; (R) sided abdominal pain in setting of bladder CA with (R) nephrostomy tube ED EMMI Red Alert notification: "Lost interest, sad/ depressed hopeless" How have you been since you were released from the hospital? "I am doing okay after the visit... the CT came back normal and the tube was not displaced, and I saw my urologist who said everything looks okay.  I am taking the antibiotics like they told me to, feeling better overall.  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  My wife is picking up a lot of work to help [place] make ends meet, and that makes me feel bad too.  I really do not want to be on any medication for depression, I don't want to take any more medicine than I already do, but I would consider options for counseling, so yes, I would like to talk to the social worker... I am off from work on Tuesday's, if she can call me on a Tuesday.Marland KitchenMarland KitchenMarland KitchenMarland Kitchen would she be able to help me understand the ins-and outs of applying for disability? Any questions or concerns? Yes 1) admits to feeling sad/ depressed/ hopeless with loss of interest due to his limited ability to work; full depression screening completed- verified patient is not experiencing SI; confirmed patient is open to counseling options, but states he does not wish to have any new medication added to his routine medications 2) after scheduling follow up telephone appointment with LCSW, patient states he has questions about applying for disability; explained that he should contact social security department to initiate process, but added that LCSW may have additional information to provide-- will make LCSW  aware as FYI  Items Reviewed: Did the pt receive and understand the discharge instructions provided? Yes  Medications obtained and verified? Yes - full medication review completed; no discrepancies or concerns identified; patient confirms he self- manages medications and he denies questions or concerns around medications Other? No  Any new allergies since your discharge? No  Dietary orders reviewed? Yes Do you have support at home? Yes  spouse assists with care needs as indicated; patient reports he is essentially independent in all self-care activities  Home Care and Equipment/Supplies: Were home health services ordered? no If so, what is the name of the agency? N/A  Has the agency set up a time to come to the patient's home? not applicable Were any new equipment or medical supplies ordered?  No What is the name of the medical supply agency? N/A Were you able to get the supplies/equipment? not applicable Do you have any questions related to the use of the equipment or supplies? No N/A  Functional Questionnaire: (I = Independent and D = Dependent) ADLs: I  Bathing/Dressing- I  Meal Prep- I  Eating- I  Maintaining continence- I  Transferring/Ambulation- I  Managing Meds- I  Follow up appointments reviewed:  PCP Hospital f/u appt confirmed? No  Scheduled to see - on - @ [facility] f/u appt confirmed? Yes  Verified patient had follow up urology provider appointment on I/8/24; to visit oncology provider on Thursday 10/08/22 @ 11:30 am Are transportation arrangements needed? No  If their condition worsens, is the pt aware to call PCP or go to the Emergency Dept.? Yes Was the patient provided with contact information for the PCP's office or ED? No- patient declined; reports already has contact information for all care providers Was to pt encouraged to call back with questions or concerns? Yes- provided my direct phone number for any concerns, issues, or needs that arise  prior to scheduled LCSW care coordination telephone visit scheduled 10/20/22  SDOH assessments and interventions completed:   Yes SDOH Interventions Today    Flowsheet Row Most Recent Value  SDOH Interventions   Food Insecurity Interventions Intervention Not Indicated  Transportation Interventions Intervention Not Indicated  [patient drives self]  Depression Interventions/Treatment  --  [referral placed with [facility] CSW]      Care Coordination Interventions:  Referred for Care Coordination Services:  Clinical Social Work Completed full depression screening; care coordination with LCSW via [id number]    Encounter Outcome:  Pt. Visit Completed    Oneta Rack, RN, BSN, CCRN Alumnus RN CM Care Coordination/ Transition of [place] Management [phone]: direct office

## 2022-10-08 ENCOUNTER — Inpatient Hospital Stay (HOSPITAL_BASED_OUTPATIENT_CLINIC_OR_DEPARTMENT_OTHER): Payer: 59 | Admitting: Hematology

## 2022-10-08 VITALS — BP 140/102 | HR 106 | Temp 97.9°F | Resp 18 | Ht 75.0 in | Wt 224.9 lb

## 2022-10-08 DIAGNOSIS — C679 Malignant neoplasm of bladder, unspecified: Secondary | ICD-10-CM | POA: Diagnosis not present

## 2022-10-08 DIAGNOSIS — C689 Malignant neoplasm of urinary organ, unspecified: Secondary | ICD-10-CM | POA: Diagnosis not present

## 2022-10-08 DIAGNOSIS — F17210 Nicotine dependence, cigarettes, uncomplicated: Secondary | ICD-10-CM | POA: Diagnosis not present

## 2022-10-08 DIAGNOSIS — C7989 Secondary malignant neoplasm of other specified sites: Secondary | ICD-10-CM | POA: Diagnosis not present

## 2022-10-08 NOTE — Patient Instructions (Addendum)
[place] at [facility] Discharge Instructions   You were seen and examined today by Dr. Delton Coombes.  He reviewed the results of your CT scan that was done in the ER on September 30, 2022. It shows that the bladder cancer has grown. But based on the cystoscopy done by Dr. Alyson Ingles, you are telling us he said the tumor is stable. We will follow up on his report as it is not currently available for [place] to view.   We will see you back in 3 months. We will repeat a CT scan prior to your next visit.      Thank you for choosing [place] at [facility] to provide your oncology and hematology care.  To afford each patient quality time with our provider, please arrive at least 15 minutes before your scheduled appointment time.   If you have a lab appointment with the [facility] please come in thru the Main Entrance and check in at the main information desk.  You need to re-schedule your appointment should you arrive 10 or more minutes late.  We strive to give you quality time with our providers, and arriving late affects you and other patients whose appointments are after yours.  Also, if you no show three or more times for appointments you may be dismissed from the clinic at the providers discretion.     Again, thank you for choosing [facility].  Our hope is that these requests will decrease the amount of time that you wait before being seen by our physicians.       _____________________________________________________________  Should you have questions after your visit to [facility], please contact our office at [phone] and follow the prompts.  Our office hours are 8:00 a.m. and 4:30 p.m. Monday - Friday.  Please note that voicemails left after 4:00 p.m. may not be returned until the following business day.  We are closed weekends and major holidays.  You do have access to a nurse 24-7, just call the main number to the  clinic [phone] and do not press any options, hold on the line and a nurse will answer the phone.    For prescription refill requests, have your pharmacy contact our office and allow 72 hours.    Due to Covid, you will need to wear a mask upon entering the hospital. If you do not have a mask, a mask will be given to you at the Main Entrance upon arrival. For doctor visits, patients may have 1 support person age 68 or older with them. For treatment visits, patients can not have anyone with them due to social distancing guidelines and our immunocompromised population.

## 2022-10-08 NOTE — Progress Notes (Signed)
[place], [place] [address]   CLINIC:  Medical Oncology/Hematology  PCP:  Cassandria Anger, MD [facility] [address] [phone]   REASON FOR VISIT:  Follow-up for high-grade urothelial bladder cancer  PRIOR THERAPY: TURBT in 2019 and 2023  NGS Results: Not done  CURRENT THERAPY: Surveillance  BRIEF ONCOLOGIC HISTORY:  Oncology History   No history exists.    CANCER STAGING:  Cancer Staging  Bladder tumor Staging form: Urinary Bladder, AJCC 8th Edition - Clinical stage from 06/03/2022: Stage IIIA (cT3, cN0, cM0) - Unsigned    INTERVAL HISTORY:  Mr. Vallance 57 y.o. male seen for follow-up of high-grade urothelial carcinoma of the bladder.  He reportedly underwent cystoscopy by Dr. Alyson Ingles on 10/05/2022.  Reports lower abdominal pain/discomfort for the last 7 to 10 days.  He reportedly changed his diet to keto diet and has been experiencing constipation since then.  He was evaluated in the ER on 09/30/2022 with abdominal pain and bleeding at the site of the right PCN tube.  He also had right PCN changed on 10/06/2022.    REVIEW OF SYSTEMS:  Review of Systems  Neurological:  Positive for headaches.  Psychiatric/Behavioral:  Positive for depression. The patient is nervous/anxious.   All other systems reviewed and are negative.    PAST MEDICAL/SURGICAL HISTORY:  Past Medical History:  Diagnosis Date   Abscess    Right buttocks   Bladder tumor    Depression    Son passed 2014   Ear infection    Eating disorder    Frequent headaches    GERD (gastroesophageal reflux disease)    Helicobacter pylori ab+ 10/27/2017   Hepatitis    childhood, jaundice   Migraines    Urinary incontinence    Varicose vein of leg    Right leg   Vitamin D deficiency    Past Surgical History:  Procedure Laterality Date   CYSTOSCOPY W/ URETERAL STENT PLACEMENT Right 11/19/2017   Procedure: CYSTOSCOPY WITH RETROGRADE  PYELOGRAM/URETERAL STENT PLACEMENT;  Surgeon: Ceasar Mons, MD;  Location: [facility];  Service: Urology;  Laterality: Right;   CYSTOSCOPY WITH FULGERATION N/A 11/19/2017   Procedure: [place] AND CLOT EVACUATION;  Surgeon: Nickie Retort, MD;  Location: [facility];  Service: Urology;  Laterality: N/A;   IR NEPHROSTOMY EXCHANGE RIGHT  07/20/2022   IR NEPHROSTOMY EXCHANGE RIGHT  10/06/2022   IR NEPHROSTOMY PLACEMENT RIGHT  05/26/2022   TRANSURETHRAL RESECTION OF BLADDER TUMOR N/A 11/19/2017   Procedure: TRANSURETHRAL RESECTION OF BLADDER TUMOR (TURBT);  Surgeon: Ceasar Mons, MD;  Location: [facility];  Service: Urology;  Laterality: N/A;   TRANSURETHRAL RESECTION OF BLADDER TUMOR N/A 05/18/2022   Procedure: TRANSURETHRAL RESECTION OF BLADDER TUMOR (TURBT);  Surgeon: Cleon Gustin, MD;  Location: [facility];  Service: Urology;  Laterality: N/A;     SOCIAL HISTORY:  Social History   Socioeconomic History   Marital status: Married    Spouse name: Not on file   Number of children: Not on file   Years of education: Not on file   Highest education level: Not on file  Occupational History   Not on file  Tobacco Use   Smoking status: Every Day    Packs/day: 1.00    Years: 30.00    Total pack years: 30.00    Types: Cigarettes   Smokeless tobacco: Never  Vaping Use   Vaping Use: Never  used  Substance and Sexual Activity   Alcohol use: Never   Drug use: No   Sexual activity: Not on file  Other Topics Concern   Not on file  Social History Narrative   Not on file   Social Determinants of Health   Financial Resource Strain: Not on file  Food Insecurity: No Food Insecurity (10/07/2022)   Hunger Vital Sign    Worried About Running Out of Food in the Last Year: Never true    Ran Out of Food in the Last Year: Never true  Transportation Needs: No Transportation Needs (10/07/2022)   PRAPARE - Civil engineer, contracting (Medical): No    Lack of Transportation (Non-Medical): No  Physical Activity: Not on file  Stress: Not on file  Social Connections: Not on file  Intimate Partner Violence: Not At Risk (08/12/2022)   Humiliation, Afraid, Rape, and Kick questionnaire    Fear of Current or Ex-Partner: No    Emotionally Abused: No    Physically Abused: No    Sexually Abused: No    FAMILY HISTORY:  Family History  Problem Relation Age of Onset   Cancer Mother    Depression Mother    Arthritis Father    Depression Father    Hearing loss Father    Depression Daughter    Early death Son     CURRENT MEDICATIONS:  Outpatient Encounter Medications as of 10/08/2022  Medication Sig Note   b complex vitamins capsule Take 1 capsule by mouth daily.    BLACK CURRANT SEED OIL PO Take 1 capsule by mouth daily.    cephALEXin (KEFLEX) 500 MG capsule Take 1 capsule (500 mg total) by mouth 3 (three) times daily.    Cyanocobalamin (B-12 PO) Take 1 tablet by mouth daily.    doxycycline (VIBRAMYCIN) 100 MG capsule Take 1 capsule (100 mg total) by mouth 2 (two) times daily. (Patient not taking: Reported on 10/07/2022)    nicotine (NICODERM CQ - DOSED IN MG/24 HOURS) 14 mg/24hr patch Place 1 patch (14 mg total) onto the skin daily. (Patient not taking: Reported on 10/07/2022)    sodium chloride flush (NS) 0.9 % SOLN 10 ml syr use as directed    traMADol (ULTRAM) 50 MG tablet Take 1 tablet (50 mg total) by mouth every 8 (eight) hours as needed for moderate pain. 10/07/2022: 10/07/22: reports takes as needed   Vitamin D, Ergocalciferol, (DRISDOL) 1.25 MG (50000 UNIT) CAPS capsule Take 1 capsule (50,000 Units total) by mouth every 7 (seven) days.    zolpidem (AMBIEN) 10 MG tablet Take 0.5-1 tablets (5-10 mg total) by mouth at bedtime as needed for sleep. 10/07/2022: 10/07/22- reports has not needed lately, has if needed   No facility-administered encounter medications on file as of 10/08/2022.    ALLERGIES:   Allergies  Allergen Reactions   Macrobid [Nitrofurantoin]     ?epig abd pain     PHYSICAL EXAM:  ECOG Performance status: 0  There were no vitals filed for this visit.  There were no vitals filed for this visit.  Physical Exam Vitals reviewed.  Constitutional:      Appearance: Normal appearance.  Cardiovascular:     Rate and Rhythm: Normal rate and regular rhythm.     Heart sounds: Normal heart sounds.  Pulmonary:     Breath sounds: Normal breath sounds.  Abdominal:     Palpations: Abdomen is soft.  Neurological:     Mental Status: He is alert.  Psychiatric:        Mood and Affect: Mood normal.        Behavior: Behavior normal.     LABORATORY DATA:  I have reviewed the labs as listed.  CBC    Component Value Date/Time   WBC 14.2 (H) 09/30/2022 0905   RBC 5.21 09/30/2022 0905   HGB 15.6 09/30/2022 0905   HCT 47.2 09/30/2022 0905   PLT 194 09/30/2022 0905   MCV 90.6 09/30/2022 0905   MCH 29.9 09/30/2022 0905   MCHC 33.1 09/30/2022 0905   RDW 13.5 09/30/2022 0905   LYMPHSABS 2.2 09/30/2022 0905   MONOABS 0.9 09/30/2022 0905   EOSABS 0.2 09/30/2022 0905   BASOSABS 0.1 09/30/2022 0905      Latest Ref Rng & Units 09/30/2022    9:05 AM 08/13/2022    3:29 AM 08/12/2022    3:52 AM  CMP  Glucose 70 - 99 mg/dL 97  87  95   BUN 6 - 20 mg/dL '19  16  15   '$ Creatinine 0.61 - 1.24 mg/dL 0.95  1.21  1.05   Sodium 135 - 145 mmol/L 137  138  136   Potassium 3.5 - 5.1 mmol/L 4.2  4.0  3.9   Chloride 98 - 111 mmol/L 101  104  104   CO2 22 - 32 mmol/L '26  27  24   '$ Calcium 8.9 - 10.3 mg/dL 9.4  8.6  8.6   Total Protein 6.5 - 8.1 g/dL 7.5   6.3   Total Bilirubin 0.3 - 1.2 mg/dL 0.8   1.1   Alkaline Phos 38 - 126 U/L 55   45   AST 15 - 41 U/L 14   11   ALT 0 - 44 U/L 13   13     DIAGNOSTIC IMAGING:  I have independently reviewed the scans and discussed with the patient.  ASSESSMENT: 1.  Muscle invasive bladder cancer, stage III (T3N0): - TURBT of the right bladder  tumor (11/19/2017): Invasive high-grade urothelial carcinoma involving muscularis propria - CTAP without contrast (04/17/2022): 4.2 cm polypoid mass in the right posterior bladder developing since prior study suspicious for recurrent bladder cancer.  Masses causing obstruction of right ureter with severe right hydronephrosis. - TURBT (05/18/2022): Infiltrating high-grade urothelial carcinoma involving muscularis propria.  LVI is identified. - 05/26/2022: Right nephrostomy tube placement - 40 pound weight loss in the last 6 months due to diet change, cutting of sugars/gluten and decreasing quantity of food  - CT CAP (06/15/2022): Right-sided bladder cancer involving distal right ureter.  Resolution of right hydronephrosis following percutaneous nephrostomy placement.  No evidence of metastatic disease in the chest, abdomen or pelvis. - Bone scan (06/16/2022): No evidence of metastatic disease. - He was evaluated by Dr. Tresa Moore and was recommended neoadjuvant chemotherapy followed by cystoprostatectomy/conduit diversion (not candidate for continent diversion as stage III). - We discussed the role of neoadjuvant cisplatin based combination chemotherapy.  We discussed side effects also.  Patient reports that he is been doing a lot of prayer and is not interested in taking chemotherapy at this time.  2.  Social/family history: - He lives at home with his wife.  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  He had exposure to oil-based additives, tires seals and other chemicals.  He is a current active smoker, 1 pack/day for the last 37 years. - Mother had colon cancer.     PLAN:  1.  Stage III (T3N0) high-grade urothelial carcinoma of the right bladder: - I have reviewed CTAP from 09/30/2022: Nodular and enhancement to the right urinary trigone appears to increase from prior exam, measuring 3.3 x 1.5 cm, previously 2 x 1 cm.  Fluid density structure in the upper pole of the left  kidney is slightly increased in size from prior exam, 2.1 cm, previously 2.3 cm. - No evidence of metastatic disease. - Labs from 09/30/2022 reviewed showed normal LFTs, creatinine and electrolytes.  CBC was grossly normal. - He has changed his diet to keto diet in the last few weeks and has been experiencing more constipation and lower abdominal pain/discomfort.  He is reportedly listening to podcast by Dr. Sherian Maroon. - He reportedly had cystoscopy with Dr. Alyson Ingles on 10/05/2022.  I will follow-up on the report to see if the tumor has grown in size. - I have recommended continued follow-up in 3 months with repeat CT CAP for restaging and labs.      Orders placed this encounter:  No orders of the defined types were placed in this encounter.     Derek Jack, MD [place] [phone]

## 2022-10-08 NOTE — Addendum Note (Signed)
Addended by: Joie Bimler on: 10/08/2022 03:28 PM   Modules accepted: Orders

## 2022-10-12 ENCOUNTER — Encounter: Payer: Self-pay | Admitting: Urology

## 2022-10-12 NOTE — Progress Notes (Signed)
     CC: followup bladder cancer   HPI: [name] is a 57yo here for followup for muscle invasive bladder cancer Blood pressure 103/67, pulse 85. NED. A&Ox3.   No respiratory distress   Abd soft, NT, ND Normal phallus with bilateral descended testicles  Cystoscopy Procedure Note  Patient identification was confirmed, informed consent was obtained, and patient was prepped using Betadine solution.  Lidocaine jelly was administered per urethral meatus.     Pre-Procedure: - Inspection reveals a normal caliber ureteral meatus.  Procedure: The flexible cystoscope was introduced without difficulty - No urethral strictures/lesions are present. - Enlarged prostate  - Normal bladder neck - Bilateral ureteral orifices identified - 5cm tumor involving right lateral wall and ureteral orifice - No bladder stones - No trabeculation     Post-Procedure: - Patient tolerated the procedure well  Assessment/ Plan: We discussed the management of bladder tumors including transurethral resection. AFter discussing the procedure the patient wishes to pursue therapy. Risks/benefits/alternatives discussed  No follow-ups on file.  [name], MD

## 2022-10-12 NOTE — Patient Instructions (Signed)

## 2022-10-20 ENCOUNTER — Ambulatory Visit: Payer: Self-pay | Admitting: Licensed Clinical Social Worker

## 2022-10-20 NOTE — Patient Outreach (Signed)
  Care Coordination  Initial Visit Note   10/20/2022 Name: QUINTELL BONNIN MRN: [id number] DOB: [birth year]  Kathrine Haddock Maiden is a 57 y.o. year old male who sees Plotnikov, Evie Lacks, MD for primary care. I spoke with  Miliano E Filippone by phone today.  What matters to the patients health and wellness today?    Patient is experiencing symptoms of  stress and mild depression which seems to be exacerbated by health concerns, decline in health and financial strain..     Goals Addressed             This Visit's Progress    Care Coordination Activities       Care Coordination Interventions: Motivational Interviewing employed Depression screen reviewed  Solution-Focused Strategies employed:  Active listening / Reflection utilized  Emotional Support Provided Problem [place] strategies reviewed Ambivalent about moving forward with therapy    Allow space to process and discuss stressors related to his health Discussed community resources options          SDOH assessments and interventions completed:  Yes  SDOH Interventions Today    Flowsheet Row Most Recent Value  SDOH Interventions   Stress Interventions Provide Counseling       Care Coordination Interventions:  Yes, provided   Follow up plan: Follow up call scheduled for 11/03/22    Encounter Outcome:  Pt. Visit Completed   Casimer Lanius, [place] [phone]

## 2022-10-20 NOTE — Patient Instructions (Signed)
Visit Information  Thank you for taking time to visit with me today. Please don't hesitate to contact me if I can be of assistance to you.   Following are the goals we discussed today:   Goals Addressed             This Visit's Progress    Care Coordination Activities       Care Coordination Interventions: Motivational Interviewing employed Depression screen reviewed  Solution-Focused Strategies employed:  Active listening / Reflection utilized  Emotional Support Provided Problem [place] strategies reviewed Ambivalent about moving forward with therapy    Allow space to process and discuss stressors related to his health Discussed community resources options          Our next appointment is by telephone on 11/03/2022   Please call the care guide team at [phone] if you need to cancel or reschedule your appointment.   If you are experiencing a Mental Health or [place] or need someone to talk to, please call the Suicide and Crisis Lifeline: 988 call the [place] National Suicide Prevention Lifeline: 713-418-4908 or TTY: 6306991359 TTY 9152564033) to talk to a trained counselor call 1-800-273-TALK (toll free, 24 hour hotline) call the [facility]: [phone]   Patient verbalizes understanding of instructions and care plan provided today and agrees to view in [place]. Active MyChart status and patient understanding of how to access instructions and care plan via MyChart confirmed with patient.     Casimer Lanius, [place] [phone]

## 2022-11-03 ENCOUNTER — Encounter: Payer: Self-pay | Admitting: Licensed Clinical Social Worker

## 2022-11-03 ENCOUNTER — Telehealth: Payer: Self-pay | Admitting: Licensed Clinical Social Worker

## 2022-11-03 NOTE — Patient Instructions (Signed)
    I am sorry you were unable to keep your phone appointment today.   The Care Guide will contact you to reschedule the phone appointment    Arlis Yale, LCSW Social Work Care Coordination  [place] [facility] [phone]  

## 2022-11-03 NOTE — Patient Outreach (Signed)
  Care Coordination   11/03/2022 Name: Christian Kennedy MRN: [id number] DOB: [birth year]   Care Coordination Outreach Attempts:  An unsuccessful telephone outreach was attempted for a scheduled appointment today.   Follow Up Plan:  Additional outreach attempts will be made to offer the patient care coordination information and services. Will ask care guide team to reach out to patient to see if he would like to reschedule.   Encounter Outcome:  No Answer   Care Coordination Interventions:  No, not indicated    Casimer Lanius, [place] [phone]

## 2022-11-09 ENCOUNTER — Encounter (HOSPITAL_COMMUNITY)
Admission: RE | Admit: 2022-11-09 | Discharge: 2022-11-09 | Disposition: A | Discharge status: Home / Self Care | Payer: 59 | Source: Ambulatory Visit | Attending: Urology | Admitting: Urology

## 2022-11-10 ENCOUNTER — Ambulatory Visit: Payer: Self-pay | Admitting: Licensed Clinical Social Worker

## 2022-11-10 NOTE — Patient Instructions (Signed)
Visit Information  Thank you for taking time to visit with me today. Please don't hesitate to contact me if I can be of assistance to you.   Following are the goals we discussed today:   Goals Addressed             This Visit's Progress    Decrease Stress associated with finances and health concerns       Activities in order to accomplish goals.   Please review the information e-mailed on applying for disability I have provide the link to to website and contact information           Our next appointment is by telephone on 12/03/22 at 10:00  Please call the care guide team at [phone] if you need to cancel or reschedule your appointment.   If you are experiencing a Mental Health or [place] or need someone to talk to, please call the Suicide and Crisis Lifeline: 988 call the [place] National Suicide Prevention Lifeline: (978) 359-8073 or TTY: 902-824-8949 TTY 956 027 9070) to talk to a trained counselor call 1-800-273-TALK (toll free, 24 hour hotline) call the [facility]: [phone]   Patient verbalizes understanding of instructions and care plan provided today and agrees to view in [place]. Active MyChart status and patient understanding of how to access instructions and care plan via MyChart confirmed with patient.     Casimer Lanius, [place] [phone]

## 2022-11-10 NOTE — Patient Outreach (Signed)
  Care Coordination   Follow Up Visit Note   11/10/2022 Name: CLAYTEN ALLCOCK MRN: [id number] DOB: 1966-05-17  Kathrine Haddock [place] is a 57 y.o. year old male who sees Plotnikov, Evie Lacks, MD for primary care. I spoke with  Iann E Hargens by phone today.  What matters to the patients health and wellness today?  Managing stress related to health and finances.  He continues to work while dealing with pain.   Goals Addressed             This Visit's Progress    Decrease Stress associated with finances and health concerns       Activities in order to accomplish goals.   Please review the information e-mailed on applying for disability I have provide the link to to website and contact information           SDOH assessments and interventions completed:  No   Care Coordination Interventions:  Yes, provided  Interventions Today    Flowsheet Row Most Recent Value  Chronic Disease   Chronic disease during today's visit Other  [PTSD]  General Interventions   General Interventions Discussed/Reviewed Communication with, General Interventions Reviewed  Communication with --  [facility] [phone]  Education Interventions   Education Provided Provided Web-based Education, Provided Education  Provided Verbal Education On [employer] application and process]  Mental Health Interventions   Mental Health Discussed/Reviewed Grief and Loss, Mental Health Reviewed  [related related to health]       Follow up plan: Follow up call scheduled for 12/03/22    Encounter Outcome:  Pt. Visit Completed   Casimer Lanius, [place] [phone]

## 2022-11-12 ENCOUNTER — Ambulatory Visit (HOSPITAL_COMMUNITY)
Admission: RE | Admit: 2022-11-12 | Discharge: 2022-11-12 | Disposition: A | Discharge status: Home / Self Care | Payer: 59 | Attending: Urology | Admitting: Urology

## 2022-11-12 ENCOUNTER — Ambulatory Visit (HOSPITAL_COMMUNITY): Payer: 59

## 2022-11-12 ENCOUNTER — Ambulatory Visit (HOSPITAL_BASED_OUTPATIENT_CLINIC_OR_DEPARTMENT_OTHER): Payer: 59 | Admitting: Certified Registered"

## 2022-11-12 ENCOUNTER — Encounter (HOSPITAL_COMMUNITY)
Admission: RE | Disposition: A | Discharge status: Home / Self Care | Payer: Self-pay | Source: Home / Self Care | Attending: Urology

## 2022-11-12 ENCOUNTER — Encounter (HOSPITAL_COMMUNITY): Payer: Self-pay | Admitting: Urology

## 2022-11-12 ENCOUNTER — Ambulatory Visit (HOSPITAL_COMMUNITY): Payer: 59 | Admitting: Certified Registered"

## 2022-11-12 ENCOUNTER — Other Ambulatory Visit: Payer: Self-pay | Admitting: Urology

## 2022-11-12 DIAGNOSIS — D494 Neoplasm of unspecified behavior of bladder: Secondary | ICD-10-CM | POA: Diagnosis not present

## 2022-11-12 DIAGNOSIS — C672 Malignant neoplasm of lateral wall of bladder: Secondary | ICD-10-CM | POA: Diagnosis not present

## 2022-11-12 DIAGNOSIS — F17210 Nicotine dependence, cigarettes, uncomplicated: Secondary | ICD-10-CM | POA: Diagnosis not present

## 2022-11-12 HISTORY — PX: TRANSURETHRAL RESECTION OF BLADDER TUMOR: SHX2575

## 2022-11-12 HISTORY — PX: CYSTOSCOPY W/ RETROGRADES: SHX1426

## 2022-11-12 SURGERY — CYSTOSCOPY, WITH RETROGRADE PYELOGRAM
Anesthesia: General | Site: Renal

## 2022-11-12 MED ORDER — ONDANSETRON HCL 4 MG/2ML IJ SOLN
INTRAMUSCULAR | Status: AC
  Filled 2022-11-12: qty 2

## 2022-11-12 MED ORDER — FENTANYL CITRATE PF 50 MCG/ML IJ SOSY
PREFILLED_SYRINGE | INTRAMUSCULAR | Status: AC
  Filled 2022-11-12: qty 1

## 2022-11-12 MED ORDER — PHENYLEPHRINE 80 MCG/ML (10ML) SYRINGE FOR IV PUSH (FOR BLOOD PRESSURE SUPPORT)
PREFILLED_SYRINGE | INTRAVENOUS | Status: DC | PRN
  Administered 2022-11-12: 80 ug via INTRAVENOUS

## 2022-11-12 MED ORDER — OXYCODONE-ACETAMINOPHEN 5-325 MG PO TABS: 1.0000 | ORAL_TABLET | ORAL | 0 refills | Status: DC | PRN

## 2022-11-12 MED ORDER — DEXAMETHASONE SODIUM PHOSPHATE 4 MG/ML IJ SOLN
INTRAMUSCULAR | Status: DC | PRN
  Administered 2022-11-12: 5 mg via INTRAVENOUS

## 2022-11-12 MED ORDER — PROPOFOL 10 MG/ML IV BOLUS
INTRAVENOUS | Status: DC | PRN
  Administered 2022-11-12: 200 mg via INTRAVENOUS

## 2022-11-12 MED ORDER — ORAL CARE MOUTH RINSE: 15.0000 mL | Freq: Once | OROMUCOSAL | Status: DC

## 2022-11-12 MED ORDER — DEXAMETHASONE SODIUM PHOSPHATE 10 MG/ML IJ SOLN
INTRAMUSCULAR | Status: AC
  Filled 2022-11-12: qty 1

## 2022-11-12 MED ORDER — CEFAZOLIN SODIUM-DEXTROSE 2-4 GM/100ML-% IV SOLN
INTRAVENOUS | Status: AC
  Filled 2022-11-12: qty 100

## 2022-11-12 MED ORDER — CEFAZOLIN SODIUM-DEXTROSE 2-4 GM/100ML-% IV SOLN
2.0000 g | INTRAVENOUS | Status: AC
  Administered 2022-11-12: 2 g via INTRAVENOUS

## 2022-11-12 MED ORDER — ROCURONIUM BROMIDE 10 MG/ML (PF) SYRINGE
PREFILLED_SYRINGE | INTRAVENOUS | Status: DC | PRN
  Administered 2022-11-12: 50 mg via INTRAVENOUS
  Administered 2022-11-12: 10 mg via INTRAVENOUS

## 2022-11-12 MED ORDER — FENTANYL CITRATE (PF) 250 MCG/5ML IJ SOLN
INTRAMUSCULAR | Status: DC | PRN
  Administered 2022-11-12 (×5): 50 ug via INTRAVENOUS

## 2022-11-12 MED ORDER — CHLORHEXIDINE GLUCONATE 0.12 % MT SOLN: 15.0000 mL | Freq: Once | OROMUCOSAL | Status: DC

## 2022-11-12 MED ORDER — OXYCODONE HCL 5 MG/5ML PO SOLN: 5.0000 mg | Freq: Once | ORAL | Status: AC | PRN

## 2022-11-12 MED ORDER — OXYCODONE HCL 5 MG PO TABS
5.0000 mg | ORAL_TABLET | Freq: Once | ORAL | Status: AC | PRN
  Administered 2022-11-12: 5 mg via ORAL
  Filled 2022-11-12: qty 1

## 2022-11-12 MED ORDER — MIDAZOLAM HCL 2 MG/2ML IJ SOLN
INTRAMUSCULAR | Status: AC
  Filled 2022-11-12: qty 2

## 2022-11-12 MED ORDER — LACTATED RINGERS IV SOLN: INTRAVENOUS | Status: DC

## 2022-11-12 MED ORDER — LACTATED RINGERS IV SOLN: INTRAVENOUS | Status: DC | PRN

## 2022-11-12 MED ORDER — STERILE WATER FOR IRRIGATION IR SOLN
Status: DC | PRN
  Administered 2022-11-12: 500 mL

## 2022-11-12 MED ORDER — LIDOCAINE HCL (PF) 2 % IJ SOLN
INTRAMUSCULAR | Status: AC
  Filled 2022-11-12: qty 5

## 2022-11-12 MED ORDER — MIDAZOLAM HCL 2 MG/2ML IJ SOLN
INTRAMUSCULAR | Status: DC | PRN
  Administered 2022-11-12: 2 mg via INTRAVENOUS

## 2022-11-12 MED ORDER — DIATRIZOATE MEGLUMINE 30 % UR SOLN
URETHRAL | Status: DC | PRN
  Administered 2022-11-12: 20 mL via URETHRAL

## 2022-11-12 MED ORDER — ONDANSETRON HCL 4 MG/2ML IJ SOLN
INTRAMUSCULAR | Status: DC | PRN
  Administered 2022-11-12: 4 mg via INTRAVENOUS

## 2022-11-12 MED ORDER — LIDOCAINE 2% (20 MG/ML) 5 ML SYRINGE
INTRAMUSCULAR | Status: DC | PRN
  Administered 2022-11-12: 100 mg via INTRAVENOUS

## 2022-11-12 MED ORDER — FENTANYL CITRATE (PF) 250 MCG/5ML IJ SOLN
INTRAMUSCULAR | Status: AC
  Filled 2022-11-12: qty 5

## 2022-11-12 MED ORDER — PHENYLEPHRINE 80 MCG/ML (10ML) SYRINGE FOR IV PUSH (FOR BLOOD PRESSURE SUPPORT)
PREFILLED_SYRINGE | INTRAVENOUS | Status: AC
  Filled 2022-11-12: qty 10

## 2022-11-12 MED ORDER — DEXMEDETOMIDINE HCL IN NACL 80 MCG/20ML IV SOLN
INTRAVENOUS | Status: DC | PRN
  Administered 2022-11-12: 4 ug via BUCCAL
  Administered 2022-11-12 (×2): 8 ug via BUCCAL

## 2022-11-12 MED ORDER — FENTANYL CITRATE PF 50 MCG/ML IJ SOSY
25.0000 ug | PREFILLED_SYRINGE | INTRAMUSCULAR | Status: DC | PRN
  Administered 2022-11-12 (×3): 50 ug via INTRAVENOUS
  Filled 2022-11-12 (×2): qty 1

## 2022-11-12 MED ORDER — DIATRIZOATE MEGLUMINE 30 % UR SOLN
URETHRAL | Status: AC
  Filled 2022-11-12: qty 100

## 2022-11-12 MED ORDER — TRAMADOL HCL 50 MG PO TABS: 50.0000 mg | ORAL_TABLET | Freq: Three times a day (TID) | ORAL | 0 refills | Status: DC | PRN

## 2022-11-12 MED ORDER — SUGAMMADEX SODIUM 200 MG/2ML IV SOLN
INTRAVENOUS | Status: DC | PRN
  Administered 2022-11-12: 200 mg via INTRAVENOUS

## 2022-11-12 MED ORDER — SODIUM CHLORIDE 0.9 % IR SOLN
Status: DC | PRN
  Administered 2022-11-12 (×4): 3000 mL

## 2022-11-12 MED ORDER — PROPOFOL 10 MG/ML IV BOLUS
INTRAVENOUS | Status: AC
  Filled 2022-11-12: qty 20

## 2022-11-12 MED ORDER — ROCURONIUM BROMIDE 10 MG/ML (PF) SYRINGE
PREFILLED_SYRINGE | INTRAVENOUS | Status: AC
  Filled 2022-11-12: qty 10

## 2022-11-12 MED ORDER — ONDANSETRON HCL 4 MG/2ML IJ SOLN: 4.0000 mg | Freq: Once | INTRAMUSCULAR | Status: DC | PRN

## 2022-11-12 SURGICAL SUPPLY — 27 items
BAG DRAIN URO TABLE W/ADPT NS (BAG) ×3 IMPLANT
BAG DRN 8 ADPR NS SKTRN CSTL (BAG) ×2
BAG DRN RND TRDRP ANRFLXCHMBR (UROLOGICAL SUPPLIES) ×2
BAG HAMPER (MISCELLANEOUS) ×3 IMPLANT
BAG URINE DRAIN 2000ML AR STRL (UROLOGICAL SUPPLIES) ×3 IMPLANT
CATH FOLEY 3WAY 30CC 22F (CATHETERS) IMPLANT
CATH INTERMIT  6FR 70CM (CATHETERS) ×3 IMPLANT
CLOTH BEACON ORANGE TIMEOUT ST (SAFETY) ×3 IMPLANT
ELECT LOOP 22F BIPOLAR SML (ELECTROSURGICAL) ×2
ELECTRODE LOOP 22F BIPOLAR SML (ELECTROSURGICAL) ×3 IMPLANT
GLOVE BIO SURGEON STRL SZ8 (GLOVE) ×3 IMPLANT
GLOVE BIOGEL PI IND STRL 7.0 (GLOVE) ×6 IMPLANT
GOWN STRL REUS W/TWL LRG LVL3 (GOWN DISPOSABLE) ×3 IMPLANT
GOWN STRL REUS W/TWL XL LVL3 (GOWN DISPOSABLE) ×3 IMPLANT
GUIDEWIRE STR DUAL SENSOR (WIRE) IMPLANT
IV NS IRRIG 3000ML ARTHROMATIC (IV SOLUTION) ×6 IMPLANT
KIT TURNOVER CYSTO (KITS) ×3 IMPLANT
MANIFOLD NEPTUNE II (INSTRUMENTS) ×3 IMPLANT
PACK CYSTO (CUSTOM PROCEDURE TRAY) ×3 IMPLANT
PAD ARMBOARD 7.5X6 YLW CONV (MISCELLANEOUS) ×3 IMPLANT
PLUG CATH AND CAP STER (CATHETERS) IMPLANT
SET IRRIG Y TYPE TUR BLADDER L (SET/KITS/TRAYS/PACK) IMPLANT
SYR 30ML LL (SYRINGE) ×3 IMPLANT
SYR TOOMEY IRRIG 70ML (MISCELLANEOUS) ×2
SYRINGE TOOMEY IRRIG 70ML (MISCELLANEOUS) ×3 IMPLANT
TOWEL OR 17X26 4PK STRL BLUE (TOWEL DISPOSABLE) ×3 IMPLANT
WATER STERILE IRR 500ML POUR (IV SOLUTION) ×3 IMPLANT

## 2022-11-12 NOTE — Transfer of Care (Signed)
Immediate Anesthesia Transfer of Care Note  Patient: Christian Kennedy  Procedure(s) Performed: CYSTOSCOPY WITH RETROGRADE PYELOGRAM (Bilateral: Renal) TRANSURETHRAL RESECTION OF BLADDER TUMOR (TURBT) (Bladder)  Patient Location: PACU  Anesthesia Type:General  Level of Consciousness: awake, alert , and oriented  Airway & Oxygen Therapy: Patient Spontanous Breathing and Patient connected to face mask oxygen  Post-op Assessment: Report given to RN, Post -op Vital signs reviewed and stable, and Patient moving all extremities X 4  Post vital signs: Reviewed and stable  Last Vitals:  Vitals Value Taken Time  BP 138/86 11/12/22 0945  Temp    Pulse 77 11/12/22 0947  Resp 16 11/12/22 0947  SpO2 100 % 11/12/22 0947  Vitals shown include unvalidated device data.  Last Pain:  Vitals:   11/12/22 0700  TempSrc: Oral  PainSc: 2       Patients Stated Pain Goal: 7 ([id number])  Complications: No notable events documented.

## 2022-11-12 NOTE — Op Note (Signed)
.  Preoperative diagnosis: bladder tumor  Postoperative diagnosis: Same  Procedure: 1 cystoscopy 2. Left retrograde pyelography 3.  Intraoperative fluoroscopy, under one hour, with interpretation 4.  Transurethral resection of bladder tumor, large  Attending: Rosie Fate  Anesthesia: General  Estimated blood loss: Minimal  Drains: 22 French foley  Specimens: bladder tumor  Antibiotics: ancef  Findings: 7cm sessile right lateral wall tumor.  No left hydronephrosis. Unable to identify right ureteral orifice  Indications: Patient is a 57 year old male with a history of muscle invasive bladder cancer and gross hematuria.  After discussing treatment options, they decided proceed with transurethral resection of a bladder tumor.  Procedure her in detail: The patient was brought to the operating room and a brief timeout was done to ensure correct patient, correct procedure, correct site.  General anesthesia was administered patient was placed in dorsal lithotomy position.  Their genitalia was then prepped and draped in usual sterile fashion.  A rigid 55 French cystoscope was passed in the urethra and the bladder.  Bladder was inspected and we noted a 7cm bladder tumor.  the ureteral orifices were in the normal orthotopic locations.  a 6 french ureteral catheter was then instilled into the left ureteral orifice.  a gentle retrograde was obtained and findings noted above. We were unable to identify the right ureteral orifice. We then removed the cystoscope and placed a resectoscope into the bladder. We proceeded to remove the large clot burden from the bladder. Once this was complete we turned our attention to the bladder tumor. Using the bipolar resectoscope we removed the bladder tumor down to the base. A subsequent muscle deep biopsy was then taken. Hemostasis was then obtained with electrocautery. We then removed the bladder tumor chips and sent them for pathology. We then re-inspected the  bladder and found no residula bleeding.  the bladder was then drained, a 22 French foley was placed and this concluded the procedure which was well tolerated by patient.  Complications: None  Condition: Stable, extubated, transferred to PACU  Plan: Patient is to be discharged home and followup in 7 days for foley catheter removal and pathology discussion.

## 2022-11-12 NOTE — Anesthesia Preprocedure Evaluation (Signed)
Anesthesia Evaluation  Patient identified by MRN, date of birth, ID band Patient awake    Reviewed: Allergy & Precautions, H&P , NPO status , Patient's Chart, lab work & pertinent test results, reviewed documented beta blocker date and time   Airway Mallampati: II  TM Distance: >3 FB Neck ROM: full    Dental no notable dental hx.    Pulmonary neg pulmonary ROS, Current Smoker   Pulmonary exam normal breath sounds clear to auscultation       Cardiovascular Exercise Tolerance: Good negative cardio ROS  Rhythm:regular Rate:Normal     Neuro/Psych  Headaches PSYCHIATRIC DISORDERS Anxiety Depression     Neuromuscular disease negative neurological ROS  negative psych ROS   GI/Hepatic negative GI ROS, Neg liver ROS,GERD  ,,(+) Hepatitis -  Endo/Other  negative endocrine ROS    Renal/GU Renal diseasenegative Renal ROS  negative genitourinary   Musculoskeletal   Abdominal   Peds  Hematology negative hematology ROS (+)   Anesthesia Other Findings   Reproductive/Obstetrics negative OB ROS                             Anesthesia Physical Anesthesia Plan  ASA: 2  Anesthesia Plan: General and General LMA   Post-op Pain Management:    Induction:   PONV Risk Score and Plan:   Airway Management Planned:   Additional Equipment:   Intra-op Plan:   Post-operative Plan:   Informed Consent: I have reviewed the patients History and Physical, chart, labs and discussed the procedure including the risks, benefits and alternatives for the proposed anesthesia with the patient or authorized representative who has indicated his/her understanding and acceptance.     Dental Advisory Given  Plan Discussed with: CRNA  Anesthesia Plan Comments:        Anesthesia Quick Evaluation

## 2022-11-12 NOTE — Anesthesia Procedure Notes (Signed)
Procedure Name: Intubation Date/Time: 11/12/2022 8:41 AM  Performed by: Orlie Dakin, CRNAPre-anesthesia Checklist: Patient identified, Emergency Drugs available, Suction available and Patient being monitored Patient Re-evaluated:Patient Re-evaluated prior to induction Oxygen Delivery Method: Circle system utilized Preoxygenation: Pre-oxygenation with 100% oxygen Induction Type: IV induction Ventilation: Mask ventilation without difficulty Laryngoscope Size: Miller and 3 Grade View: Grade II Tube type: Oral Tube size: 7.5 mm Number of attempts: 1 Airway Equipment and Method: Stylet Placement Confirmation: ETT inserted through vocal cords under direct vision, breath sounds checked- equal and bilateral and positive ETCO2 Secured at: 24 cm Tube secured with: Tape Dental Injury: Teeth and Oropharynx as per pre-operative assessment  Comments: Oral airway placed after DL, intubation for bite block.

## 2022-11-12 NOTE — Telephone Encounter (Signed)
Pt was prescribed this medication today by Dr. [name]

## 2022-11-12 NOTE — Progress Notes (Signed)
Patient states Tramadol doesn't work for him, Called Dr. Alyson Ingles in the OR to inform him of this.  He states he will change his d/c pain med to something else.  Post op was made aware.

## 2022-11-12 NOTE — H&P (Signed)
Urology Admission H&P  Chief Complaint: bladder cancer  History of Present Illness: [name] is a [id number] with recurrent muscle invasive bladder. He was found to have a recurrance on office cystoscopy and presents today to bladder tumor resection. No significant LUTS. He has intermittent gross hematuria  Past Medical History:  Diagnosis Date   Abscess    Right buttocks   Bladder tumor    Depression    Son passed 2014   Ear infection    Eating disorder    Frequent headaches    GERD (gastroesophageal reflux disease)    Helicobacter pylori ab+ 10/27/2017   Hepatitis    childhood, jaundice   Migraines    Urinary incontinence    Varicose vein of leg    Right leg   Vitamin D deficiency    Past Surgical History:  Procedure Laterality Date   CYSTOSCOPY W/ URETERAL STENT PLACEMENT Right 11/19/2017   Procedure: CYSTOSCOPY WITH RETROGRADE PYELOGRAM/URETERAL STENT PLACEMENT;  Surgeon: Ceasar Mons, MD;  Location: [facility];  Service: Urology;  Laterality: Right;   CYSTOSCOPY WITH FULGERATION N/A 11/19/2017   Procedure: [place] AND CLOT EVACUATION;  Surgeon: Nickie Retort, MD;  Location: [facility];  Service: Urology;  Laterality: N/A;   IR NEPHROSTOMY EXCHANGE RIGHT  07/20/2022   IR NEPHROSTOMY EXCHANGE RIGHT  10/06/2022   IR NEPHROSTOMY PLACEMENT RIGHT  05/26/2022   TRANSURETHRAL RESECTION OF BLADDER TUMOR N/A 11/19/2017   Procedure: TRANSURETHRAL RESECTION OF BLADDER TUMOR (TURBT);  Surgeon: Ceasar Mons, MD;  Location: [facility];  Service: Urology;  Laterality: N/A;   TRANSURETHRAL RESECTION OF BLADDER TUMOR N/A 05/18/2022   Procedure: TRANSURETHRAL RESECTION OF BLADDER TUMOR (TURBT);  Surgeon: Cleon Gustin, MD;  Location: [facility];  Service: Urology;  Laterality: N/A;    Home Medications:  Current Facility-Administered Medications  Medication Dose Route Frequency Provider Last Rate Last Admin   ceFAZolin  (ANCEF) 2-4 GM/100ML-% IVPB            ceFAZolin (ANCEF) IVPB 2g/100 mL premix  2 g Intravenous 30 min Pre-Op Raul Torrance, Candee Furbish, MD       Allergies:  Allergies  Allergen Reactions   Macrobid [Nitrofurantoin]     ?epig abd pain    Family History  Problem Relation Age of Onset   Cancer Mother    Depression Mother    Arthritis Father    Depression Father    Hearing loss Father    Depression Daughter    Early death Son    Social History:  reports that he has been smoking cigarettes. He has a 30.00 pack-year smoking history. He has never used smokeless tobacco. He reports that he does not drink alcohol and does not use drugs.  Review of Systems  All other systems reviewed and are negative.   Physical Exam:  Vital signs in last 24 hours: Temp:  [98.1 F (36.7 C)] 98.1 F (36.7 C) (02/15 0700) Pulse Rate:  [80-84] 82 (02/15 0745) Resp:  [15-18] 17 (02/15 0745) BP: (113-122)/(74-83) 115/74 (02/15 0745) SpO2:  [100 %] 100 % (02/15 0700) Physical Exam Vitals reviewed.  Constitutional:      Appearance: Normal appearance.  HENT:     Head: Normocephalic and atraumatic.     Nose: Nose normal. No congestion.     Mouth/Throat:     Mouth: Mucous membranes are dry.  Eyes:     Extraocular Movements: Extraocular movements intact.     Pupils: Pupils are  equal, round, and reactive to light.  Cardiovascular:     Rate and Rhythm: Normal rate and regular rhythm.  Pulmonary:     Effort: Pulmonary effort is normal. No respiratory distress.  Abdominal:     General: There is no distension.  Musculoskeletal:        General: No swelling. Normal range of motion.     Cervical back: Normal range of motion and neck supple.  Skin:    General: Skin is warm and dry.  Neurological:     General: No focal deficit present.     Mental Status: He is alert and oriented to person, place, and time.  Psychiatric:        Mood and Affect: Mood normal.        Behavior: Behavior normal.        Thought  Content: Thought content normal.        Judgment: Judgment normal.     Laboratory Data:  No results found for this or any previous visit (from the past 24 hour(s)). No results found for this or any previous visit (from the past 240 hour(s)). Creatinine: No results for input(s): "CREATININE" in the last 168 hours. Baseline Creatinine:   Impression/Assessment:    Plan:  We discussed the management of bladder tumors including transurethral resection. AFter discussing the procedure the patient wishes to pursue therapy. Risks/benefits/alternatives discussed    [name] 11/12/2022, 8:07 AM

## 2022-11-13 LAB — SURGICAL PATHOLOGY

## 2022-11-13 NOTE — Anesthesia Postprocedure Evaluation (Signed)
Anesthesia Post Note  Patient: Paola E Bieser  Procedure(s) Performed: CYSTOSCOPY WITH RETROGRADE PYELOGRAM (Bilateral: Renal) TRANSURETHRAL RESECTION OF BLADDER TUMOR (TURBT) (Bladder)  Patient location during evaluation: Phase II Anesthesia Type: General Level of consciousness: awake Pain management: pain level controlled Vital Signs Assessment: post-procedure vital signs reviewed and stable Respiratory status: spontaneous breathing and respiratory function stable Cardiovascular status: blood pressure returned to baseline and stable Postop Assessment: no headache and no apparent nausea or vomiting Anesthetic complications: no Comments: Late entry   No notable events documented.   Last Vitals:  Vitals:   11/12/22 1042 11/12/22 1048  BP: 127/76 125/72  Pulse: 74 77  Resp: 12 16  Temp:  36.4 C  SpO2: 97% 98%    Last Pain:  Vitals:   11/12/22 1048  TempSrc: Oral  PainSc: [place]

## 2022-11-17 ENCOUNTER — Ambulatory Visit (INDEPENDENT_AMBULATORY_CARE_PROVIDER_SITE_OTHER): Payer: 59 | Admitting: Internal Medicine

## 2022-11-17 ENCOUNTER — Other Ambulatory Visit: Payer: Self-pay | Admitting: Internal Medicine

## 2022-11-17 ENCOUNTER — Encounter (HOSPITAL_COMMUNITY): Payer: Self-pay | Admitting: Urology

## 2022-11-17 VITALS — BP 110/62 | HR 91 | Temp 98.1°F | Ht 75.0 in | Wt 227.0 lb

## 2022-11-17 DIAGNOSIS — R5382 Chronic fatigue, unspecified: Secondary | ICD-10-CM

## 2022-11-17 DIAGNOSIS — E559 Vitamin D deficiency, unspecified: Secondary | ICD-10-CM | POA: Diagnosis not present

## 2022-11-17 DIAGNOSIS — C689 Malignant neoplasm of urinary organ, unspecified: Secondary | ICD-10-CM

## 2022-11-17 DIAGNOSIS — D494 Neoplasm of unspecified behavior of bladder: Secondary | ICD-10-CM

## 2022-11-17 DIAGNOSIS — C672 Malignant neoplasm of lateral wall of bladder: Secondary | ICD-10-CM

## 2022-11-17 LAB — COMPREHENSIVE METABOLIC PANEL
ALT: 12 U/L (ref 0–53)
AST: 11 U/L (ref 0–37)
Albumin: 4.5 g/dL (ref 3.5–5.2)
Alkaline Phosphatase: 61 U/L (ref 39–117)
BUN: 17 mg/dL (ref 6–23)
CO2: 29 mEq/L (ref 19–32)
Calcium: 10.1 mg/dL (ref 8.4–10.5)
Chloride: 102 mEq/L (ref 96–112)
Creatinine, Ser: 0.96 mg/dL (ref 0.40–1.50)
GFR: 87.64 mL/min (ref >60.00)
Glucose, Bld: 90 mg/dL (ref 70–99)
Potassium: 4.4 mEq/L (ref 3.5–5.1)
Sodium: 137 mEq/L (ref 135–145)
Total Bilirubin: 0.4 mg/dL (ref 0.2–1.2)
Total Protein: 7.4 g/dL (ref 6.0–8.3)

## 2022-11-17 LAB — CBC WITH DIFFERENTIAL/PLATELET
Basophils Absolute: 0 10*3/uL (ref 0.0–0.1)
Basophils Relative: 0.4 % (ref 0.0–3.0)
Eosinophils Absolute: 0.3 10*3/uL (ref 0.0–0.7)
Eosinophils Relative: 2.2 % (ref 0.0–5.0)
HCT: 46.3 % (ref 39.0–52.0)
Hemoglobin: 15.5 g/dL (ref 13.0–17.0)
Lymphocytes Relative: 23.8 % (ref 12.0–46.0)
Lymphs Abs: 2.7 10*3/uL (ref 0.7–4.0)
MCHC: 33.5 g/dL (ref 30.0–36.0)
MCV: 89 fl (ref 78.0–100.0)
Monocytes Absolute: 0.6 10*3/uL (ref 0.1–1.0)
Monocytes Relative: 5.3 % (ref 3.0–12.0)
Neutro Abs: 7.8 10*3/uL — ABNORMAL HIGH (ref 1.4–7.7)
Neutrophils Relative %: 68.3 % (ref 43.0–77.0)
Platelets: 220 10*3/uL (ref 150.0–400.0)
RBC: 5.2 Mil/uL (ref 4.22–5.81)
RDW: 14.3 % (ref 11.5–15.5)
WBC: 11.5 10*3/uL — ABNORMAL HIGH (ref 4.0–10.5)

## 2022-11-17 LAB — TSH: TSH: 0.7 u[IU]/mL (ref 0.35–5.50)

## 2022-11-17 LAB — VITAMIN D 25 HYDROXY (VIT D DEFICIENCY, FRACTURES): VITD: 26.8 ng/mL — ABNORMAL LOW (ref 30.00–100.00)

## 2022-11-17 MED ORDER — VITAMIN D (ERGOCALCIFEROL) 1.25 MG (50000 UNIT) PO CAPS: 50000.0000 [IU] | ORAL_CAPSULE | ORAL | 0 refills | Status: DC

## 2022-11-17 MED ORDER — AMOXICILLIN 500 MG PO CAPS: 1000.0000 mg | ORAL_CAPSULE | Freq: Two times a day (BID) | ORAL | 0 refills | Status: DC

## 2022-11-17 MED ORDER — OXYCODONE-ACETAMINOPHEN 5-325 MG PO TABS: 1.0000 | ORAL_TABLET | ORAL | 0 refills | Status: DC | PRN

## 2022-11-17 MED ORDER — VITAMIN D3 50 MCG (2000 UT) PO CAPS: 2000.0000 [IU] | ORAL_CAPSULE | Freq: Every day | ORAL | 3 refills | Status: AC

## 2022-11-17 NOTE — Progress Notes (Signed)
Subjective:  Patient ID: Christian Kennedy, male    DOB: 01-25-1966  Age: 57 y.o. MRN: [id number]  CC: referral urologist (Pt would like referral urologist)   HPI Christian Kennedy presents for bladder cancer, postop abdominal pain  He is s/p :  "2. Left retrograde pyelography 3.  Intraoperative fluoroscopy, under one hour, with interpretation 4.  Transurethral resection of bladder tumor, large by  Rosie Fate" on 11/12/2022  Christian Kennedy wants to see Dr Verdell Carmine (Oncology) in [place] for the second opinion.  He is unable to make up his mind about radiation and chemotherapy.  Outpatient Medications Prior to Visit  Medication Sig Dispense Refill   b complex vitamins capsule Take 1 capsule by mouth daily.     BLACK CURRANT SEED OIL PO Take 1 capsule by mouth daily.     Cyanocobalamin (B-12 PO) Take 1 tablet by mouth daily.     nicotine (NICODERM CQ - DOSED IN MG/24 HOURS) 14 mg/24hr patch Place 1 patch (14 mg total) onto the skin daily. 30 patch 0   sodium chloride flush (NS) 0.9 % SOLN 10 ml syr use as directed 50 Syringe 5   traMADol (ULTRAM) 50 MG tablet Take 1 tablet (50 mg total) by mouth every 8 (eight) hours as needed for moderate pain. 30 tablet 0   zolpidem (AMBIEN) 10 MG tablet Take 0.5-1 tablets (5-10 mg total) by mouth at bedtime as needed for sleep. 30 tablet 3   oxyCODONE-acetaminophen (PERCOCET) 5-325 MG tablet Take 1 tablet by mouth every 4 (four) hours as needed for severe pain. 30 tablet 0   Vitamin D, Ergocalciferol, (DRISDOL) 1.25 MG (50000 UNIT) CAPS capsule Take 1 capsule (50,000 Units total) by mouth every 7 (seven) days. (Patient not taking: Reported on 11/17/2022) 8 capsule 0   No facility-administered medications prior to visit.    ROS: Review of Systems  Constitutional:  Negative for appetite change, fatigue and unexpected weight change.  HENT:  Negative for congestion, nosebleeds, sneezing, sore throat and trouble swallowing.   Eyes:  Negative  for itching and visual disturbance.  Respiratory:  Negative for cough.   Cardiovascular:  Negative for chest pain, palpitations and leg swelling.  Gastrointestinal:  Positive for abdominal pain. Negative for abdominal distention, blood in stool, diarrhea and nausea.  Genitourinary:  Negative for frequency and hematuria.  Musculoskeletal:  Negative for back pain, gait problem, joint swelling and neck pain.  Skin:  Negative for rash.  Neurological:  Negative for dizziness, tremors, speech difficulty and weakness.  Psychiatric/Behavioral:  Negative for agitation, dysphoric mood and sleep disturbance. The patient is not nervous/anxious.     Objective:  BP 110/62 (BP Location: Left Arm)   Pulse 91   Temp 98.1 F (36.7 C) (Oral)   Ht 6' 3"$  (1.905 m)   Wt 227 lb (103 kg)   SpO2 97%   BMI 28.37 kg/m   BP Readings from Last 3 Encounters:  11/17/22 110/62  11/12/22 125/72  10/08/22 (!) 140/102    Wt Readings from Last 3 Encounters:  11/17/22 227 lb (103 kg)  11/09/22 224 lb 13.9 oz (102 kg)  10/08/22 224 lb 14.4 oz (102 kg)    Physical Exam Constitutional:      General: He is not in acute distress.    Appearance: He is well-developed.     Comments: NAD  Eyes:     Conjunctiva/sclera: Conjunctivae normal.     Pupils: Pupils are equal, round, and reactive to light.  Neck:  Thyroid: No thyromegaly.     Vascular: No JVD.  Cardiovascular:     Rate and Rhythm: Normal rate and regular rhythm.     Heart sounds: Normal heart sounds. No murmur heard.    No friction rub. No gallop.  Pulmonary:     Effort: Pulmonary effort is normal. No respiratory distress.     Breath sounds: Normal breath sounds. No wheezing or rales.  Chest:     Chest wall: No tenderness.  Abdominal:     General: Bowel sounds are normal. There is no distension.     Palpations: Abdomen is soft. There is no mass.     Tenderness: There is no abdominal tenderness. There is no guarding or rebound.   Musculoskeletal:        General: No tenderness. Normal range of motion.     Cervical back: Normal range of motion.  Lymphadenopathy:     Cervical: No cervical adenopathy.  Skin:    General: Skin is warm and dry.     Findings: No rash.  Neurological:     Mental Status: He is alert and oriented to person, place, and time.     Cranial Nerves: No cranial nerve deficit.     Motor: No abnormal muscle tone.     Coordination: Coordination normal.     Gait: Gait normal.     Deep Tendon Reflexes: Reflexes are normal and symmetric.  Psychiatric:        Behavior: Behavior normal.        Thought Content: Thought content normal.        Judgment: Judgment normal.   Abd is sensitive.  Christian Kennedy looks well today. Drain Foley Urine is clear    A total time of 45 minutes was spent preparing to see the patient, reviewing tests, x-rays, operative reports and other medical records.  Also, obtaining history and performing comprehensive physical exam.  Additionally, counseling the patient regarding the above listed issues: bladder cancer, need for chemotherapy and radiation.   Finally, documenting clinical information in the health records, coordination of care, educating the patient.  AP   Lab Results  Component Value Date   WBC 14.2 (H) 09/30/2022   HGB 15.6 09/30/2022   HCT 47.2 09/30/2022   PLT 194 09/30/2022   GLUCOSE 97 09/30/2022   CHOL 200 10/08/2021   TRIG 248.0 (H) 10/08/2021   HDL 34.60 (L) 10/08/2021   LDLDIRECT 132.0 10/08/2021   ALT 13 09/30/2022   AST 14 (L) 09/30/2022   NA 137 09/30/2022   K 4.2 09/30/2022   CL 101 09/30/2022   CREATININE 0.95 09/30/2022   BUN 19 09/30/2022   CO2 26 09/30/2022   TSH 0.51 07/07/2022   PSA 1.18 10/08/2021   INR 1.1 08/12/2022    DG C-Arm 1-60 Min-No Report  Result Date: 11/12/2022 Fluoroscopy was utilized by the requesting physician.  No radiographic interpretation.    Assessment & Plan:   Problem List Items Addressed This Visit        Genitourinary   Urothelial cancer ([place]) - Primary     Will refer Christian Kennedy  to see Dr Verdell Carmine (Oncology) in [place] for the second opinion.  He is unable to make up his mind about radiation and chemotherapy. Currently he is s/p :  "2. Left retrograde pyelography 3.  Intraoperative fluoroscopy, under one hour, with interpretation 4.  Transurethral resection of bladder tumor, large by  Rosie Fate" on 11/12/2022. I renewed his Percocet for pain.  I gave him  a prescription for ampicillin in case he develops a UTI.       Relevant Medications   amoxicillin (AMOXIL) 500 MG capsule   Other Relevant Orders   Ambulatory referral to Hematology / Oncology   CBC with Differential/Platelet   Comprehensive metabolic panel   TSH   VITAMIN D 25 Hydroxy (Vit-D Deficiency, Fractures)   Malignant neoplasm of lateral wall of urinary bladder ([place])     Will refer Christian Kennedy  to see Dr Verdell Carmine (Oncology) in [place] for the second opinion.  He is unable to make up his mind about radiation and chemotherapy. Currently he is s/p :  "2. Left retrograde pyelography 3.  Intraoperative fluoroscopy, under one hour, with interpretation 4.  Transurethral resection of bladder tumor, large by  Rosie Fate" on 11/12/2022. I renewed his Percocet for pain.  I gave him a prescription for ampicillin in case he develops a UTI.       Relevant Medications   amoxicillin (AMOXIL) 500 MG capsule   Bladder tumor    Will refer Christian Kennedy  to see Dr Verdell Carmine (Oncology) in [place] for the second opinion.  He is unable to make up his mind about radiation and chemotherapy. Currently he is s/p :  "2. Left retrograde pyelography 3.  Intraoperative fluoroscopy, under one hour, with interpretation 4.  Transurethral resection of bladder tumor, large by  Rosie Fate" on 11/12/2022. I renewed his Percocet for pain.  I gave him a prescription for ampicillin in case he develops a UTI.         Other   Vitamin D deficiency   Relevant Orders   VITAMIN D 25 Hydroxy (Vit-D Deficiency, Fractures)   Fatigue   Relevant Orders   CBC with Differential/Platelet   Comprehensive metabolic panel   TSH      Meds ordered this encounter  Medications   oxyCODONE-acetaminophen (PERCOCET) 5-325 MG tablet    Sig: Take 1 tablet by mouth every 4 (four) hours as needed for severe pain.    Dispense:  30 tablet    Refill:  0   amoxicillin (AMOXIL) 500 MG capsule    Sig: Take 2 capsules (1,000 mg total) by mouth 2 (two) times daily.    Dispense:  40 capsule    Refill:  0      Follow-up: Return in about 3 months (around 02/15/2023) for a follow-up visit.  Walker Kehr, MD

## 2022-11-17 NOTE — Assessment & Plan Note (Signed)
  Will refer Christian Kennedy  to see Dr Verdell Carmine (Oncology) in [place] for the second opinion.  He is unable to make up his mind about radiation and chemotherapy. Currently he is s/p :  "2. Left retrograde pyelography 3.  Intraoperative fluoroscopy, under one hour, with interpretation 4.  Transurethral resection of bladder tumor, large by  Rosie Fate" on 11/12/2022. I renewed his Percocet for pain.  I gave him a prescription for ampicillin in case he develops a UTI.

## 2022-11-17 NOTE — Assessment & Plan Note (Signed)
Will refer Christian Kennedy  to see Dr Verdell Carmine (Oncology) in [place] for the second opinion.  He is unable to make up his mind about radiation and chemotherapy. Currently he is s/p :  "2. Left retrograde pyelography 3.  Intraoperative fluoroscopy, under one hour, with interpretation 4.  Transurethral resection of bladder tumor, large by  Rosie Fate" on 11/12/2022. I renewed his Percocet for pain.  I gave him a prescription for ampicillin in case he develops a UTI.

## 2022-11-17 NOTE — Assessment & Plan Note (Signed)
  Will refer Christian Kennedy  to see Dr Verdell Carmine (Oncology) in [place] for the second opinion.  He is unable to make up his mind about radiation and chemotherapy. Currently he is s/p :  "2. Left retrograde pyelography 3.  Intraoperative fluoroscopy, under one hour, with interpretation 4.  Transurethral resection of bladder tumor, large by  Rosie Fate" on 11/12/2022. I renewed his Percocet for pain.  I gave him a prescription for ampicillin in case he develops a UTI.

## 2022-11-20 ENCOUNTER — Encounter: Payer: Self-pay | Admitting: Urology

## 2022-11-20 ENCOUNTER — Ambulatory Visit (INDEPENDENT_AMBULATORY_CARE_PROVIDER_SITE_OTHER): Payer: 59 | Admitting: Urology

## 2022-11-20 VITALS — BP 115/68 | HR 94

## 2022-11-20 DIAGNOSIS — C672 Malignant neoplasm of lateral wall of bladder: Secondary | ICD-10-CM | POA: Diagnosis not present

## 2022-11-20 NOTE — Patient Instructions (Signed)

## 2022-11-20 NOTE — Progress Notes (Signed)
Fill and Pull Catheter Removal  Patient is present today for a catheter removal.  Patient was cleaned and prepped in a sterile fashion 253m of sterile water/ saline was instilled into the bladder when the patient felt the urge to urinate. 343mof water was then drained from the balloon.  A 22FR foley cath was removed from the bladder no complications were noted .  Patient as then given some time to void on their own.  Patient can void  25010mn their own after some time.  Patient tolerated well.  Performed by: KouLevi AlandMA  Follow up/ Additional notes: Follow up as scheduled.

## 2022-11-20 NOTE — Progress Notes (Signed)
11/20/2022 12:09 PM   Christian Kennedy [birth year] [id number]  Referring provider: Cassandria Anger, MD [place],  [place] [address]  Followup bladder cancer   HPI: [name] is a 57yo here for followup after bladder tumor resection. Pathology infiltrating high grade TCC. He has right ureteral obstruction managed with a right nephrostomy tube. Voiding trial passed today.    PMH: Past Medical History:  Diagnosis Date   Abscess    Right buttocks   Bladder tumor    Depression    Son passed 2014   Ear infection    Eating disorder    Frequent headaches    GERD (gastroesophageal reflux disease)    Helicobacter pylori ab+ 10/27/2017   Hepatitis    childhood, jaundice   Migraines    Urinary incontinence    Varicose vein of leg    Right leg   Vitamin D deficiency     Surgical History: Past Surgical History:  Procedure Laterality Date   CYSTOSCOPY W/ RETROGRADES Bilateral 11/12/2022   Procedure: CYSTOSCOPY WITH RETROGRADE PYELOGRAM;  Surgeon: Cleon Gustin, MD;  Location: [facility];  Service: Urology;  Laterality: Bilateral;   CYSTOSCOPY W/ URETERAL STENT PLACEMENT Right 11/19/2017   Procedure: CYSTOSCOPY WITH RETROGRADE PYELOGRAM/URETERAL STENT PLACEMENT;  Surgeon: Ceasar Mons, MD;  Location: [facility];  Service: Urology;  Laterality: Right;   CYSTOSCOPY WITH FULGERATION N/A 11/19/2017   Procedure: [place] AND CLOT EVACUATION;  Surgeon: Nickie Retort, MD;  Location: [facility];  Service: Urology;  Laterality: N/A;   IR NEPHROSTOMY EXCHANGE RIGHT  07/20/2022   IR NEPHROSTOMY EXCHANGE RIGHT  10/06/2022   IR NEPHROSTOMY PLACEMENT RIGHT  05/26/2022   TRANSURETHRAL RESECTION OF BLADDER TUMOR N/A 11/19/2017   Procedure: TRANSURETHRAL RESECTION OF BLADDER TUMOR (TURBT);  Surgeon: Ceasar Mons, MD;  Location: [facility];  Service: Urology;  Laterality: N/A;   TRANSURETHRAL RESECTION OF  BLADDER TUMOR N/A 05/18/2022   Procedure: TRANSURETHRAL RESECTION OF BLADDER TUMOR (TURBT);  Surgeon: Cleon Gustin, MD;  Location: [facility];  Service: Urology;  Laterality: N/A;   TRANSURETHRAL RESECTION OF BLADDER TUMOR N/A 11/12/2022   Procedure: TRANSURETHRAL RESECTION OF BLADDER TUMOR (TURBT);  Surgeon: Cleon Gustin, MD;  Location: [facility];  Service: Urology;  Laterality: N/A;    Home Medications:  Allergies as of 11/20/2022       Reactions   Macrobid [nitrofurantoin]    ?epig abd pain        Medication List        Accurate as of November 20, 2022 12:09 PM. If you have any questions, ask your nurse or doctor.          amoxicillin 500 MG capsule Commonly known as: AMOXIL Take 2 capsules (1,000 mg total) by mouth 2 (two) times daily.   b complex vitamins capsule Take 1 capsule by mouth daily.   B-12 PO Take 1 tablet by mouth daily.   BLACK CURRANT SEED OIL PO Take 1 capsule by mouth daily.   nicotine 14 mg/24hr patch Commonly known as: NICODERM CQ - dosed in mg/24 hours Place 1 patch (14 mg total) onto the skin daily.   oxyCODONE-acetaminophen 5-325 MG tablet Commonly known as: Percocet Take 1 tablet by mouth every 4 (four) hours as needed for severe pain.   sodium chloride flush 0.9 % Soln Commonly known as: NS 10 ml syr use as directed   traMADol 50 MG tablet Commonly known as:  ULTRAM Take 1 tablet (50 mg total) by mouth every 8 (eight) hours as needed for moderate pain.   Vitamin D (Ergocalciferol) 1.25 MG (50000 UNIT) Caps capsule Commonly known as: DRISDOL Take 1 capsule (50,000 Units total) by mouth every 7 (seven) days.   Vitamin D3 50 MCG (2000 UT) Caps Generic drug: Cholecalciferol Take 1 capsule (2,000 Units total) by mouth daily.   zolpidem 10 MG tablet Commonly known as: AMBIEN Take 0.5-1 tablets (5-10 mg total) by mouth at bedtime as needed for sleep.        Allergies:  Allergies  Allergen Reactions   Macrobid  [Nitrofurantoin]     ?epig abd pain    Family History: Family History  Problem Relation Age of Onset   Cancer Mother    Depression Mother    Arthritis Father    Depression Father    Hearing loss Father    Depression Daughter    Early death Son     Social History:  reports that he has been smoking cigarettes. He has a 30.00 pack-year smoking history. He has never used smokeless tobacco. He reports that he does not drink alcohol and does not use drugs.  ROS: All other review of systems were reviewed and are negative except what is noted above in HPI  Physical Exam: BP 115/68   Pulse 94   Constitutional:  Alert and oriented, No acute distress. HEENT: [place] AT, moist mucus membranes.  Trachea midline, no masses. Cardiovascular: No clubbing, cyanosis, or edema. Respiratory: Normal respiratory effort, no increased work of breathing. GI: Abdomen is soft, nontender, nondistended, no abdominal masses GU: No CVA tenderness.  Lymph: No cervical or inguinal lymphadenopathy. Skin: No rashes, bruises or suspicious lesions. Neurologic: Grossly intact, no focal deficits, moving all 4 extremities. Psychiatric: Normal mood and affect.  Laboratory Data: Lab Results  Component Value Date   WBC 11.5 (H) 11/17/2022   HGB 15.5 11/17/2022   HCT 46.3 11/17/2022   MCV 89.0 11/17/2022   PLT 220.0 11/17/2022    Lab Results  Component Value Date   CREATININE 0.96 11/17/2022    Lab Results  Component Value Date   PSA 1.18 10/08/2021   PSA 1.30 09/15/2017    Lab Results  Component Value Date   TESTOSTERONE 261.70 (L) 09/15/2017    No results found for: "HGBA1C"  Urinalysis    Component Value Date/Time   COLORURINE YELLOW 09/30/2022 2238   APPEARANCEUR Clear 10/05/2022 0842   LABSPEC 1.026 09/30/2022 2238   PHURINE 6.0 09/30/2022 2238   GLUCOSEU Negative 10/05/2022 0842   GLUCOSEU NEGATIVE 03/18/2022 0934   HGBUR MODERATE (A) 09/30/2022 2238   BILIRUBINUR Negative 10/05/2022  0842   KETONESUR 20 (A) 09/30/2022 2238   PROTEINUR Negative 10/05/2022 0842   PROTEINUR NEGATIVE 09/30/2022 2238   UROBILINOGEN 0.2 03/18/2022 0934   NITRITE Negative 10/05/2022 0842   NITRITE NEGATIVE 09/30/2022 2238   LEUKOCYTESUR Negative 10/05/2022 0842   LEUKOCYTESUR SMALL (A) 09/30/2022 2238    Lab Results  Component Value Date   LABMICR See below: 10/05/2022   WBCUA 0-5 10/05/2022   LABEPIT 0-10 10/05/2022   BACTERIA None seen 10/05/2022    Pertinent Imaging:  No results found for this or any previous visit.  No results found for this or any previous visit.  No results found for this or any previous visit.  No results found for this or any previous visit.  No results found for this or any previous visit.  No valid procedures specified.  No results found for this or any previous visit.  No results found for this or any previous visit.   Assessment & Plan:    1. Malignant neoplasm of lateral wall of urinary bladder (HCC) We discussed the management of muscle invasive bladder cancer including surveillance, radical cystectomy, bladder sparing therapy including chemotherapy and radiation. The patient is does not want to proceed with radical cystectomy or chemotherapy and radiation. He has an appointment in [place] for second opinion on therapy.  - Bladder Voiding Trial   No follow-ups on file.  [name], MD  [facility] [place]

## 2022-11-26 ENCOUNTER — Encounter: Payer: Self-pay | Admitting: Radiology

## 2022-12-01 ENCOUNTER — Other Ambulatory Visit (HOSPITAL_COMMUNITY): Payer: Self-pay | Admitting: Interventional Radiology

## 2022-12-01 ENCOUNTER — Ambulatory Visit (HOSPITAL_COMMUNITY)
Admission: RE | Admit: 2022-12-01 | Discharge: 2022-12-01 | Disposition: A | Discharge status: Home / Self Care | Payer: 59 | Source: Ambulatory Visit | Attending: Interventional Radiology | Admitting: Interventional Radiology

## 2022-12-01 DIAGNOSIS — Z436 Encounter for attention to other artificial openings of urinary tract: Secondary | ICD-10-CM | POA: Insufficient documentation

## 2022-12-01 DIAGNOSIS — C679 Malignant neoplasm of bladder, unspecified: Secondary | ICD-10-CM | POA: Diagnosis not present

## 2022-12-01 DIAGNOSIS — D494 Neoplasm of unspecified behavior of bladder: Secondary | ICD-10-CM

## 2022-12-01 DIAGNOSIS — N135 Crossing vessel and stricture of ureter without hydronephrosis: Secondary | ICD-10-CM | POA: Diagnosis not present

## 2022-12-01 HISTORY — PX: IR NEPHROSTOMY EXCHANGE RIGHT: IMG6070

## 2022-12-01 MED ORDER — IOHEXOL 300 MG/ML  SOLN
50.0000 mL | Freq: Once | INTRAMUSCULAR | Status: AC | PRN
  Administered 2022-12-01: 8 mL

## 2022-12-01 MED ORDER — LIDOCAINE HCL 1 % IJ SOLN
INTRAMUSCULAR | Status: AC
  Filled 2022-12-01: qty 20

## 2022-12-01 NOTE — Procedures (Signed)
Pre Procedure Dx: Bladder Cancer; Left sided ureteral obstruction Post Procedure Dx: Same  Successful left sided PCN exchange.    EBL: None No immediate complications.   Ronny Bacon, MD Pager #: [phone]

## 2022-12-03 ENCOUNTER — Ambulatory Visit: Payer: Self-pay | Admitting: Licensed Clinical Social Worker

## 2022-12-03 NOTE — Patient Outreach (Signed)
  Care Coordination  Follow Up Visit Note   12/03/2022 Name: TONY SCHUBRING MRN: [id number] DOB: 11/06/1965  Kathrine Haddock [place] is a 57 y.o. year old male who sees Plotnikov, Evie Lacks, MD for primary care. I spoke with  Calistro E Hiller by phone today.  What matters to the patients health and wellness today?  Getting a second option related to radiation and chemotherapy. Appointment is scheduled today.     Goals Addressed             This Visit's Progress    COMPLETED: Decrease Stress associated with finances and health concerns       Activities in order to accomplish goals.   Please review the information e-mailed on applying for disability I have provided the link to to website and contact information    We will spend more time processing your thoughts during our next phone appointment       SDOH assessments and interventions completed:  No   Care Coordination Interventions:  Yes, provided  Interventions Today    Flowsheet Row Most Recent Value  Chronic Disease   Chronic disease during today's visit Other  [Bladder tumor]  General Interventions   General Interventions Discussed/Reviewed General Interventions Reviewed  Mental Health Interventions   Mental Health Discussed/Reviewed Coping Strategies       Follow up plan: Follow up call scheduled for 12/17/22    Encounter Outcome:  Pt. Visit Completed   Casimer Lanius, [place] [phone]

## 2022-12-03 NOTE — Patient Instructions (Signed)
Visit Information  Thank you for taking time to visit with me today. Please don't hesitate to contact me if I can be of assistance to you.   Following are the goals we discussed today:   Goals Addressed             This Visit's Progress    COMPLETED: Decrease Stress associated with finances and health concerns       Activities in order to accomplish goals.   Please review the information e-mailed on applying for disability I have provided the link to to website and contact information    We will spend more time processing your thoughts during our next phone appointment        Our next appointment is by telephone on 12/17/22 at 10:00  Please call the care guide team at [phone] if you need to cancel or reschedule your appointment.   If you are experiencing a Mental Health or [place] or need someone to talk to, please call the Suicide and Crisis Lifeline: 988 call the [place] National Suicide Prevention Lifeline: 7630260685 or TTY: 848 156 8023 TTY 725-359-2606) to talk to a trained counselor call 1-800-273-TALK (toll free, 24 hour hotline) go to [facility] [address] [place] [phone])   Patient verbalizes understanding of instructions and care plan provided today and agrees to view in [place]. Active MyChart status and patient understanding of how to access instructions and care plan via MyChart confirmed with patient.     Casimer Lanius, [place] [phone]

## 2022-12-17 ENCOUNTER — Ambulatory Visit: Payer: Self-pay | Admitting: Licensed Clinical Social Worker

## 2022-12-17 NOTE — Patient Outreach (Signed)
  Care Coordination  Follow Up Visit Note   12/17/2022 Name: Christian Kennedy MRN: [id number] DOB: 09-Apr-1966  Christian Kennedy is a 57 y.o. year old male who sees Christian Kennedy, Christian Lacks, MD for primary care. I spoke with  Christian Kennedy by phone today.  What matters to the patients health and wellness today?  His health  Unable to complete call today.  Patient would like to schedule one more call with LCSW.   Goals Addressed             This Visit's Progress    COMPLETED: Decrease Stress associated with finances and health concerns       Activities in order to accomplish goals.   You have decided not to apply for disability at this time as you are still working We will spend more time processing your thoughts related to your stress during our next phone appointment       SDOH assessments and interventions completed:  No  Care Coordination Interventions:  Yes, provided  Interventions Today    Flowsheet Row Most Recent Value  General Interventions   General Interventions Discussed/Reviewed General Interventions Reviewed, Community Resources  [Disability]  [place] Reviewed       Follow up plan: Follow up call scheduled for 12/22/22    Encounter Outcome:  Pt. Visit Completed   Christian Kennedy, [place] [phone]

## 2022-12-17 NOTE — Patient Instructions (Signed)
Visit Information  Thank you for taking time to visit with me today. Please don't hesitate to contact me if I can be of assistance to you.   Following are the goals we discussed today:   Goals Addressed             This Visit's Progress    COMPLETED: Decrease Stress associated with finances and health concerns       Activities in order to accomplish goals.   You have decided not to apply for disability at this time as you are still working We will spend more time processing your thoughts related to your stress during our next phone appointment        Our next appointment is by telephone on 12/22/22 at 9:15  Please call the care guide team at [phone] if you need to cancel or reschedule your appointment.   If you are experiencing a Mental Health or [place] or need someone to talk to, please call the Suicide and Crisis Lifeline: 988 call the [place] National Suicide Prevention Lifeline: 773-170-1419 or TTY: (818)767-2428 TTY (712) 226-5582) to talk to a trained counselor call 1-800-273-TALK (toll free, 24 hour hotline)   Patient verbalizes understanding of instructions and care plan provided today and agrees to view in [place]. Active MyChart status and patient understanding of how to access instructions and care plan via MyChart confirmed with patient.     Casimer Lanius, [place] [phone]

## 2022-12-22 ENCOUNTER — Ambulatory Visit: Payer: Self-pay | Admitting: Licensed Clinical Social Worker

## 2022-12-22 NOTE — Patient Instructions (Signed)
Visit Information  Thank you for taking time to visit with me today. Please don't hesitate to contact me if I can be of assistance to you.   Following are the goals we discussed today:   Goals Addressed             This Visit's Progress    COMPLETED: Decrease Stress associated with finances and health concerns       Activities in order to accomplish goals.   You have decided not to apply for disability at this time as you are still working You have also decided not to move forward with therapy or brief interventions to manage stress        Please call the care guide team at [phone] if you need to cancel or reschedule your appointment.   If you are experiencing a Mental Health or [place] or need someone to talk to, please call the Suicide and Crisis Lifeline: 988 call the [place] National Suicide Prevention Lifeline: 856-346-9991 or TTY: 980-298-4666 TTY 3016835729) to talk to a trained counselor call 1-800-273-TALK (toll free, 24 hour hotline) call the [facility]: 2066875138   Patient verbalizes understanding of instructions and care plan provided today and agrees to view in [place]. Active MyChart status and patient understanding of how to access instructions and care plan via MyChart confirmed with patient.     No further follow up required: by Care Coordination at this time  Casimer Lanius, [place] [phone]

## 2022-12-22 NOTE — Patient Outreach (Signed)
  Care Coordination   Follow Up Visit Note   12/22/2022 Name: Christian Kennedy MRN: [id number] DOB: [birth year]  Christian Kennedy is a 57 y.o. year old male who sees Christian Kennedy, Christian Lacks, MD for primary care. I spoke with  Christian Kennedy by phone today.  What matters to the patients health and wellness today?  Reports he is doing as well as expected.  No new goals identified during this encounter   Goals Addressed             This Visit's Progress    COMPLETED: Decrease Stress associated with finances and health concerns       Activities in order to accomplish goals.   You have decided not to apply for disability at this time as you are still working You have also decided not to move forward with therapy or brief interventions to manage stress       SDOH assessments and interventions completed:  No   Care Coordination Interventions:  Yes, provided  Interventions Today    Flowsheet Row Most Recent Value  General Interventions   General Interventions Discussed/Reviewed General Interventions Reviewed, [place] Reviewed       Follow up plan: No further intervention required.   Encounter Outcome:  Pt. Visit Completed   Christian Kennedy, [place] [phone]

## 2023-01-04 ENCOUNTER — Other Ambulatory Visit: Payer: Self-pay

## 2023-01-04 DIAGNOSIS — C689 Malignant neoplasm of urinary organ, unspecified: Secondary | ICD-10-CM

## 2023-01-05 ENCOUNTER — Inpatient Hospital Stay: Payer: 59 | Attending: Hematology

## 2023-01-05 ENCOUNTER — Ambulatory Visit (HOSPITAL_COMMUNITY): Payer: 59

## 2023-01-05 DIAGNOSIS — C7989 Secondary malignant neoplasm of other specified sites: Secondary | ICD-10-CM | POA: Diagnosis not present

## 2023-01-05 DIAGNOSIS — F17210 Nicotine dependence, cigarettes, uncomplicated: Secondary | ICD-10-CM | POA: Diagnosis not present

## 2023-01-05 DIAGNOSIS — C679 Malignant neoplasm of bladder, unspecified: Secondary | ICD-10-CM | POA: Insufficient documentation

## 2023-01-05 DIAGNOSIS — C689 Malignant neoplasm of urinary organ, unspecified: Secondary | ICD-10-CM

## 2023-01-05 LAB — COMPREHENSIVE METABOLIC PANEL
ALT: 14 U/L (ref 0–44)
AST: 13 U/L — ABNORMAL LOW (ref 15–41)
Albumin: 4.1 g/dL (ref 3.5–5.0)
Alkaline Phosphatase: 59 U/L (ref 38–126)
Anion gap: 6 (ref 5–15)
BUN: 22 mg/dL — ABNORMAL HIGH (ref 6–20)
CO2: 24 mmol/L (ref 22–32)
Calcium: 8.9 mg/dL (ref 8.9–10.3)
Chloride: 104 mmol/L (ref 98–111)
Creatinine, Ser: 0.97 mg/dL (ref 0.61–1.24)
GFR, Estimated: >60 mL/min (ref >60)
Glucose, Bld: 87 mg/dL (ref 70–99)
Potassium: 4.1 mmol/L (ref 3.5–5.1)
Sodium: 134 mmol/L — ABNORMAL LOW (ref 135–145)
Total Bilirubin: 0.6 mg/dL (ref 0.3–1.2)
Total Protein: 6.7 g/dL (ref 6.5–8.1)

## 2023-01-08 ENCOUNTER — Other Ambulatory Visit: Payer: Self-pay | Admitting: Internal Medicine

## 2023-01-11 ENCOUNTER — Other Ambulatory Visit: Payer: Self-pay | Admitting: Internal Medicine

## 2023-01-11 ENCOUNTER — Other Ambulatory Visit: Payer: Self-pay | Admitting: Urology

## 2023-01-11 NOTE — Progress Notes (Signed)
[facility] [address], [place] [address]    Clinic Day:  01/12/2023  Referring physician: Tresa Garter, MD  Patient Care Team: Plotnikov, Georgina Quint, MD as PCP - General (Internal Medicine) Patient, No Pcp Per (General Practice) McKenzie, Mardene Celeste, MD as Consulting Physician (Urology) Jerilee Field, MD as Consulting Physician (Urology) Doreatha Massed, MD as Medical Oncologist (Medical Oncology) Therese Sarah, RN as Oncology Nurse Navigator (Medical Oncology)   ASSESSMENT & PLAN:   Assessment: 1.  Muscle invasive bladder cancer, stage III (T3N0): - TURBT of the right bladder tumor (11/19/2017): Invasive high-grade urothelial carcinoma involving muscularis propria - CTAP without contrast (04/17/2022): 4.2 cm polypoid mass in the right posterior bladder developing since prior study suspicious for recurrent bladder cancer.  Masses causing obstruction of right ureter with severe right hydronephrosis. - TURBT (05/18/2022): Infiltrating high-grade urothelial carcinoma involving muscularis propria.  LVI is identified. - 05/26/2022: Right nephrostomy tube placement - 40 pound weight loss in the last 6 months due to diet change, cutting of sugars/gluten and decreasing quantity of food  - CT CAP (06/15/2022): Right-sided bladder cancer involving distal right ureter.  Resolution of right hydronephrosis following percutaneous nephrostomy placement.  No evidence of metastatic disease in the chest, abdomen or pelvis. - Bone scan (06/16/2022): No evidence of metastatic disease. - He was evaluated by Dr. Berneice Heinrich and was recommended neoadjuvant chemotherapy followed by cystoprostatectomy/conduit diversion (not candidate for continent diversion as stage III). - We discussed the role of neoadjuvant cisplatin based combination chemotherapy.  We discussed side effects also.  Patient reports that he is been doing a lot of prayer and is not interested in taking  chemotherapy at this time.   2.  Social/family history: - He lives at home with his wife.  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  He had exposure to oil-based additives, tires seals and other chemicals.  He is a current active smoker, 1 pack/day for the last 37 years. - Mother had colon cancer.    Plan: 1.  Stage III (T3N0) high-grade urothelial carcinoma of the right bladder: - TURBT on 11/12/2022: Infiltrating high-grade papillary urothelial carcinoma invading muscularis propria. - Bone scan (12/15/2022): Tiny focal uptake in the region of left costochondral cartilage, posttraumatic.  No distant metastatic disease. - CT CAP (12/15/2022): Tiny nonspecific right middle lobe nodule versus fissural lymph node, 4 mm.  Mild right hydroureteronephrosis with right percutaneous nephrostomy tube in place.  Possible soft tissue density in the right distal ureteral which could indicate neoplasm.  Right-sided bladder wall thickening which could reflect residual neoplasm/postsurgical changes.  Prominent but not enlarged right external iliac lymph node measuring 1 x 0.7 cm. - We have again discussed neoadjuvant chemotherapy followed by cystectomy versus bladder sparing approach with combination chemoradiation therapy with TURBT.  He is not interested at this time. - He started taking ivermectin and flubendazole 2 days ago.  He plans to take it for 20 days.  He has heard about anticancer properties of the elbow drugs. - He is open to targeted treatments.  We have recommended NGS panel on the most recent TURBT sample. - I will look into clinical trial options in this setting by reaching out to my colleagues at [facility].  Otherwise he will come back in 3 months with repeat CT CAP with contrast.  Orders Placed This Encounter  Procedures   CT CHEST ABDOMEN PELVIS W CONTRAST    Standing  Status:   Future    Standing Expiration Date:   01/12/2024    Order Specific  Question:   If indicated for the ordered procedure, I authorize the administration of contrast media per Radiology protocol    Answer:   Yes    Order Specific Question:   Does the patient have a contrast media/X-ray dye allergy?    Answer:   No    Order Specific Question:   Preferred imaging location?    Answer:   [facility]    Order Specific Question:   Release to patient    Answer:   Immediate    Order Specific Question:   Is Oral Contrast requested for this exam?    Answer:   Yes, Per Radiology protocol   CBC with Differential/Platelet    Standing Status:   Future    Standing Expiration Date:   01/12/2024    Order Specific Question:   Release to patient    Answer:   Immediate   Comprehensive metabolic panel    Standing Status:   Future    Standing Expiration Date:   01/12/2024    Order Specific Question:   Release to patient    Answer:   Immediate      I,Alexis Herring,acting as a scribe for Doreatha Massed, MD.,have documented all relevant documentation on the behalf of Doreatha Massed, MD,as directed by  Doreatha Massed, MD while in the presence of Doreatha Massed, MD.   I, Doreatha Massed MD, have reviewed the above documentation for accuracy and completeness, and I agree with the above.   Doreatha Massed, MD   4/16/20246:37 PM  CHIEF COMPLAINT:   Diagnosis: high-grade urothelial bladder cancer    Cancer Staging  Bladder tumor Staging form: Urinary Bladder, AJCC 8th Edition - Clinical stage from 06/03/2022: Stage IIIA (cT3, cN0, cM0) - Unsigned    Prior Therapy: TURBT in 2019 and 2023   Current Therapy:  surveillance   HISTORY OF PRESENT ILLNESS:   Oncology History   No history exists.     INTERVAL HISTORY:   Christian Kennedy is a 57 y.o. male presenting to clinic today for follow up of high-grade urothelial bladder cancer. He was last seen by me on 10/08/22.  He underwent a TURBT on 11/12/22 with Dr. Wilkie Aye. Biopsy showed  Infiltrating high grade papillary urothelial carcinoma.  Today, he states that he is doing well overall. His appetite level is at 100%. His energy level is at 75%.  PAST MEDICAL HISTORY:   Past Medical History: Past Medical History:  Diagnosis Date   Abscess    Right buttocks   Bladder tumor    Depression    Son passed 2014   Ear infection    Eating disorder    Frequent headaches    GERD (gastroesophageal reflux disease)    Helicobacter pylori ab+ 10/27/2017   Hepatitis    childhood, jaundice   Migraines    Urinary incontinence    Varicose vein of leg    Right leg   Vitamin D deficiency     Surgical History: Past Surgical History:  Procedure Laterality Date   CYSTOSCOPY W/ RETROGRADES Bilateral 11/12/2022   Procedure: CYSTOSCOPY WITH RETROGRADE PYELOGRAM;  Surgeon: Malen Gauze, MD;  Location: [facility];  Service: Urology;  Laterality: Bilateral;   CYSTOSCOPY W/ URETERAL STENT PLACEMENT Right 11/19/2017   Procedure: CYSTOSCOPY WITH RETROGRADE PYELOGRAM/URETERAL STENT PLACEMENT;  Surgeon: Rene Paci, MD;  Location: [facility];  Service: Urology;  Laterality: Right;   CYSTOSCOPY WITH FULGERATION N/A 11/19/2017   Procedure: CYSTOSCOPY WITH FULGERATION AND CLOT EVACUATION;  Surgeon: Hildred Laser, MD;  Location: [facility];  Service: Urology;  Laterality: N/A;   IR NEPHROSTOMY EXCHANGE RIGHT  07/20/2022   IR NEPHROSTOMY EXCHANGE RIGHT  10/06/2022   IR NEPHROSTOMY EXCHANGE RIGHT  12/01/2022   IR NEPHROSTOMY PLACEMENT RIGHT  05/26/2022   TRANSURETHRAL RESECTION OF BLADDER TUMOR N/A 11/19/2017   Procedure: TRANSURETHRAL RESECTION OF BLADDER TUMOR (TURBT);  Surgeon: Rene Paci, MD;  Location: [facility];  Service: Urology;  Laterality: N/A;   TRANSURETHRAL RESECTION OF BLADDER TUMOR N/A 05/18/2022   Procedure: TRANSURETHRAL RESECTION OF BLADDER TUMOR (TURBT);  Surgeon: Malen Gauze, MD;  Location: [facility];  Service:  Urology;  Laterality: N/A;   TRANSURETHRAL RESECTION OF BLADDER TUMOR N/A 11/12/2022   Procedure: TRANSURETHRAL RESECTION OF BLADDER TUMOR (TURBT);  Surgeon: Malen Gauze, MD;  Location: [facility];  Service: Urology;  Laterality: N/A;    Social History: Social History   Socioeconomic History   Marital status: Married    Spouse name: Not on file   Number of children: Not on file   Years of education: Not on file   Highest education level: Not on file  Occupational History   Not on file  Tobacco Use   Smoking status: Every Day    Packs/day: 1.00    Years: 30.00    Additional pack years: 0.00    Total pack years: 30.00    Types: Cigarettes   Smokeless tobacco: Never  Vaping Use   Vaping Use: Never used  Substance and Sexual Activity   Alcohol use: Never   Drug use: No   Sexual activity: Not on file  Other Topics Concern   Not on file  Social History Narrative   Not on file   Social Determinants of Health   Financial Resource Strain: Not on file  Food Insecurity: No Food Insecurity (10/07/2022)   Hunger Vital Sign    Worried About Running Out of Food in the Last Year: Never true    Ran Out of Food in the Last Year: Never true  Transportation Needs: No Transportation Needs (10/07/2022)   PRAPARE - Administrator, Civil Service (Medical): No    Lack of Transportation (Non-Medical): No  Physical Activity: Not on file  Stress: Stress Concern Present (10/20/2022)   [employer] of Occupational Health - Occupational Stress Questionnaire    Feeling of Stress : Rather much  Social Connections: Not on file  Intimate Partner Violence: Not At Risk (08/12/2022)   Humiliation, Afraid, Rape, and Kick questionnaire    Fear of Current or Ex-Partner: No    Emotionally Abused: No    Physically Abused: No    Sexually Abused: No    Family History: Family History  Problem Relation Age of Onset   Cancer Mother    Depression Mother    Arthritis Father     Depression Father    Hearing loss Father    Depression Daughter    Early death Son     Current Medications:  Current Outpatient Medications:    amoxicillin (AMOXIL) 500 MG capsule, Take 2 capsules (1,000 mg total) by mouth 2 (two) times daily., Disp: 40 capsule, Rfl: 0   b complex vitamins capsule, Take 1 capsule by mouth daily., Disp: , Rfl:    BLACK CURRANT SEED OIL PO, Take 1 capsule by mouth daily., Disp: , Rfl:  Cholecalciferol (VITAMIN D3) 50 MCG (2000 UT) CAPS, Take 1 capsule (2,000 Units total) by mouth daily., Disp: 100 capsule, Rfl: 3   Cyanocobalamin (B-12 PO), Take 1 tablet by mouth daily., Disp: , Rfl:    nicotine (NICODERM CQ - DOSED IN MG/24 HOURS) 14 mg/24hr patch, Place 1 patch (14 mg total) onto the skin daily., Disp: 30 patch, Rfl: 0   oxyCODONE-acetaminophen (PERCOCET) 5-325 MG tablet, Take 1 tablet by mouth every 4 (four) hours as needed for severe pain., Disp: 30 tablet, Rfl: 0   sodium chloride flush (NS) 0.9 % SOLN, 10 ml syr use as directed, Disp: 50 Syringe, Rfl: 5   traMADol (ULTRAM) 50 MG tablet, Take 1 tablet (50 mg total) by mouth every 8 (eight) hours as needed for moderate pain., Disp: 30 tablet, Rfl: 0   Vitamin D, Ergocalciferol, (DRISDOL) 1.25 MG (50000 UNIT) CAPS capsule, Take 1 capsule (50,000 Units total) by mouth every 7 (seven) days., Disp: 8 capsule, Rfl: 0   zolpidem (AMBIEN) 10 MG tablet, Take 0.5-1 tablets (5-10 mg total) by mouth at bedtime as needed for sleep., Disp: 30 tablet, Rfl: 3   Allergies: Allergies  Allergen Reactions   Macrobid [Nitrofurantoin]     ?epig abd pain    REVIEW OF SYSTEMS:   Review of Systems  Constitutional:  Negative for chills, fatigue and fever.  HENT:   Negative for lump/mass, mouth sores, nosebleeds, sore throat and trouble swallowing.   Eyes:  Negative for eye problems.  Respiratory:  Negative for cough and shortness of breath.   Cardiovascular:  Negative for chest pain, leg swelling and palpitations.   Gastrointestinal:  Negative for abdominal pain, constipation, diarrhea, nausea and vomiting.  Genitourinary:  Negative for bladder incontinence, difficulty urinating, dysuria, frequency, hematuria and nocturia.   Musculoskeletal:  Negative for arthralgias, back pain, flank pain, myalgias and neck pain.  Skin:  Negative for itching and rash.  Neurological:  Positive for headaches. Negative for dizziness and numbness.  Hematological:  Does not bruise/bleed easily.  Psychiatric/Behavioral:  Negative for depression, sleep disturbance and suicidal ideas. The patient is not nervous/anxious.   All other systems reviewed and are negative.    VITALS:   Blood pressure 121/82, pulse 83, temperature (!) 96.9 F (36.1 C), temperature source Tympanic, resp. rate 20, weight 226 lb 11.2 oz (102.8 kg), SpO2 100 %.  Wt Readings from Last 3 Encounters:  01/12/23 226 lb 11.2 oz (102.8 kg)  11/17/22 227 lb (103 kg)  11/09/22 224 lb 13.9 oz (102 kg)    Body mass index is 28.34 kg/m.  Performance status (ECOG): 0 - Asymptomatic  PHYSICAL EXAM:   Physical Exam Vitals and nursing note reviewed. Exam conducted with a chaperone present.  Constitutional:      Appearance: Normal appearance.  Cardiovascular:     Rate and Rhythm: Normal rate and regular rhythm.     Pulses: Normal pulses.     Heart sounds: Normal heart sounds.  Pulmonary:     Effort: Pulmonary effort is normal.     Breath sounds: Normal breath sounds.  Abdominal:     Palpations: Abdomen is soft. There is no hepatomegaly, splenomegaly or mass.     Tenderness: There is no abdominal tenderness.  Musculoskeletal:     Right lower leg: No edema.     Left lower leg: No edema.  Lymphadenopathy:     Cervical: No cervical adenopathy.     Right cervical: No superficial, deep or posterior cervical adenopathy.  Left cervical: No superficial, deep or posterior cervical adenopathy.     Upper Body:     Right upper body: No supraclavicular or  axillary adenopathy.     Left upper body: No supraclavicular or axillary adenopathy.  Neurological:     General: No focal deficit present.     Mental Status: He is alert and oriented to person, place, and time.  Psychiatric:        Mood and Affect: Mood normal.        Behavior: Behavior normal.     LABS:      Latest Ref Rng & Units 11/17/2022    3:42 PM 09/30/2022    9:05 AM 08/13/2022    3:29 AM  CBC  WBC 4.0 - 10.5 K/uL 11.5  14.2  7.8   Hemoglobin 13.0 - 17.0 g/dL 16.1  09.6  04.5   Hematocrit 39.0 - 52.0 % 46.3  47.2  38.4   Platelets 150.0 - 400.0 K/uL 220.0  194  167       Latest Ref Rng & Units 01/05/2023   12:04 PM 11/17/2022    3:42 PM 09/30/2022    9:05 AM  CMP  Glucose 70 - 99 mg/dL 87  90  97   BUN 6 - 20 mg/dL 22  17  19    Creatinine 0.61 - 1.24 mg/dL 4.09  8.11  9.14   Sodium 135 - 145 mmol/L 134  137  137   Potassium 3.5 - 5.1 mmol/L 4.1  4.4  4.2   Chloride 98 - 111 mmol/L 104  102  101   CO2 22 - 32 mmol/L 24  29  26    Calcium 8.9 - 10.3 mg/dL 8.9  78.2  9.4   Total Protein 6.5 - 8.1 g/dL 6.7  7.4  7.5   Total Bilirubin 0.3 - 1.2 mg/dL 0.6  0.4  0.8   Alkaline Phos 38 - 126 U/L 59  61  55   AST 15 - 41 U/L 13  11  14    ALT 0 - 44 U/L 14  12  13       No results found for: "CEA1", "CEA" / No results found for: "CEA1", "CEA" No results found for: "PSA1" No results found for: "NFA213" No results found for: "CAN125"  No results found for: "TOTALPROTELP", "ALBUMINELP", "A1GS", "A2GS", "BETS", "BETA2SER", "GAMS", "MSPIKE", "SPEI" No results found for: "TIBC", "FERRITIN", "IRONPCTSAT" No results found for: "LDH"   Pathology 11/12/22: A. BLADDER TUMOR, TURBT:  Infiltrating high grade papillary urothelial carcinoma  The carcinoma invades muscularis propria (detrusor muscle)   STUDIES:   No results found.

## 2023-01-12 ENCOUNTER — Inpatient Hospital Stay (HOSPITAL_BASED_OUTPATIENT_CLINIC_OR_DEPARTMENT_OTHER): Payer: 59 | Admitting: Hematology

## 2023-01-12 VITALS — BP 121/82 | HR 83 | Temp 96.9°F | Resp 20 | Wt 226.7 lb

## 2023-01-12 DIAGNOSIS — C689 Malignant neoplasm of urinary organ, unspecified: Secondary | ICD-10-CM | POA: Diagnosis not present

## 2023-01-12 DIAGNOSIS — C679 Malignant neoplasm of bladder, unspecified: Secondary | ICD-10-CM | POA: Diagnosis not present

## 2023-01-12 NOTE — Patient Instructions (Signed)
[facility]  Discharge Instructions  You were seen and examined today by Dr. Ellin Saba.  Dr. Ellin Saba discussed your most recent lab work and CT scan which revealed that you have a swollen lymph node in your groin area and one on your lung.  Dr. Ellin Saba is going to talk with [facility] about possible drug trial. He will reach out to you if he finds anything out about the drug trial. He will repeat CT chest, abdomen and pelvis.  Follow-up as scheduled in 3 months.    Thank you for choosing [facility] to provide your oncology and hematology care.   To afford each patient quality time with our provider, please arrive at least 15 minutes before your scheduled appointment time. You may need to reschedule your appointment if you arrive late (10 or more minutes). Arriving late affects you and other patients whose appointments are after yours.  Also, if you miss three or more appointments without notifying the office, you may be dismissed from the clinic at the provider's discretion.    Again, thank you for choosing [facility].  Our hope is that these requests will decrease the amount of time that you wait before being seen by our physicians.   If you have a lab appointment with the Cancer Center - please note that after April 8th, all labs will be drawn in the cancer center.  You do not have to check in or register with the main entrance as you have in the past but will complete your check-in at the cancer center.            _____________________________________________________________  Should you have questions after your visit to [facility], please contact our office at [phone] and follow the prompts.  Our office hours are 8:00 a.m. to 4:30 p.m. Monday - Thursday and 8:00 a.m. to 2:30 p.m. Friday.  Please note that voicemails left after 4:00 p.m. may not be returned until the following business day.   We are closed weekends and all major holidays.  You do have access to a nurse 24-7, just call the main number to the clinic [phone] and do not press any options, hold on the line and a nurse will answer the phone.    For prescription refill requests, have your pharmacy contact our office and allow 72 hours.    Masks are no longer required in the cancer centers. If you would like for your care team to wear a mask while they are taking care of you, please let them know. You may have one support person who is at least 58 years old accompany you for your appointments.

## 2023-01-13 MED ORDER — TRAMADOL HCL 50 MG PO TABS: 50.0000 mg | ORAL_TABLET | Freq: Three times a day (TID) | ORAL | 1 refills | Status: DC | PRN

## 2023-01-13 NOTE — Telephone Encounter (Signed)
Tramadol was requested and filled, therefore oxycodone was denied. No need to take prescription vitamin D.  The patient needs to get over-the-counter vitamin D3 2000 units and take 1 daily

## 2023-02-02 ENCOUNTER — Other Ambulatory Visit (HOSPITAL_COMMUNITY): Payer: Self-pay | Admitting: Interventional Radiology

## 2023-02-02 ENCOUNTER — Ambulatory Visit (HOSPITAL_COMMUNITY)
Admission: RE | Admit: 2023-02-02 | Discharge: 2023-02-02 | Disposition: A | Discharge status: Home / Self Care | Payer: 59 | Source: Ambulatory Visit | Attending: Interventional Radiology | Admitting: Interventional Radiology

## 2023-02-02 DIAGNOSIS — N1330 Unspecified hydronephrosis: Secondary | ICD-10-CM | POA: Diagnosis not present

## 2023-02-02 DIAGNOSIS — Z436 Encounter for attention to other artificial openings of urinary tract: Secondary | ICD-10-CM | POA: Insufficient documentation

## 2023-02-02 DIAGNOSIS — D494 Neoplasm of unspecified behavior of bladder: Secondary | ICD-10-CM

## 2023-02-02 DIAGNOSIS — C679 Malignant neoplasm of bladder, unspecified: Secondary | ICD-10-CM

## 2023-02-02 HISTORY — PX: IR NEPHROSTOMY EXCHANGE RIGHT: IMG6070

## 2023-02-02 MED ORDER — IOHEXOL 300 MG/ML  SOLN
50.0000 mL | Freq: Once | INTRAMUSCULAR | Status: AC | PRN
  Administered 2023-02-02: 20 mL

## 2023-02-02 MED ORDER — LIDOCAINE HCL 1 % IJ SOLN
INTRAMUSCULAR | Status: AC
  Filled 2023-02-02: qty 20

## 2023-02-02 NOTE — Procedures (Signed)
Pre Procedure Dx: Hydronephrosis due to bladder cancer Post Procedure Dx: Same  Successful left sided PCN exchange.    EBL: None No immediate complications.   Katherina Right, MD Pager #: [phone]

## 2023-02-16 ENCOUNTER — Encounter: Payer: Self-pay | Admitting: Internal Medicine

## 2023-02-16 ENCOUNTER — Ambulatory Visit (INDEPENDENT_AMBULATORY_CARE_PROVIDER_SITE_OTHER): Payer: 59 | Admitting: Internal Medicine

## 2023-02-16 VITALS — Ht 75.0 in

## 2023-02-16 DIAGNOSIS — R634 Abnormal weight loss: Secondary | ICD-10-CM | POA: Diagnosis not present

## 2023-02-16 DIAGNOSIS — E559 Vitamin D deficiency, unspecified: Secondary | ICD-10-CM | POA: Diagnosis not present

## 2023-02-16 DIAGNOSIS — D494 Neoplasm of unspecified behavior of bladder: Secondary | ICD-10-CM

## 2023-02-16 DIAGNOSIS — C689 Malignant neoplasm of urinary organ, unspecified: Secondary | ICD-10-CM

## 2023-02-16 MED ORDER — TRAMADOL HCL 50 MG PO TABS: 50.0000 mg | ORAL_TABLET | Freq: Three times a day (TID) | ORAL | 1 refills | Status: DC | PRN

## 2023-02-16 NOTE — Progress Notes (Signed)
Subjective:  Patient ID: Christian Kennedy, male    DOB: 09-28-1966  Age: 57 y.o. MRN: [id number]  CC: No chief complaint on file.   HPI Christian Kennedy presents for bladder cancer  S/p surgery - now w/R nephrostomy  Per Dr Abbe Amsterdam:  "He has a right-sided percutaneous nephrostomy tube with mild right hydronephrosis. There is right ureteral ectasis with increased density in the distal right ureter. There is mild right renal atrophy. He has a small left upper pole cyst measuring 1.3 cm in size there is thickening of the right posterior lateral bladder wall. He has a small right external iliac lymph node measuring 1 x 0.7 cm. I think that he has contained disease and could be cured with definitive chemo and radiation but he keeps talking about people that he has known who have taken chemotherapy and have died from the chemotherapy and people who have had their lives ruined by having ostomies. I am going to have one of my patients who has undergone neoadjuvant chemotherapy followed by cystectomy call and talk with him. He reportedly has spoken with someone at the [facility] in [place] about a program of treatment that does not require a cystectomy and I have told him that he he should explore that further and if he is eligible that he should ask them to see if his insurance company will cover that treatment. He hopes to spend the Easter holidays at [employer]. I have asked him to return to see me in a month with a final decision regarding treatment versus continued best supportive care until death. He has been successful cutting his tobacco consumption down to 1 or 2 a day  Oncology History Malignant neoplasm of lateral wall of urinary bladder (*) 11/12/2022 Initial Diagnosis Malignant neoplasm of lateral wall of urinary bladder (*) 1. Muscle invasive bladder cancer, stage III (T3N0): - TURBT of the right bladder tumor (11/19/2017): Invasive high-grade urothelial carcinoma involving  muscularis propria - CTAP without contrast (04/17/2022): 4.2 cm polypoid mass in the right posterior bladder developing since prior study suspicious for recurrent bladder cancer. Masses causing obstruction of right ureter with severe right hydronephrosis. - TURBT (05/18/2022): Infiltrating high-grade urothelial carcinoma involving muscularis propria. LVI is identified. - 05/26/2022: Right nephrostomy tube placement - 40 pound weight loss in the last 6 months due to diet change, cutting of sugars/gluten and decreasing quantity of food - CT CAP (06/15/2022): Right-sided bladder cancer involving distal right ureter. Resolution of right hydronephrosis following percutaneous nephrostomy placement. No evidence of metastatic disease in the chest, abdomen or pelvis. - Bone scan (06/16/2022): No evidence of metastatic disease. - He was evaluated by Dr. Berneice Heinrich and was recommended neoadjuvant chemotherapy followed by cystoprostatectomy/conduit diversion (not candidate for continent diversion as stage III). - neoadjuvant cisplatin based combination chemotherapy discussed -Patient declined recommended therapy -09/30/2022 CT abdomen pelvis revealed a right sided nephrostomy tube; chronic dilatation of the right ureter to the bladder insertion; nodular enhancement to the right urinary trigone increased in size to 3.3 x 1.5 cm; atherosclerotic disease was noted; mild enlargement of the prostate; tiny fat-containing umbilical hernia; no enlarged lymph nodes in the abdomen or pelvis -12/01/2022 IR nephrostomy tube exchange -Last saw urology 11/20/2022 and declined to consider cystectomy or chemotherapy  Impression  1. Vitamin D deficiency 2. Malignant neoplasm of lateral wall of urinary bladder (*) 3. Weight loss 4. Smoker  Plan 1. Vitamin D deficiency - CBC And Differential; Future - Comprehensive Metabolic Panel; Future -  Vitamin D 25 Hydroxy; Future - TSH; Future 2. Malignant neoplasm of lateral wall of urinary  bladder (*) - CBC And Differential; Future - Comprehensive Metabolic Panel; Future - Vitamin D 25 Hydroxy; Future - TSH; Future 3. Weight loss 4. Smoker  He is encouraged to use vitamin D3 to maintain a vitamin D level between 40 and 50 to maximize bone and muscle health. He has contained disease with the possible exception of the small lymph node and I think is still curable by neoadjuvant chemotherapy and surgery. Although he has lost weight his weight has been relatively stable since I have last seen him. He is doing his best to quit smoking understanding now that it is exacerbating his tumor. I will have one of my patients contact him to reassure him that there is a life after having ostomy and he will explore treatment options in [place] and then we will regroup in 4 weeks. Documentation for time-based billing: Total time spent of date of service was 30 minutes. Patient care activities included preparing to see the patient such as reviewing the patient record, obtaining and/or reviewing separately obtained history, performing a medically appropriate history and physical examination, counseling and educating the patient, family, and/or caregiver, ordering prescription medications, tests, or procedures, referring and communicating with other health care providers when not separately reported during the visit, documenting clinical information in the electronic or other health record, independently interpreting results when not separately reported, and communicating results to the patient/family/caregiver.  Patient voiced understanding and is in agreement with the plan. All questions were discussed.  Clearence Ped, MD 12/24/2022 / 12:49 PM Electronically signed by Clearence Ped, MD at 12/24/2022 12:55 PM EDT "    Outpatient Medications Prior to Visit  Medication Sig Dispense Refill   b complex vitamins capsule Take 1 capsule by mouth daily.     BLACK CURRANT SEED OIL PO Take 1 capsule by  mouth daily.     Cholecalciferol (VITAMIN D3) 50 MCG (2000 UT) CAPS Take 1 capsule (2,000 Units total) by mouth daily. 100 capsule 3   nicotine (NICODERM CQ - DOSED IN MG/24 HOURS) 14 mg/24hr patch Place 1 patch (14 mg total) onto the skin daily. 30 patch 0   Cyanocobalamin (B-12 PO) Take 1 tablet by mouth daily.     sodium chloride flush (NS) 0.9 % SOLN 10 ml syr use as directed (Patient not taking: Reported on 02/16/2023) 50 Syringe 5   zolpidem (AMBIEN) 10 MG tablet Take 0.5-1 tablets (5-10 mg total) by mouth at bedtime as needed for sleep. (Patient not taking: Reported on 02/16/2023) 30 tablet 3   amoxicillin (AMOXIL) 500 MG capsule Take 2 capsules (1,000 mg total) by mouth 2 (two) times daily. 40 capsule 0   oxyCODONE-acetaminophen (PERCOCET) 5-325 MG tablet Take 1 tablet by mouth every 4 (four) hours as needed for severe pain. (Patient not taking: Reported on 02/16/2023) 30 tablet 0   traMADol (ULTRAM) 50 MG tablet Take 1 tablet (50 mg total) by mouth every 8 (eight) hours as needed for moderate pain. (Patient not taking: Reported on 02/16/2023) 90 tablet 1   Vitamin D, Ergocalciferol, (DRISDOL) 1.25 MG (50000 UNIT) CAPS capsule Take 1 capsule (50,000 Units total) by mouth every 7 (seven) days. (Patient not taking: Reported on 02/16/2023) 8 capsule 0   No facility-administered medications prior to visit.    ROS: Review of Systems  Constitutional:  Negative for appetite change, fatigue and unexpected weight change.  HENT:  Negative for  congestion, nosebleeds, sneezing, sore throat and trouble swallowing.   Eyes:  Negative for itching and visual disturbance.  Respiratory:  Negative for cough.   Cardiovascular:  Negative for chest pain, palpitations and leg swelling.  Gastrointestinal:  Negative for abdominal distention, blood in stool, diarrhea and nausea.  Genitourinary:  Negative for frequency and hematuria.  Musculoskeletal:  Negative for back pain, gait problem, joint swelling and neck  pain.  Skin:  Negative for rash.  Neurological:  Negative for dizziness, tremors, speech difficulty and weakness.  Psychiatric/Behavioral:  Negative for agitation, dysphoric mood and sleep disturbance. The patient is not nervous/anxious.     Objective:  Ht 6\' 3"  (1.905 m)   BMI 28.34 kg/m   BP Readings from Last 3 Encounters:  01/12/23 121/82  11/20/22 115/68  11/17/22 110/62    Wt Readings from Last 3 Encounters:  01/12/23 226 lb 11.2 oz (102.8 kg)  11/17/22 227 lb (103 kg)  11/09/22 224 lb 13.9 oz (102 kg)    Physical Exam Constitutional:      General: He is not in acute distress.    Appearance: Normal appearance. He is well-developed.     Comments: NAD  Eyes:     Conjunctiva/sclera: Conjunctivae normal.     Pupils: Pupils are equal, round, and reactive to light.  Neck:     Thyroid: No thyromegaly.     Vascular: No JVD.  Cardiovascular:     Rate and Rhythm: Normal rate and regular rhythm.     Heart sounds: Normal heart sounds. No murmur heard.    No friction rub. No gallop.  Pulmonary:     Effort: Pulmonary effort is normal. No respiratory distress.     Breath sounds: Normal breath sounds. No wheezing or rales.  Chest:     Chest wall: No tenderness.  Abdominal:     General: Bowel sounds are normal. There is no distension.     Palpations: Abdomen is soft. There is no mass.     Tenderness: There is no abdominal tenderness. There is no guarding or rebound.  Musculoskeletal:        General: No tenderness. Normal range of motion.     Cervical back: Normal range of motion.  Lymphadenopathy:     Cervical: No cervical adenopathy.  Skin:    General: Skin is warm and dry.     Findings: No rash.  Neurological:     Mental Status: He is alert and oriented to person, place, and time.     Cranial Nerves: No cranial nerve deficit.     Motor: No abnormal muscle tone.     Coordination: Coordination normal.     Gait: Gait normal.     Deep Tendon Reflexes: Reflexes are  normal and symmetric.  Psychiatric:        Behavior: Behavior normal.        Thought Content: Thought content normal.        Judgment: Judgment normal.   R nephrostomy Pt looks well   Lab Results  Component Value Date   WBC 11.5 (H) 11/17/2022   HGB 15.5 11/17/2022   HCT 46.3 11/17/2022   PLT 220.0 11/17/2022   GLUCOSE 87 01/05/2023   CHOL 200 10/08/2021   TRIG 248.0 (H) 10/08/2021   HDL 34.60 (L) 10/08/2021   LDLDIRECT 132.0 10/08/2021   ALT 14 01/05/2023   AST 13 (L) 01/05/2023   NA 134 (L) 01/05/2023   K 4.1 01/05/2023   CL 104 01/05/2023   CREATININE 0.97  01/05/2023   BUN 22 (H) 01/05/2023   CO2 24 01/05/2023   TSH 0.70 11/17/2022   PSA 1.18 10/08/2021   INR 1.1 08/12/2022    IR NEPHROSTOMY EXCHANGE RIGHT  Result Date: 02/02/2023 INDICATION: History of bladder cancer and right-sided ureteral obstruction. Patient presents today for routine nephrostomy catheter exchange. Note, patient received a second opinion from a different urologist who also recommends total cystectomy for which the patient is considering this definitive treatment. EXAM: FLUOROSCOPIC GUIDED RIGHT SIDED NEPHROSTOMY CATHETER EXCHANGE COMPARISON:  None Available. CONTRAST:  20 cc Omnipaque 300 administered into the collecting system FLUOROSCOPY TIME:  9 mGy COMPLICATIONS: None immediate. TECHNIQUE: Informed written consent was obtained from the patient after a discussion of the risks, benefits and alternatives to treatment. Questions regarding the procedure were encouraged and answered. A timeout was performed prior to the initiation of the procedure. The right flank and external portion of existing nephrostomy catheter were prepped and draped in the usual sterile fashion. A sterile drape was applied covering the operative field. Maximum barrier sterile technique with sterile gowns and gloves were used for the procedure. A timeout was performed prior to the initiation of the procedure. A pre procedural spot  fluoroscopic image was obtained after contrast was injected via the existing nephrostomy catheter demonstrating appropriate positioning within the renal pelvis. The existing nephrostomy catheter was cut and cannulated with a Benson wire which was coiled within the renal pelvis. Under intermittent fluoroscopic guidance, the existing nephrostomy catheter was exchanged for a new 10.2 [place] all-purpose drainage catheter. Contrast injection confirmed appropriate positioning within the renal pelvis and a post exchange fluoroscopic image was obtained. The catheter was locked and secured to the skin with a suture. A dressing was placed. The patient tolerated the procedure well without immediate postprocedural complication. FINDINGS: The existing nephrostomy catheter is appropriately positioned and functioning. After successful fluoroscopic guided exchange, the new nephrostomy catheter is coiled and locked within the right renal pelvis. Right-sided antegrade nephrostogram redemonstrates occlusion of the distal aspect of the right ureter the right UVJ. IMPRESSION: Successful fluoroscopic guided exchange of right sided 10.2 French percutaneous nephrostomy catheter. Electronically Signed   By: Simonne Come M.D.   On: 02/02/2023 15:02    Assessment & Plan:   Problem List Items Addressed This Visit     Vitamin D deficiency    On Vit D      Bladder tumor - Primary    Seeing Dr Abbe Amsterdam (Oncology) w/[facility] Reviewed records Tramadol prn  Potential benefits of a long term opioids use as well as potential risks (i.e. addiction risk, apnea etc) and complications (i.e. Somnolence, constipation and others) were explained to the patient and were aknowledged.       Relevant Orders   CBC with Differential/Platelet   Comprehensive metabolic panel   Urothelial cancer (HCC)    Seeing Dr Abbe Amsterdam (Oncology) w/[facility] Reviewed records      Weight loss    Wt Readings from Last 3 Encounters:  01/12/23 226 lb 11.2 oz  (102.8 kg)  11/17/22 227 lb (103 kg)  11/09/22 224 lb 13.9 oz (102 kg)        Relevant Orders   CBC with Differential/Platelet   Comprehensive metabolic panel      Meds ordered this encounter  Medications   traMADol (ULTRAM) 50 MG tablet    Sig: Take 1 tablet (50 mg total) by mouth every 8 (eight) hours as needed for moderate pain.    Dispense:  90 tablet  Refill:  1      Follow-up: Return in about 3 months (around 05/19/2023) for a follow-up visit.  Sonda Primes, MD

## 2023-02-16 NOTE — Assessment & Plan Note (Signed)
Wt Readings from Last 3 Encounters:  01/12/23 226 lb 11.2 oz (102.8 kg)  11/17/22 227 lb (103 kg)  11/09/22 224 lb 13.9 oz (102 kg)

## 2023-02-16 NOTE — Assessment & Plan Note (Signed)
Seeing Dr Abbe Amsterdam (Oncology) w/[facility] Reviewed records

## 2023-02-16 NOTE — Assessment & Plan Note (Addendum)
Seeing Dr Abbe Amsterdam (Oncology) w/[facility] Reviewed records Tramadol prn  Potential benefits of a long term opioids use as well as potential risks (i.e. addiction risk, apnea etc) and complications (i.e. Somnolence, constipation and others) were explained to the patient and were aknowledged.

## 2023-02-16 NOTE — Assessment & Plan Note (Signed)
On Vit D 

## 2023-02-24 ENCOUNTER — Other Ambulatory Visit: Payer: 59 | Admitting: Urology

## 2023-03-02 ENCOUNTER — Telehealth: Payer: Self-pay

## 2023-03-02 NOTE — Telephone Encounter (Signed)
Patient left voicemail and call was returned with no answer. Message left to return call to office.

## 2023-03-03 NOTE — Telephone Encounter (Signed)
Patient returned call stating he was returning a missed call from our office.  I called patient back with no answer, unable to leave voicemail.  Mychart message sent requesting a call back only if patient has questions for our office.

## 2023-04-06 ENCOUNTER — Other Ambulatory Visit (HOSPITAL_COMMUNITY): Payer: Self-pay | Admitting: Interventional Radiology

## 2023-04-06 ENCOUNTER — Inpatient Hospital Stay: Payer: 59

## 2023-04-06 ENCOUNTER — Other Ambulatory Visit (HOSPITAL_COMMUNITY): Payer: 59

## 2023-04-06 ENCOUNTER — Ambulatory Visit (HOSPITAL_COMMUNITY)
Admission: RE | Admit: 2023-04-06 | Discharge: 2023-04-06 | Disposition: A | Discharge status: Home / Self Care | Payer: 59 | Source: Ambulatory Visit | Attending: Interventional Radiology | Admitting: Interventional Radiology

## 2023-04-06 ENCOUNTER — Inpatient Hospital Stay: Payer: 59 | Attending: Hematology

## 2023-04-06 DIAGNOSIS — C7989 Secondary malignant neoplasm of other specified sites: Secondary | ICD-10-CM | POA: Diagnosis not present

## 2023-04-06 DIAGNOSIS — D494 Neoplasm of unspecified behavior of bladder: Secondary | ICD-10-CM

## 2023-04-06 DIAGNOSIS — Z436 Encounter for attention to other artificial openings of urinary tract: Secondary | ICD-10-CM | POA: Insufficient documentation

## 2023-04-06 DIAGNOSIS — C689 Malignant neoplasm of urinary organ, unspecified: Secondary | ICD-10-CM

## 2023-04-06 DIAGNOSIS — C679 Malignant neoplasm of bladder, unspecified: Secondary | ICD-10-CM | POA: Diagnosis present

## 2023-04-06 HISTORY — PX: IR NEPHROSTOMY EXCHANGE RIGHT: IMG6070

## 2023-04-06 LAB — CBC WITH DIFFERENTIAL/PLATELET
Abs Immature Granulocytes: 0.05 10*3/uL (ref 0.00–0.07)
Basophils Absolute: 0.1 10*3/uL (ref 0.0–0.1)
Basophils Relative: 1 %
Eosinophils Absolute: 0.2 10*3/uL (ref 0.0–0.5)
Eosinophils Relative: 2 %
HCT: 47.5 % (ref 39.0–52.0)
Hemoglobin: 15.7 g/dL (ref 13.0–17.0)
Immature Granulocytes: 1 %
Lymphocytes Relative: 29 %
Lymphs Abs: 2.4 10*3/uL (ref 0.7–4.0)
MCH: 30.3 pg (ref 26.0–34.0)
MCHC: 33.1 g/dL (ref 30.0–36.0)
MCV: 91.5 fL (ref 80.0–100.0)
Monocytes Absolute: 0.6 10*3/uL (ref 0.1–1.0)
Monocytes Relative: 8 %
Neutro Abs: 5.1 10*3/uL (ref 1.7–7.7)
Neutrophils Relative %: 59 %
Platelets: 168 10*3/uL (ref 150–400)
RBC: 5.19 MIL/uL (ref 4.22–5.81)
RDW: 13.2 % (ref 11.5–15.5)
WBC: 8.5 10*3/uL (ref 4.0–10.5)
nRBC: 0 % (ref 0.0–0.2)

## 2023-04-06 LAB — COMPREHENSIVE METABOLIC PANEL
ALT: 16 U/L (ref 0–44)
AST: 14 U/L — ABNORMAL LOW (ref 15–41)
Albumin: 4.1 g/dL (ref 3.5–5.0)
Alkaline Phosphatase: 52 U/L (ref 38–126)
Anion gap: 6 (ref 5–15)
BUN: 17 mg/dL (ref 6–20)
CO2: 25 mmol/L (ref 22–32)
Calcium: 9.1 mg/dL (ref 8.9–10.3)
Chloride: 103 mmol/L (ref 98–111)
Creatinine, Ser: 1.1 mg/dL (ref 0.61–1.24)
GFR, Estimated: >60 mL/min (ref >60)
Glucose, Bld: 104 mg/dL — ABNORMAL HIGH (ref 70–99)
Potassium: 4.6 mmol/L (ref 3.5–5.1)
Sodium: 134 mmol/L — ABNORMAL LOW (ref 135–145)
Total Bilirubin: 0.8 mg/dL (ref 0.3–1.2)
Total Protein: 7.1 g/dL (ref 6.5–8.1)

## 2023-04-06 MED ORDER — IOHEXOL 300 MG/ML  SOLN
50.0000 mL | Freq: Once | INTRAMUSCULAR | Status: AC | PRN
  Administered 2023-04-06: 10 mL

## 2023-04-06 MED ORDER — LIDOCAINE HCL 1 % IJ SOLN
INTRAMUSCULAR | Status: AC
  Filled 2023-04-06: qty 20

## 2023-04-07 ENCOUNTER — Encounter (HOSPITAL_COMMUNITY): Payer: Self-pay

## 2023-04-07 ENCOUNTER — Ambulatory Visit (HOSPITAL_COMMUNITY): Payer: 59

## 2023-04-08 ENCOUNTER — Telehealth: Payer: Self-pay | Admitting: *Deleted

## 2023-04-08 NOTE — Telephone Encounter (Signed)
Patient will be rescheduled his visit to follow-up in 3 months with CT scan and labs. Dr. Ellin Saba will be glad to see patient next week if he is having any problems, which he denies and will call if he develops new symptoms. Since he has no issues at this time he will RTC 3 months with CT and labs as above. His insurance is declining doing scans every 3 months but they are okay doing it every 6 months. Patient made aware and verbalized understanding.

## 2023-04-12 ENCOUNTER — Telehealth: Payer: Self-pay | Admitting: Internal Medicine

## 2023-04-12 NOTE — Telephone Encounter (Signed)
Called pt he states the area where he had the procedure done it is infected. Greenish pus is coming out. MD ok to bring pt in tomorrow at 1:00 for OV.Marland KitchenRaechel Chute

## 2023-04-12 NOTE — Telephone Encounter (Signed)
Pt wants a phone call back from nurse stating he is having greenish puss from his area due to a procedure he had done stated this have happen before. I advise pt to make appt but he want a call back from nurse. Please advise.

## 2023-04-13 ENCOUNTER — Encounter: Payer: Self-pay | Admitting: Internal Medicine

## 2023-04-13 ENCOUNTER — Ambulatory Visit (INDEPENDENT_AMBULATORY_CARE_PROVIDER_SITE_OTHER): Payer: 59 | Admitting: Internal Medicine

## 2023-04-13 ENCOUNTER — Inpatient Hospital Stay: Payer: 59 | Admitting: Hematology

## 2023-04-13 VITALS — BP 120/60 | HR 76 | Temp 98.2°F | Ht 75.0 in | Wt 237.0 lb

## 2023-04-13 DIAGNOSIS — D494 Neoplasm of unspecified behavior of bladder: Secondary | ICD-10-CM

## 2023-04-13 DIAGNOSIS — T148XXA Other injury of unspecified body region, initial encounter: Secondary | ICD-10-CM | POA: Diagnosis not present

## 2023-04-13 DIAGNOSIS — F5101 Primary insomnia: Secondary | ICD-10-CM

## 2023-04-13 DIAGNOSIS — N390 Urinary tract infection, site not specified: Secondary | ICD-10-CM | POA: Diagnosis not present

## 2023-04-13 DIAGNOSIS — C672 Malignant neoplasm of lateral wall of bladder: Secondary | ICD-10-CM | POA: Diagnosis not present

## 2023-04-13 DIAGNOSIS — F419 Anxiety disorder, unspecified: Secondary | ICD-10-CM | POA: Insufficient documentation

## 2023-04-13 LAB — URINALYSIS
Bilirubin Urine: NEGATIVE
Hgb urine dipstick: NEGATIVE
Ketones, ur: NEGATIVE
Leukocytes,Ua: NEGATIVE
Nitrite: NEGATIVE
Specific Gravity, Urine: 1.01 (ref 1.000–1.030)
Total Protein, Urine: NEGATIVE
Urine Glucose: NEGATIVE
Urobilinogen, UA: 0.2 (ref 0.0–1.0)
pH: 6 (ref 5.0–8.0)

## 2023-04-13 MED ORDER — MUPIROCIN 2 % EX OINT
TOPICAL_OINTMENT | CUTANEOUS | 1 refills | Status: DC
Start: 2023-04-13 — End: 2024-05-05

## 2023-04-13 MED ORDER — CHLORHEXIDINE GLUCONATE 4 % EX SOLN: Freq: Every day | CUTANEOUS | 2 refills | Status: DC | PRN

## 2023-04-13 MED ORDER — DIAZEPAM 5 MG PO TABS: 5.0000 mg | ORAL_TABLET | Freq: Two times a day (BID) | ORAL | 1 refills | Status: DC | PRN

## 2023-04-13 MED ORDER — AMOXICILLIN-POT CLAVULANATE 875-125 MG PO TABS: 1.0000 | ORAL_TABLET | Freq: Two times a day (BID) | ORAL | 1 refills | Status: DC

## 2023-04-13 NOTE — Assessment & Plan Note (Signed)
Start Diazepam prn  Potential benefits of a long term opioids use as well as potential risks (i.e. addiction risk, apnea etc) and complications (i.e. Somnolence, constipation and others) were explained to the patient and were aknowledged.

## 2023-04-13 NOTE — Assessment & Plan Note (Signed)
Slight discharge around nephrostomy tube on the right. Continue with daily dressing changes.  Wash with hydrogen peroxide, Hibiclens scrub, or soapy water. Bactroban ointment as needed

## 2023-04-13 NOTE — Assessment & Plan Note (Signed)
Augmentin 875 mg twice daily -prescription provided Urinalysis today

## 2023-04-13 NOTE — Assessment & Plan Note (Signed)
Augmentin 875 mg twice daily as needed UTI-prescription provided Christian Kennedy is thinking about applying for disability-I agree.

## 2023-04-13 NOTE — Assessment & Plan Note (Addendum)
Zolpidem did not work.  Will try diazepam prn  Potential benefits of a short/long term benzodiazepines  use as well as potential risks  and complications were explained to the patient and were aknowledged.

## 2023-04-13 NOTE — Addendum Note (Signed)
Addended by: Tresa Garter on: 04/13/2023 05:47 PM   Modules accepted: Level of Service

## 2023-04-13 NOTE — Assessment & Plan Note (Signed)
She is thinking about applying for disability-I agree.

## 2023-04-13 NOTE — Progress Notes (Signed)
Subjective:  Patient ID: Christian Kennedy, male    DOB: September 08, 1966  Age: 57 y.o. MRN: [id number]  CC: Wound Infection (RT flank incision area where tubing is placed has possible infection with green puss drainage )   HPI Christian Kennedy presents for pus small d/c from the skin wound on R flank around the nephrostomy tube, then he developed cloudy urine and dysuria - he took Ampicillin (better now). C/o insomnia and stress Christian Kennedy is thinking about applying for disability-I agree.  Outpatient Medications Prior to Visit  Medication Sig Dispense Refill   b complex vitamins capsule Take 1 capsule by mouth daily.     BLACK CURRANT SEED OIL PO Take 1 capsule by mouth daily.     Cholecalciferol (VITAMIN D3) 50 MCG (2000 UT) CAPS Take 1 capsule (2,000 Units total) by mouth daily. 100 capsule 3   nicotine (NICODERM CQ - DOSED IN MG/24 HOURS) 14 mg/24hr patch Place 1 patch (14 mg total) onto the skin daily. 30 patch 0   traMADol (ULTRAM) 50 MG tablet Take 1 tablet (50 mg total) by mouth every 8 (eight) hours as needed for moderate pain. 90 tablet 1   sodium chloride flush (NS) 0.9 % SOLN 10 ml syr use as directed (Patient not taking: Reported on 02/16/2023) 50 Syringe 5   zolpidem (AMBIEN) 10 MG tablet Take 0.5-1 tablets (5-10 mg total) by mouth at bedtime as needed for sleep. (Patient not taking: Reported on 02/16/2023) 30 tablet 3   No facility-administered medications prior to visit.    ROS: Review of Systems  Constitutional:  Negative for appetite change, chills, fatigue and unexpected weight change.  HENT:  Negative for congestion, nosebleeds, sneezing, sore throat and trouble swallowing.   Eyes:  Negative for itching and visual disturbance.  Respiratory:  Negative for cough.   Cardiovascular:  Negative for chest pain, palpitations and leg swelling.  Gastrointestinal:  Negative for abdominal distention, blood in stool, diarrhea and nausea.  Genitourinary:  Negative for frequency and  hematuria.  Musculoskeletal:  Negative for back pain, gait problem, joint swelling and neck pain.  Skin:  Negative for rash.  Neurological:  Negative for dizziness, tremors, speech difficulty and weakness.  Psychiatric/Behavioral:  Negative for agitation, dysphoric mood and sleep disturbance. The patient is not nervous/anxious.     Objective:  BP 120/60 (BP Location: Right Arm, Patient Position: Sitting, Cuff Size: Large)   Pulse 76   Temp 98.2 F (36.8 C) (Oral)   Ht 6\' 3"  (1.905 m)   Wt 237 lb (107.5 kg)   SpO2 98%   BMI 29.62 kg/m   BP Readings from Last 3 Encounters:  04/13/23 120/60  01/12/23 121/82  11/20/22 115/68    Wt Readings from Last 3 Encounters:  04/13/23 237 lb (107.5 kg)  01/12/23 226 lb 11.2 oz (102.8 kg)  11/17/22 227 lb (103 kg)    Physical Exam Constitutional:      General: He is not in acute distress.    Appearance: Normal appearance. He is well-developed.     Comments: NAD  Eyes:     Conjunctiva/sclera: Conjunctivae normal.     Pupils: Pupils are equal, round, and reactive to light.  Neck:     Thyroid: No thyromegaly.     Vascular: No JVD.  Cardiovascular:     Rate and Rhythm: Normal rate and regular rhythm.     Heart sounds: Normal heart sounds. No murmur heard.    No friction rub. No gallop.  Pulmonary:  Effort: Pulmonary effort is normal. No respiratory distress.     Breath sounds: Normal breath sounds. No wheezing or rales.  Chest:     Chest wall: No tenderness.  Abdominal:     General: Bowel sounds are normal. There is no distension.     Palpations: Abdomen is soft. There is no mass.     Tenderness: There is no abdominal tenderness. There is no guarding or rebound.  Musculoskeletal:        General: No tenderness. Normal range of motion.     Cervical back: Normal range of motion.  Lymphadenopathy:     Cervical: No cervical adenopathy.  Skin:    General: Skin is warm and dry.     Findings: No rash.  Neurological:     Mental  Status: He is alert and oriented to person, place, and time.     Cranial Nerves: No cranial nerve deficit.     Motor: No abnormal muscle tone.     Coordination: Coordination normal.     Gait: Gait normal.     Deep Tendon Reflexes: Reflexes are normal and symmetric.  Psychiatric:        Behavior: Behavior normal.        Thought Content: Thought content normal.        Judgment: Judgment normal.   Nephrostomy - clean  Lab Results  Component Value Date   WBC 8.5 04/06/2023   HGB 15.7 04/06/2023   HCT 47.5 04/06/2023   PLT 168 04/06/2023   GLUCOSE 104 (H) 04/06/2023   CHOL 200 10/08/2021   TRIG 248.0 (H) 10/08/2021   HDL 34.60 (L) 10/08/2021   LDLDIRECT 132.0 10/08/2021   ALT 16 04/06/2023   AST 14 (L) 04/06/2023   NA 134 (L) 04/06/2023   K 4.6 04/06/2023   CL 103 04/06/2023   CREATININE 1.10 04/06/2023   BUN 17 04/06/2023   CO2 25 04/06/2023   TSH 0.70 11/17/2022   PSA 1.18 10/08/2021   INR 1.1 08/12/2022    IR NEPHROSTOMY EXCHANGE RIGHT  Result Date: 04/06/2023 INDICATION: History of bladder carcinoma with right ureteral obstruction requiring chronic nephrostomy tube drainage. Routine 8 week exchange required of the right nephrostomy tube EXAM: RIGHT PERCUTANEOUS NEPHROSTOMY TUBE EXCHANGE UNDER FLUOROSCOPY COMPARISON:  02/02/2023 MEDICATIONS: None ANESTHESIA/SEDATION: None CONTRAST:  10 mL Omnipaque 300-administered into the collecting system(s) FLUOROSCOPY: 48 seconds. 5.0 mGy. COMPLICATIONS: None immediate. PROCEDURE: Informed written consent was obtained from the patient after a thorough discussion of the procedural risks, benefits and alternatives. All questions were addressed. Maximal Sterile Barrier Technique was utilized including caps, mask, sterile gowns, sterile gloves, sterile drape, hand hygiene and skin antiseptic. A timeout was performed prior to the initiation of the procedure. A pre-existing right-sided 10 French nephrostomy tube was injected with contrast material,  cut and removed over a guidewire. A new 10 French catheter was advanced over the wire and formed at the level of the renal pelvis. Final catheter positioning was confirmed after contrast injection with a fluoroscopic image. The catheter was attached to a new gravity drainage bag. IMPRESSION: Exchange of chronic indwelling 10 French right-sided percutaneous nephrostomy tube under fluoroscopy. Electronically Signed   By: Irish Lack M.D.   On: 04/06/2023 16:05    Assessment & Plan:   Problem List Items Addressed This Visit     Bladder tumor    She is thinking about applying for disability-I agree.      Insomnia    Zolpidem did not work.  Will try  diazepam prn  Potential benefits of a short/long term benzodiazepines  use as well as potential risks  and complications were explained to the patient and were aknowledged.      UTI (urinary tract infection) - Primary    Augmentin 875 mg twice daily -prescription provided Urinalysis today      Relevant Medications   mupirocin ointment (BACTROBAN) 2 %   Other Relevant Orders   Urinalysis (Completed)   Malignant neoplasm of lateral wall of urinary bladder (HCC)    Augmentin 875 mg twice daily as needed UTI-prescription provided Tryone is thinking about applying for disability-I agree.      Relevant Medications   amoxicillin-clavulanate (AUGMENTIN) 875-125 MG tablet   diazepam (VALIUM) 5 MG tablet   Wound discharge    Slight discharge around nephrostomy tube on the right. Continue with daily dressing changes.  Wash with hydrogen peroxide, Hibiclens scrub, or soapy water. Bactroban ointment as needed      Anxiety    Start Diazepam prn  Potential benefits of a long term opioids use as well as potential risks (i.e. addiction risk, apnea etc) and complications (i.e. Somnolence, constipation and others) were explained to the patient and were aknowledged.       Relevant Medications   diazepam (VALIUM) 5 MG tablet      Meds ordered  this encounter  Medications   chlorhexidine (HIBICLENS) 4 % external liquid    Sig: Apply topically daily as needed (use for skin cleaning).    Dispense:  450 mL    Refill:  2   mupirocin ointment (BACTROBAN) 2 %    Sig: On leg wound w/dressing change qd if seeing pus, if infected    Dispense:  60 g    Refill:  1   amoxicillin-clavulanate (AUGMENTIN) 875-125 MG tablet    Sig: Take 1 tablet by mouth 2 (two) times daily.    Dispense:  20 tablet    Refill:  1   diazepam (VALIUM) 5 MG tablet    Sig: Take 1 tablet (5 mg total) by mouth 2 (two) times daily as needed (insomnia or anxiety).    Dispense:  60 tablet    Refill:  1      Follow-up: Return in about 4 months (around 08/14/2023) for a follow-up visit.  Sonda Primes, MD

## 2023-04-19 ENCOUNTER — Telehealth: Payer: Self-pay | Admitting: Student

## 2023-04-19 ENCOUNTER — Other Ambulatory Visit (HOSPITAL_COMMUNITY): Payer: Self-pay | Admitting: Student

## 2023-04-19 DIAGNOSIS — D494 Neoplasm of unspecified behavior of bladder: Secondary | ICD-10-CM

## 2023-04-19 NOTE — Telephone Encounter (Signed)
Patient contacted [facility] regarding concern for pain at his right nephrostomy site most recently exchanged 04/06/23. Patient endorses 8/10 right flank pain and is concerned about a possible infection of his nephrostomy site. He denies fever, chills, nausea, and vomiting but endorses pain, drainage of pus from nephrostomy site, and blood clots in his gravity bag. Case was discussed with Dr Loreta Ave who recommended that patient be scheduled to be seen and evaluated for possible right nephrostomy exchange on Tuesday 04/20/23. Patient scheduled to be seen at [facility] on 04/20/23. Patient advised that if he develops fever, chills, increasing pain, active bleeding from nephrostomy site, or altered mental status to present to ED for evaluation. Patient voiced his understanding. Kennieth Francois, PA-C 04/19/2023 2:32 PM

## 2023-04-20 ENCOUNTER — Other Ambulatory Visit (HOSPITAL_COMMUNITY): Payer: Self-pay | Admitting: Student

## 2023-04-20 ENCOUNTER — Ambulatory Visit (HOSPITAL_COMMUNITY)
Admission: RE | Admit: 2023-04-20 | Discharge: 2023-04-20 | Disposition: A | Discharge status: Home / Self Care | Payer: 59 | Source: Ambulatory Visit | Attending: Student | Admitting: Student

## 2023-04-20 DIAGNOSIS — D494 Neoplasm of unspecified behavior of bladder: Secondary | ICD-10-CM | POA: Diagnosis not present

## 2023-04-20 DIAGNOSIS — Z436 Encounter for attention to other artificial openings of urinary tract: Secondary | ICD-10-CM | POA: Diagnosis not present

## 2023-04-20 HISTORY — PX: IR NEPHROSTOMY EXCHANGE RIGHT: IMG6070

## 2023-04-20 HISTORY — PX: IR NEPHROSTOGRAM RIGHT THRU EXISTING ACCESS: IMG6062

## 2023-04-20 MED ORDER — IOHEXOL 300 MG/ML  SOLN
50.0000 mL | Freq: Once | INTRAMUSCULAR | Status: AC | PRN
  Administered 2023-04-20: 20 mL

## 2023-04-20 MED ORDER — LIDOCAINE HCL 1 % IJ SOLN
INTRAMUSCULAR | Status: AC
  Filled 2023-04-20: qty 20

## 2023-04-20 NOTE — Procedures (Signed)
Interventional Radiology Procedure Note  Procedure: RT PCN EXCHG  AND NEPHROSTOGRAM  Complications: None  Estimated Blood Loss:  0  Findings: FULL REPORT IN PACS    Sharen Counter, MD

## 2023-04-21 ENCOUNTER — Encounter (HOSPITAL_COMMUNITY): Payer: Self-pay | Admitting: Radiology

## 2023-04-26 ENCOUNTER — Other Ambulatory Visit: Payer: Self-pay | Admitting: Internal Medicine

## 2023-04-26 ENCOUNTER — Telehealth: Payer: Self-pay | Admitting: Internal Medicine

## 2023-04-26 ENCOUNTER — Telehealth: Payer: Self-pay | Admitting: Student

## 2023-04-26 DIAGNOSIS — R310 Gross hematuria: Secondary | ICD-10-CM

## 2023-04-26 NOTE — Telephone Encounter (Signed)
OK labs - ordered. Continues to see his urologist. Thank you

## 2023-04-26 NOTE — Telephone Encounter (Signed)
Patient called and said that his catheter is full of blood.  Radiology told him he would need blood work scheduled to check his hemoglobin.  Please advise patient.

## 2023-04-26 NOTE — Telephone Encounter (Signed)
Call received from the patient, stated that "the catheter is full of blood."  Patient is 57 yo male who is well known to IR for right PCN placement and exchanges, he has right distal UVJ obstruction due to bladder tumor.   The right PCN was exchanged on 04/20/23.  Patient states that he has been doing fine till this morning when he noticed one or two blood clots in the collecting bag. He states that he feels little bit tired but nothing too bad, denies shortness of breath, chest palpitation, or dizziness. Reports that the new tube is not causing any pain/discomfort like the previous tube.   Encouraged patient to contact his PCP or other medical provider to obtain vital signs and blood work if he started to feeling more tired, see more clots in the collecting bag, or develops shortness of breath, chest palpitation, or dizziness.  He verbalized understanding.    Lynann Bologna Ciara Kagan PA-C 04/26/2023 8:54 AM

## 2023-04-27 NOTE — Telephone Encounter (Signed)
Called pt no answer LMOM w/MD response../lmb 

## 2023-06-01 ENCOUNTER — Other Ambulatory Visit (HOSPITAL_COMMUNITY): Payer: 59

## 2023-06-15 ENCOUNTER — Ambulatory Visit (HOSPITAL_COMMUNITY)
Admission: RE | Admit: 2023-06-15 | Discharge: 2023-06-15 | Disposition: A | Discharge status: Home / Self Care | Payer: 59 | Source: Ambulatory Visit | Attending: Interventional Radiology | Admitting: Interventional Radiology

## 2023-06-15 ENCOUNTER — Other Ambulatory Visit (HOSPITAL_COMMUNITY): Payer: Self-pay | Admitting: Interventional Radiology

## 2023-06-15 DIAGNOSIS — D494 Neoplasm of unspecified behavior of bladder: Secondary | ICD-10-CM

## 2023-06-15 DIAGNOSIS — Z436 Encounter for attention to other artificial openings of urinary tract: Secondary | ICD-10-CM | POA: Insufficient documentation

## 2023-06-15 HISTORY — PX: IR NEPHROSTOMY EXCHANGE RIGHT: IMG6070

## 2023-06-15 MED ORDER — IOHEXOL 300 MG/ML  SOLN
50.0000 mL | Freq: Once | INTRAMUSCULAR | Status: AC | PRN
  Administered 2023-06-15: 10 mL

## 2023-06-15 MED ORDER — LIDOCAINE HCL 1 % IJ SOLN
INTRAMUSCULAR | Status: AC
  Filled 2023-06-15: qty 20

## 2023-07-02 ENCOUNTER — Other Ambulatory Visit: Payer: Self-pay | Admitting: Internal Medicine

## 2023-07-02 DIAGNOSIS — Z1211 Encounter for screening for malignant neoplasm of colon: Secondary | ICD-10-CM

## 2023-07-02 DIAGNOSIS — Z1212 Encounter for screening for malignant neoplasm of rectum: Secondary | ICD-10-CM

## 2023-07-08 ENCOUNTER — Other Ambulatory Visit: Payer: Self-pay

## 2023-07-08 DIAGNOSIS — C689 Malignant neoplasm of urinary organ, unspecified: Secondary | ICD-10-CM

## 2023-07-09 ENCOUNTER — Ambulatory Visit (HOSPITAL_COMMUNITY): Payer: 59

## 2023-07-09 ENCOUNTER — Inpatient Hospital Stay: Payer: 59

## 2023-07-09 ENCOUNTER — Encounter (HOSPITAL_COMMUNITY): Payer: Self-pay

## 2023-07-13 ENCOUNTER — Inpatient Hospital Stay: Payer: 59

## 2023-07-15 ENCOUNTER — Inpatient Hospital Stay: Payer: 59 | Admitting: Hematology

## 2023-08-10 ENCOUNTER — Other Ambulatory Visit (HOSPITAL_COMMUNITY): Payer: 59

## 2023-08-17 ENCOUNTER — Other Ambulatory Visit (HOSPITAL_COMMUNITY): Payer: Self-pay | Admitting: Interventional Radiology

## 2023-08-17 ENCOUNTER — Ambulatory Visit (HOSPITAL_COMMUNITY)
Admission: RE | Admit: 2023-08-17 | Discharge: 2023-08-17 | Disposition: A | Discharge status: Home / Self Care | Payer: 59 | Source: Ambulatory Visit | Attending: Interventional Radiology | Admitting: Interventional Radiology

## 2023-08-17 DIAGNOSIS — Z436 Encounter for attention to other artificial openings of urinary tract: Secondary | ICD-10-CM | POA: Insufficient documentation

## 2023-08-17 DIAGNOSIS — D494 Neoplasm of unspecified behavior of bladder: Secondary | ICD-10-CM

## 2023-08-17 HISTORY — PX: IR NEPHROSTOMY EXCHANGE RIGHT: IMG6070

## 2023-08-17 MED ORDER — LIDOCAINE HCL 1 % IJ SOLN: 20.0000 mL | Freq: Once | INTRAMUSCULAR | Status: DC

## 2023-08-17 MED ORDER — LIDOCAINE HCL 1 % IJ SOLN
INTRAMUSCULAR | Status: AC
  Filled 2023-08-17: qty 20

## 2023-08-17 MED ORDER — IOHEXOL 300 MG/ML  SOLN
50.0000 mL | Freq: Once | INTRAMUSCULAR | Status: AC | PRN
  Administered 2023-08-17: 10 mL

## 2023-08-19 ENCOUNTER — Ambulatory Visit (HOSPITAL_COMMUNITY)
Admission: RE | Admit: 2023-08-19 | Discharge: 2023-08-19 | Disposition: A | Discharge status: Home / Self Care | Payer: 59 | Source: Ambulatory Visit | Attending: Hematology | Admitting: Hematology

## 2023-08-19 ENCOUNTER — Inpatient Hospital Stay: Payer: 59 | Attending: Hematology

## 2023-08-19 DIAGNOSIS — C679 Malignant neoplasm of bladder, unspecified: Secondary | ICD-10-CM | POA: Diagnosis present

## 2023-08-19 DIAGNOSIS — C689 Malignant neoplasm of urinary organ, unspecified: Secondary | ICD-10-CM | POA: Insufficient documentation

## 2023-08-19 LAB — COMPREHENSIVE METABOLIC PANEL
ALT: 18 U/L (ref 0–44)
AST: 14 U/L — ABNORMAL LOW (ref 15–41)
Albumin: 4.4 g/dL (ref 3.5–5.0)
Alkaline Phosphatase: 57 U/L (ref 38–126)
Anion gap: 9 (ref 5–15)
BUN: 22 mg/dL — ABNORMAL HIGH (ref 6–20)
CO2: 24 mmol/L (ref 22–32)
Calcium: 9.7 mg/dL (ref 8.9–10.3)
Chloride: 102 mmol/L (ref 98–111)
Creatinine, Ser: 1.12 mg/dL (ref 0.61–1.24)
GFR, Estimated: >60 mL/min (ref >60)
Glucose, Bld: 105 mg/dL — ABNORMAL HIGH (ref 70–99)
Potassium: 5.5 mmol/L — ABNORMAL HIGH (ref 3.5–5.1)
Sodium: 135 mmol/L (ref 135–145)
Total Bilirubin: 0.9 mg/dL (ref <1.2)
Total Protein: 7.7 g/dL (ref 6.5–8.1)

## 2023-08-19 LAB — CBC WITH DIFFERENTIAL/PLATELET
Abs Immature Granulocytes: 0.03 10*3/uL (ref 0.00–0.07)
Basophils Absolute: 0 10*3/uL (ref 0.0–0.1)
Basophils Relative: 1 %
Eosinophils Absolute: 0.3 10*3/uL (ref 0.0–0.5)
Eosinophils Relative: 3 %
HCT: 49.2 % (ref 39.0–52.0)
Hemoglobin: 16.5 g/dL (ref 13.0–17.0)
Immature Granulocytes: 0 %
Lymphocytes Relative: 26 %
Lymphs Abs: 2.3 10*3/uL (ref 0.7–4.0)
MCH: 30.6 pg (ref 26.0–34.0)
MCHC: 33.5 g/dL (ref 30.0–36.0)
MCV: 91.1 fL (ref 80.0–100.0)
Monocytes Absolute: 0.7 10*3/uL (ref 0.1–1.0)
Monocytes Relative: 7 %
Neutro Abs: 5.5 10*3/uL (ref 1.7–7.7)
Neutrophils Relative %: 63 %
Platelets: 190 10*3/uL (ref 150–400)
RBC: 5.4 MIL/uL (ref 4.22–5.81)
RDW: 13.1 % (ref 11.5–15.5)
WBC: 8.8 10*3/uL (ref 4.0–10.5)
nRBC: 0 % (ref 0.0–0.2)

## 2023-08-19 MED ORDER — IOHEXOL 300 MG/ML  SOLN
100.0000 mL | Freq: Once | INTRAMUSCULAR | Status: AC | PRN
  Administered 2023-08-19: 100 mL via INTRAVENOUS

## 2023-08-30 ENCOUNTER — Inpatient Hospital Stay: Payer: 59 | Admitting: Hematology

## 2023-09-15 NOTE — Progress Notes (Incomplete)
[facility] [address] [place] [address]    Clinic Day:  09/15/2023  Referring physician: Tresa Garter, MD  Patient Care Team: Plotnikov, Georgina Quint, MD as PCP - General (Internal Medicine) Patient, No Pcp Per (General Practice) McKenzie, Mardene Celeste, MD as Consulting Physician (Urology) Jerilee Field, MD as Consulting Physician (Urology) Doreatha Massed, MD as Medical Oncologist (Medical Oncology) Therese Sarah, RN as Oncology Nurse Navigator (Medical Oncology)   ASSESSMENT & PLAN:   Assessment: 1.  Muscle invasive bladder cancer, stage III (T3N0): - TURBT of the right bladder tumor (11/19/2017): Invasive high-grade urothelial carcinoma involving muscularis propria - CTAP without contrast (04/17/2022): 4.2 cm polypoid mass in the right posterior bladder developing since prior study suspicious for recurrent bladder cancer.  Masses causing obstruction of right ureter with severe right hydronephrosis. - TURBT (05/18/2022): Infiltrating high-grade urothelial carcinoma involving muscularis propria.  LVI is identified. - 05/26/2022: Right nephrostomy tube placement - 40 pound weight loss in the last 6 months due to diet change, cutting of sugars/gluten and decreasing quantity of food  - CT CAP (06/15/2022): Right-sided bladder cancer involving distal right ureter.  Resolution of right hydronephrosis following percutaneous nephrostomy placement.  No evidence of metastatic disease in the chest, abdomen or pelvis. - Bone scan (06/16/2022): No evidence of metastatic disease. - He was evaluated by Dr. Berneice Heinrich and was recommended neoadjuvant chemotherapy followed by cystoprostatectomy/conduit diversion (not candidate for continent diversion as stage III). - We discussed the role of neoadjuvant cisplatin based combination chemotherapy.  We discussed side effects also.  Patient reports that he is been doing a lot of prayer and is not interested in taking  chemotherapy at this time.   2.  Social/family history: - He lives at home with his wife.  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  He had exposure to oil-based additives, tires seals and other chemicals.  He is a current active smoker, 1 pack/day for the last 37 years. - Mother had colon cancer.    Plan: 1.  Stage III (T3N0) high-grade urothelial carcinoma of the right bladder: - TURBT on 11/12/2022: Infiltrating high-grade papillary urothelial carcinoma invading muscularis propria. - Bone scan (12/15/2022): Tiny focal uptake in the region of left costochondral cartilage, posttraumatic.  No distant metastatic disease. - CT CAP (12/15/2022): Tiny nonspecific right middle lobe nodule versus fissural lymph node, 4 mm.  Mild right hydroureteronephrosis with right percutaneous nephrostomy tube in place.  Possible soft tissue density in the right distal ureteral which could indicate neoplasm.  Right-sided bladder wall thickening which could reflect residual neoplasm/postsurgical changes.  Prominent but not enlarged right external iliac lymph node measuring 1 x 0.7 cm. - We have again discussed neoadjuvant chemotherapy followed by cystectomy versus bladder sparing approach with combination chemoradiation therapy with TURBT.  He is not interested at this time. - He started taking ivermectin and flubendazole 2 days ago.  He plans to take it for 20 days.  He has heard about anticancer properties of the elbow drugs. - He is open to targeted treatments.  We have recommended NGS panel on the most recent TURBT sample. - I will look into clinical trial options in this setting by reaching out to my colleagues at [facility].  Otherwise he will come back in 3 months with repeat CT CAP with contrast.  No orders of the defined types were placed in this encounter.     Christian Kennedy,acting  as a scribe for Doreatha Massed, MD.,have documented all relevant  documentation on the behalf of Doreatha Massed, MD,as directed by  Doreatha Massed, MD while in the presence of Doreatha Massed, MD.  ***   Hartwick R Kennedy   12/18/202411:23 AM  CHIEF COMPLAINT:   Diagnosis: high-grade urothelial bladder cancer    Cancer Staging  Bladder tumor Staging form: Urinary Bladder, AJCC 8th Edition - Clinical stage from 06/03/2022: Stage IIIA (cT3, cN0, cM0) - Unsigned    Prior Therapy: TURBT in 2019 and 2023   Current Therapy:  surveillance   HISTORY OF PRESENT ILLNESS:   Oncology History   No history exists.     INTERVAL HISTORY:   Christian Kennedy is a 57 y.o. male presenting to clinic today for follow up of high-grade urothelial bladder cancer. He was last seen by me on 01/12/23.  Since his last visit, he underwent CT C/A/P on 08/19/23 that found: some persistent, right eccentric urinary bladder wall thickening, presumably with interval resection of a previously seen mass centered over the right ureterovesicular junction; right-sided percutaneous nephrostomy with formed pigtail in the inferior pole right renal calices; no hydronephrosis, with similar appearance of a chronically patulous right ureter; no evidence of lymphadenopathy or metastatic disease in the chest, abdomen, or pelvis; emphysema and diffuse bilateral bronchial wall thickening; and mild hepatic steatosis.  Today, he states that he is doing well overall. His appetite level is at ***%. His energy level is at ***%.  PAST MEDICAL HISTORY:   Past Medical History: Past Medical History:  Diagnosis Date   Abscess    Right buttocks   Bladder tumor    Depression    Son passed 2014   Ear infection    Eating disorder    Frequent headaches    GERD (gastroesophageal reflux disease)    Helicobacter pylori ab+ 10/27/2017   Hepatitis    childhood, jaundice   Migraines    Urinary incontinence    Varicose vein of leg    Right leg   Vitamin D deficiency     Surgical History: Past  Surgical History:  Procedure Laterality Date   CYSTOSCOPY W/ RETROGRADES Bilateral 11/12/2022   Procedure: CYSTOSCOPY WITH RETROGRADE PYELOGRAM;  Surgeon: Malen Gauze, MD;  Location: [facility];  Service: Urology;  Laterality: Bilateral;   CYSTOSCOPY W/ URETERAL STENT PLACEMENT Right 11/19/2017   Procedure: CYSTOSCOPY WITH RETROGRADE PYELOGRAM/URETERAL STENT PLACEMENT;  Surgeon: Rene Paci, MD;  [facility];  Service: Urology;  Laterality: Right;   CYSTOSCOPY WITH FULGERATION N/A 11/19/2017   Procedure: CYSTOSCOPY WITH FULGERATION AND CLOT EVACUATION;  Surgeon: Hildred Laser, MD;  Location: [facility];  Service: Urology;  Laterality: N/A;   IR NEPHROSTOMY EXCHANGE RIGHT  07/20/2022   IR NEPHROSTOMY EXCHANGE RIGHT  10/06/2022   IR NEPHROSTOMY EXCHANGE RIGHT  12/01/2022   IR NEPHROSTOMY EXCHANGE RIGHT  02/02/2023   IR NEPHROSTOMY EXCHANGE RIGHT  04/06/2023   IR NEPHROSTOMY EXCHANGE RIGHT  04/20/2023   IR NEPHROSTOMY EXCHANGE RIGHT  06/15/2023   IR NEPHROSTOMY EXCHANGE RIGHT  08/17/2023   IR NEPHROSTOMY PLACEMENT RIGHT  05/26/2022   TRANSURETHRAL RESECTION OF BLADDER TUMOR N/A 11/19/2017   Procedure: TRANSURETHRAL RESECTION OF BLADDER TUMOR (TURBT);  Surgeon: Rene Paci, MD;  Location: [facility];  Service: Urology;  Laterality: N/A;   TRANSURETHRAL RESECTION OF BLADDER TUMOR N/A 05/18/2022   Procedure: TRANSURETHRAL RESECTION OF BLADDER TUMOR (TURBT);  Surgeon: Malen Gauze, MD;  Location: [facility];  Service:  Urology;  Laterality: N/A;   TRANSURETHRAL RESECTION OF BLADDER TUMOR N/A 11/12/2022   Procedure: TRANSURETHRAL RESECTION OF BLADDER TUMOR (TURBT);  Surgeon: Malen Gauze, MD;  Location: [facility];  Service: Urology;  Laterality: N/A;    Social History: Social History   Socioeconomic History   Marital status: Married    Spouse name: Not on file   Number of children: Not on file   Years of education: Not on file    Highest education level: Some college, no degree  Occupational History   Not on file  Tobacco Use   Smoking status: Every Day    Current packs/day: 1.00    Average packs/day: 1 pack/day for 30.0 years (30.0 ttl pk-yrs)    Types: Cigarettes   Smokeless tobacco: Never  Vaping Use   Vaping status: Never Used  Substance and Sexual Activity   Alcohol use: Never   Drug use: No   Sexual activity: Not on file  Other Topics Concern   Not on file  Social History Narrative   Not on file   Social Drivers of Health   Financial Resource Strain: Medium Risk (02/14/2023)   Overall Financial Resource Strain (CARDIA)    Difficulty of Paying Living Expenses: Somewhat hard  Food Insecurity: Food Insecurity Present (02/14/2023)   Hunger Vital Sign    Worried About Running Out of Food in the Last Year: Sometimes true    Ran Out of Food in the Last Year: Sometimes true  Transportation Needs: No Transportation Needs (02/14/2023)   PRAPARE - Administrator, Civil Service (Medical): No    Lack of Transportation (Non-Medical): No  Physical Activity: Insufficiently Active (02/14/2023)   Exercise Vital Sign    Days of Exercise per Week: 3 days    Minutes of Exercise per Session: 20 min  Stress: Stress Concern Present (02/14/2023)   [employer] of Occupational Health - Occupational Stress Questionnaire    Feeling of Stress : Very much  Social Connections: Unknown (02/14/2023)   Social Connection and Isolation Panel [NHANES]    Frequency of Communication with Friends and Family: More than three times a week    Frequency of Social Gatherings with Friends and Family: Twice a week    Attends Religious Services: Patient declined    Database administrator or Organizations: No    Attends Engineer, structural: Not on file    Marital Status: Married  Intimate Partner Violence: Unknown (12/01/2022)   Received from [facility], [facility]   HITS    Physically Hurt: Not on file     Insult or Talk Down To: Not on file    Threaten Physical Harm: Not on file    Scream or Curse: Not on file    Family History: Family History  Problem Relation Age of Onset   Cancer Mother    Depression Mother    Arthritis Father    Depression Father    Hearing loss Father    Depression Daughter    Early death Son     Current Medications:  Current Outpatient Medications:    amoxicillin-clavulanate (AUGMENTIN) 875-125 MG tablet, Take 1 tablet by mouth 2 (two) times daily., Disp: 20 tablet, Rfl: 1   b complex vitamins capsule, Take 1 capsule by mouth daily., Disp: , Rfl:    BLACK CURRANT SEED OIL PO, Take 1 capsule by mouth daily., Disp: , Rfl:    chlorhexidine (HIBICLENS) 4 % external liquid, Apply topically daily as needed (use  for skin cleaning)., Disp: 450 mL, Rfl: 2   Cholecalciferol (VITAMIN D3) 50 MCG (2000 UT) CAPS, Take 1 capsule (2,000 Units total) by mouth daily., Disp: 100 capsule, Rfl: 3   diazepam (VALIUM) 5 MG tablet, Take 1 tablet (5 mg total) by mouth 2 (two) times daily as needed (insomnia or anxiety)., Disp: 60 tablet, Rfl: 1   mupirocin ointment (BACTROBAN) 2 %, On leg wound w/dressing change qd if seeing pus, if infected, Disp: 60 g, Rfl: 1   sodium chloride flush (NS) 0.9 % SOLN, 10 ml syr use as directed (Patient not taking: Reported on 02/16/2023), Disp: 50 Syringe, Rfl: 5   traMADol (ULTRAM) 50 MG tablet, Take 1 tablet (50 mg total) by mouth every 8 (eight) hours as needed for moderate pain., Disp: 90 tablet, Rfl: 1   Allergies: Allergies  Allergen Reactions   Macrobid [Nitrofurantoin]     ?epig abd pain    REVIEW OF SYSTEMS:   Review of Systems  Constitutional:  Negative for chills, fatigue and fever.  HENT:   Negative for lump/mass, mouth sores, nosebleeds, sore throat and trouble swallowing.   Eyes:  Negative for eye problems.  Respiratory:  Negative for cough and shortness of breath.   Cardiovascular:  Negative for chest pain, leg swelling and  palpitations.  Gastrointestinal:  Negative for abdominal pain, constipation, diarrhea, nausea and vomiting.  Genitourinary:  Negative for bladder incontinence, difficulty urinating, dysuria, frequency, hematuria and nocturia.   Musculoskeletal:  Negative for arthralgias, back pain, flank pain, myalgias and neck pain.  Skin:  Negative for itching and rash.  Neurological:  Negative for dizziness, headaches and numbness.  Hematological:  Does not bruise/bleed easily.  Psychiatric/Behavioral:  Negative for depression, sleep disturbance and suicidal ideas. The patient is not nervous/anxious.   All other systems reviewed and are negative.    VITALS:   There were no vitals taken for this visit.  Wt Readings from Last 3 Encounters:  04/13/23 237 lb (107.5 kg)  01/12/23 226 lb 11.2 oz (102.8 kg)  11/17/22 227 lb (103 kg)    There is no height or weight on file to calculate BMI.  Performance status (ECOG): 0 - Asymptomatic  PHYSICAL EXAM:   Physical Exam Vitals and nursing note reviewed. Exam conducted with a chaperone present.  Constitutional:      Appearance: Normal appearance.  Cardiovascular:     Rate and Rhythm: Normal rate and regular rhythm.     Pulses: Normal pulses.     Heart sounds: Normal heart sounds.  Pulmonary:     Effort: Pulmonary effort is normal.     Breath sounds: Normal breath sounds.  Abdominal:     Palpations: Abdomen is soft. There is no hepatomegaly, splenomegaly or mass.     Tenderness: There is no abdominal tenderness.  Musculoskeletal:     Right lower leg: No edema.     Left lower leg: No edema.  Lymphadenopathy:     Cervical: No cervical adenopathy.     Right cervical: No superficial, deep or posterior cervical adenopathy.    Left cervical: No superficial, deep or posterior cervical adenopathy.     Upper Body:     Right upper body: No supraclavicular or axillary adenopathy.     Left upper body: No supraclavicular or axillary adenopathy.   Neurological:     General: No focal deficit present.     Mental Status: He is alert and oriented to person, place, and time.  Psychiatric:  Mood and Affect: Mood normal.        Behavior: Behavior normal.     LABS:      Latest Ref Rng & Units 08/19/2023    7:57 AM 04/06/2023    8:28 AM 11/17/2022    3:42 PM  CBC  WBC 4.0 - 10.5 K/uL 8.8  8.5  11.5   Hemoglobin 13.0 - 17.0 g/dL 46.9  62.9  52.8   Hematocrit 39.0 - 52.0 % 49.2  47.5  46.3   Platelets 150 - 400 K/uL 190  168  220.0       Latest Ref Rng & Units 08/19/2023    7:57 AM 04/06/2023    8:28 AM 01/05/2023   12:04 PM  CMP  Glucose 70 - 99 mg/dL 413  244  87   BUN 6 - 20 mg/dL 22  17  22    Creatinine 0.61 - 1.24 mg/dL 0.10  2.72  5.36   Sodium 135 - 145 mmol/L 135  134  134   Potassium 3.5 - 5.1 mmol/L 5.5  4.6  4.1   Chloride 98 - 111 mmol/L 102  103  104   CO2 22 - 32 mmol/L 24  25  24    Calcium 8.9 - 10.3 mg/dL 9.7  9.1  8.9   Total Protein 6.5 - 8.1 g/dL 7.7  7.1  6.7   Total Bilirubin <1.2 mg/dL 0.9  0.8  0.6   Alkaline Phos 38 - 126 U/L 57  52  59   AST 15 - 41 U/L 14  14  13    ALT 0 - 44 U/L 18  16  14       No results found for: "CEA1", "CEA" / No results found for: "CEA1", "CEA" No results found for: "PSA1" No results found for: "UYQ034" No results found for: "CAN125"  No results found for: "TOTALPROTELP", "ALBUMINELP", "A1GS", "A2GS", "BETS", "BETA2SER", "GAMS", "MSPIKE", "SPEI" No results found for: "TIBC", "FERRITIN", "IRONPCTSAT" No results found for: "LDH"   Pathology 11/12/22: A. BLADDER TUMOR, TURBT:  Infiltrating high grade papillary urothelial carcinoma  The carcinoma invades muscularis propria (detrusor muscle)   STUDIES:   CT CHEST ABDOMEN PELVIS W CONTRAST Result Date: 08/27/2023 CLINICAL DATA:  Bladder cancer, assess treatment response * Tracking Code: BO * EXAM: CT CHEST, ABDOMEN, AND PELVIS WITH CONTRAST TECHNIQUE: Multidetector CT imaging of the chest, abdomen and pelvis was  performed following the standard protocol during bolus administration of intravenous contrast. RADIATION DOSE REDUCTION: This exam was performed according to the departmental dose-optimization program which includes automated exposure control, adjustment of the mA and/or kV according to patient size and/or use of iterative reconstruction technique. CONTRAST:  OMNIPAQUE IOHEXOL 300 MG/ML  SOLN COMPARISON:  CT abdomen pelvis, 09/30/2022 FINDINGS: CT CHEST FINDINGS Cardiovascular: No significant vascular findings. Normal heart size. No pericardial effusion. Mediastinum/Nodes: No enlarged mediastinal, hilar, or axillary lymph nodes. Thyroid gland, trachea, and esophagus demonstrate no significant findings. Lungs/Pleura: Mild centrilobular and paraseptal emphysema. Diffuse bilateral bronchial wall thickening. No pleural effusion or pneumothorax. Musculoskeletal: No chest wall abnormality. No acute osseous findings. CT ABDOMEN PELVIS FINDINGS Hepatobiliary: No solid liver abnormality is seen. Mild hepatic steatosis. No gallstones, gallbladder wall thickening, or biliary dilatation. Pancreas: Unremarkable. No pancreatic ductal dilatation or surrounding inflammatory changes. Spleen: Normal in size without significant abnormality. Adrenals/Urinary Tract: Adrenal glands are unremarkable. Right-sided percutaneous nephrostomy with formed pigtail in the inferior pole right renal calices. No hydronephrosis, with similar appearance of a chronically patulous right ureter. Left  kidney is normal, without renal calculi, solid lesion, or hydronephrosis. Some persistent, right eccentric urinary bladder wall thickening, presumably with interval resection of a previously seen mass centered over the right ureterovesicular junction (series 2, image 119) Stomach/Bowel: Stomach is within normal limits. Appendix appears normal. No evidence of bowel wall thickening, distention, or inflammatory changes. Vascular/Lymphatic: Aortic  atherosclerosis. No enlarged abdominal or pelvic lymph nodes. Reproductive: No mass or other abnormality. Other: No abdominal wall hernia or abnormality. No ascites. Musculoskeletal: No acute osseous findings. IMPRESSION: 1. Some persistent, right eccentric urinary bladder wall thickening, presumably with interval resection of a previously seen mass centered over the right ureterovesicular junction. 2. Right-sided percutaneous nephrostomy with formed pigtail in the inferior pole right renal calices. No hydronephrosis, with similar appearance of a chronically patulous right ureter. 3. No evidence of lymphadenopathy or metastatic disease in the chest, abdomen, or pelvis. 4. Emphysema and diffuse bilateral bronchial wall thickening. 5. Mild hepatic steatosis. Aortic Atherosclerosis (ICD10-I70.0) and Emphysema (ICD10-J43.9). Electronically Signed   By: Jearld Lesch M.D.   On: 08/27/2023 16:43   IR NEPHROSTOMY EXCHANGE RIGHT Result Date: 08/17/2023 INDICATION: 57 year old male with history of bladder carcinoma with right distal ureteral obstruction requiring chronic nephrostomy tube drainage. The patient presents for routine 8 week exchange. EXAM: FLUOROSCOPIC GUIDED RIGHT SIDED NEPHROSTOMY CATHETER EXCHANGE COMPARISON:  None Available. CONTRAST:  A total of 10 mL Isovue-300 administered was administered into the collecting system FLUOROSCOPY TIME:  Three mGy COMPLICATIONS: None immediate. TECHNIQUE: Informed written consent was obtained from the patient after a discussion of the risks, benefits and alternatives to treatment. Questions regarding the procedure were encouraged and answered. A timeout was performed prior to the initiation of the procedure. The right flank and external portions of existing nephrostomy catheter was prepped and draped in the usual sterile fashion. A sterile drape was applied covering the operative field. Maximum barrier sterile technique with sterile gowns and gloves were used for the  procedure. A timeout was performed prior to the initiation of the procedure. A pre procedural spot fluoroscopic image was obtained. A small amount of contrast was injected via the existing nephrostomy catheter demonstrating appropriate positioning within the renal pelvis. The existing nephrostomy catheter was cut and cannulated with an Amplatz wire which was coiled within the renal pelvis. Under intermittent fluoroscopic guidance, the existing nephrostomy catheter was exchanged for a new 10 [place] all-purpose drainage catheter. Limited contrast injection confirmed appropriate positioning within the renal pelvis and a post exchange fluoroscopic image was obtained. The catheter was locked, secured to the skin with an interrupted suture and reconnected to a gravity bag. A dressing was placed. The patient tolerated the procedure well without immediate postprocedural complication. FINDINGS: The existing nephrostomy catheter is appropriately positioned and functioning. After successful fluoroscopic guided exchange, a new 10French nephrostomy catheter is coiled and locked within the renal pelvis. IMPRESSION: Successful fluoroscopic guided exchange of right 10 French percutaneous nephrostomy catheter. Marliss Coots, MD [facility] Electronically Signed   By: Marliss Coots M.D.   On: 08/17/2023 16:23

## 2023-09-16 ENCOUNTER — Inpatient Hospital Stay: Payer: 59 | Admitting: Hematology

## 2023-09-17 ENCOUNTER — Ambulatory Visit (HOSPITAL_COMMUNITY)
Admission: RE | Admit: 2023-09-17 | Discharge: 2023-09-17 | Disposition: A | Discharge status: Home / Self Care | Payer: 59 | Source: Ambulatory Visit | Attending: Radiology | Admitting: Radiology

## 2023-09-17 ENCOUNTER — Other Ambulatory Visit: Payer: Self-pay | Admitting: Radiology

## 2023-09-17 DIAGNOSIS — N1330 Unspecified hydronephrosis: Secondary | ICD-10-CM | POA: Insufficient documentation

## 2023-09-17 DIAGNOSIS — C679 Malignant neoplasm of bladder, unspecified: Secondary | ICD-10-CM | POA: Insufficient documentation

## 2023-09-17 DIAGNOSIS — Z436 Encounter for attention to other artificial openings of urinary tract: Secondary | ICD-10-CM | POA: Diagnosis present

## 2023-09-17 HISTORY — PX: IR NEPHROSTOMY EXCHANGE RIGHT: IMG6070

## 2023-09-17 MED ORDER — LIDOCAINE HCL 1 % IJ SOLN
INTRAMUSCULAR | Status: AC
  Filled 2023-09-17: qty 20

## 2023-09-17 MED ORDER — IOHEXOL 300 MG/ML  SOLN
50.0000 mL | Freq: Once | INTRAMUSCULAR | Status: AC | PRN
  Administered 2023-09-17: 15 mL

## 2023-09-17 NOTE — Procedures (Signed)
Pre Procedure Dx: Hydronephrosis Post Procedure Dx: Same  Successful left PCN exchange.    EBL: None   No immediate complications.   Ronny Bacon, MD Pager #: [phone]

## 2023-09-28 ENCOUNTER — Inpatient Hospital Stay: Payer: 59 | Attending: Hematology | Admitting: Hematology

## 2023-09-28 VITALS — BP 132/83 | HR 81 | Temp 97.8°F | Resp 16 | Wt 245.4 lb

## 2023-09-28 DIAGNOSIS — F17210 Nicotine dependence, cigarettes, uncomplicated: Secondary | ICD-10-CM | POA: Insufficient documentation

## 2023-09-28 DIAGNOSIS — C679 Malignant neoplasm of bladder, unspecified: Secondary | ICD-10-CM | POA: Insufficient documentation

## 2023-09-28 DIAGNOSIS — C689 Malignant neoplasm of urinary organ, unspecified: Secondary | ICD-10-CM | POA: Diagnosis not present

## 2023-09-28 NOTE — Progress Notes (Signed)
 [facility] [address] [place] [address]    Clinic Day:  09/30/2023  Referring physician: Garald Christian GAILS, MD  Patient Care Team: Christian Kennedy, Christian GAILS, MD as PCP - General (Internal Medicine) Patient, Christian Kennedy (General Practice) Kennedy, Christian CROME, MD as Consulting Physician (Urology) Christian Cough, MD as Consulting Physician (Urology) Christian Hai, MD as Medical Oncologist (Medical Oncology) Christian Joesph SQUIBB, RN as Oncology Nurse Navigator (Medical Oncology)   ASSESSMENT & PLAN:   Assessment: 1.  Muscle invasive bladder cancer, stage III (T3N0): - TURBT of the right bladder tumor (11/19/2017): Invasive high-grade urothelial carcinoma involving muscularis propria - CTAP without contrast (04/17/2022): 4.2 cm polypoid mass in the right posterior bladder developing since prior study suspicious for recurrent bladder cancer.  Masses causing obstruction of right ureter with severe right hydronephrosis. - TURBT (05/18/2022): Infiltrating high-grade urothelial carcinoma involving muscularis propria.  LVI is identified. - 05/26/2022: Right nephrostomy tube placement - 40 pound weight loss in the last 6 months due to diet change, cutting of sugars/gluten and decreasing quantity of food  - CT CAP (06/15/2022): Right-sided bladder cancer involving distal right ureter.  Resolution of right hydronephrosis following percutaneous nephrostomy placement.  Christian evidence of metastatic disease in the chest, abdomen or pelvis. - Bone scan (06/16/2022): Christian evidence of metastatic disease. - He was evaluated by Dr. Alvaro and was recommended neoadjuvant chemotherapy followed by cystoprostatectomy/conduit diversion (not candidate for continent diversion as stage III). - We discussed the role of neoadjuvant cisplatin based combination chemotherapy.  We discussed side effects also.  Patient reports that he is been doing a lot of prayer and is not interested in taking  chemotherapy at this time. - TURBT on 11/12/2022: Infiltrating high-grade papillary urothelial carcinoma invading muscularis propria. - Bone scan (12/15/2022): Tiny focal uptake in the region of left costochondral cartilage, posttraumatic.  Christian distant metastatic disease. - CT CAP (12/15/2022): Tiny nonspecific right middle lobe nodule versus fissural lymph node, 4 mm.  Mild right hydroureteronephrosis with right percutaneous nephrostomy tube in place.  Possible soft tissue density in the right distal ureteral which could indicate neoplasm.  Right-sided bladder wall thickening which could reflect residual neoplasm/postsurgical changes.  Prominent but not enlarged right external iliac lymph node measuring 1 x 0.7 cm.   2.  Social/family history: - He lives at home with his wife.  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  He had exposure to oil-based additives, tires seals and other chemicals.  He is a current active smoker, 1 pack/day for the last 37 years. - Mother had colon cancer.    Plan: 1.  Stage III (T3N0) high-grade urothelial carcinoma of the right bladder: - He does not report any new onset pains.  Christian hematuria reported. - Labs from 08/19/2023: Normal LFTs and creatinine.  CBC was normal. - He had nephrostomy tube changed on 09/17/2023. - We reviewed CT CAP from 08/19/2023: Persistent right eccentric urinary bladder wall thickening.  Right-sided percutaneous nephrostomy.  Christian evidence of lymphadenopathy or metastatic disease in the chest, abdomen or pelvis. - He is taking soursop, moringa and black seed berries supplements. - RTC 6 months for follow-up with repeat CT CAP with contrast and labs.  Orders Placed This Encounter  Procedures   CT CHEST ABDOMEN PELVIS W CONTRAST    Standing Status:   Future    Expected Date:   03/27/2024    Expiration Date:   09/27/2024  If indicated for the ordered procedure, I authorize the administration of contrast media  Kennedy Radiology protocol:   Yes    Does the patient have a contrast media/X-ray dye allergy?:   Christian    Preferred imaging location?:   [facility]    Release to patient:   Immediate    If indicated for the ordered procedure, I authorize the administration of oral contrast media Kennedy Radiology protocol:   Yes   CBC with Differential    Standing Status:   Future    Expected Date:   03/20/2024    Expiration Date:   09/27/2024   Comprehensive metabolic panel    Standing Status:   Future    Expected Date:   03/20/2024    Expiration Date:   09/27/2024      Christian Kennedy,acting as a scribe for Christian Stands, MD.,have documented all relevant documentation on the behalf of Christian Stands, MD,as directed by  Christian Stands, MD while in the presence of Christian Stands, MD.  I, Christian Stands MD, have reviewed the above documentation for accuracy and completeness, and I agree with the above.    Christian Stands, MD   1/2/20259:04 AM  CHIEF COMPLAINT:   Diagnosis: high-grade urothelial bladder cancer    Cancer Staging  Bladder tumor Staging form: Urinary Bladder, AJCC 8th Edition - Clinical stage from 06/03/2022: Stage IIIA (cT3, cN0, cM0) - Unsigned    Prior Therapy: TURBT in 2019 and 2023   Current Therapy:  surveillance   HISTORY OF PRESENT ILLNESS:   Oncology History   Christian history exists.     INTERVAL HISTORY:   Christian Kennedy is a 57 y.o. male presenting to clinic today for follow up of high-grade urothelial bladder cancer. He was last seen by me on 01/12/23.  Since his last visit, he underwent CT C/A/P on 08/19/23 that found: some persistent, right eccentric urinary bladder wall thickening, presumably with interval resection of a previously seen mass centered over the right ureterovesicular junction; right-sided percutaneous nephrostomy with formed pigtail in the inferior pole right renal calices; Christian hydronephrosis, with similar appearance of a  chronically patulous right ureter; Christian evidence of lymphadenopathy or metastatic disease in the chest, abdomen, or pelvis; emphysema and diffuse bilateral bronchial wall thickening; and mild hepatic steatosis.  Today, he states that he is doing well overall. His appetite level is at 100%. His energy level is at 100%. He has not done any TURBT since January 2023. He is taking Moringa, black seed berries, and Soursop gummies.   PAST MEDICAL HISTORY:   Past Medical History: Past Medical History:  Diagnosis Date   Abscess    Right buttocks   Bladder tumor    Depression    Son passed 2014   Ear infection    Eating disorder    Frequent headaches    GERD (gastroesophageal reflux disease)    Helicobacter pylori ab+ 10/27/2017   Hepatitis    childhood, jaundice   Migraines    Urinary incontinence    Varicose vein of leg    Right leg   Vitamin D  deficiency     Surgical History: Past Surgical History:  Procedure Laterality Date   CYSTOSCOPY W/ RETROGRADES Bilateral 11/12/2022   Procedure: CYSTOSCOPY WITH RETROGRADE PYELOGRAM;  Surgeon: Sherrilee Christian CROME, MD;  Location: [facility];  Service: Urology;  Laterality: Bilateral;   CYSTOSCOPY W/ URETERAL STENT PLACEMENT Right 11/19/2017   Procedure: CYSTOSCOPY WITH RETROGRADE PYELOGRAM/URETERAL STENT PLACEMENT;  Surgeon: Devere Lonni Righter, MD;  Location: [facility];  Service: Urology;  Laterality: Right;   CYSTOSCOPY WITH FULGERATION N/A 11/19/2017   Procedure: CYSTOSCOPY WITH FULGERATION AND CLOT EVACUATION;  Surgeon: Chauncey Redell Agent, MD;  Location: [facility];  Service: Urology;  Laterality: N/A;   IR NEPHROSTOMY EXCHANGE RIGHT  07/20/2022   IR NEPHROSTOMY EXCHANGE RIGHT  10/06/2022   IR NEPHROSTOMY EXCHANGE RIGHT  12/01/2022   IR NEPHROSTOMY EXCHANGE RIGHT  02/02/2023   IR NEPHROSTOMY EXCHANGE RIGHT  04/06/2023   IR NEPHROSTOMY EXCHANGE RIGHT  04/20/2023   IR NEPHROSTOMY EXCHANGE RIGHT  06/15/2023   IR NEPHROSTOMY EXCHANGE RIGHT   08/17/2023   IR NEPHROSTOMY EXCHANGE RIGHT  09/17/2023   IR NEPHROSTOMY PLACEMENT RIGHT  05/26/2022   TRANSURETHRAL RESECTION OF BLADDER TUMOR N/A 11/19/2017   Procedure: TRANSURETHRAL RESECTION OF BLADDER TUMOR (TURBT);  Surgeon: Devere Lonni Righter, MD;  Location: [facility];  Service: Urology;  Laterality: N/A;   TRANSURETHRAL RESECTION OF BLADDER TUMOR N/A 05/18/2022   Procedure: TRANSURETHRAL RESECTION OF BLADDER TUMOR (TURBT);  Surgeon: Sherrilee Christian CROME, MD;  Location: [facility];  Service: Urology;  Laterality: N/A;   TRANSURETHRAL RESECTION OF BLADDER TUMOR N/A 11/12/2022   Procedure: TRANSURETHRAL RESECTION OF BLADDER TUMOR (TURBT);  Surgeon: Sherrilee Christian CROME, MD;  Location: [facility];  Service: Urology;  Laterality: N/A;    Social History: Social History   Socioeconomic History   Marital status: Married    Spouse name: Not on file   Number of children: Not on file   Years of education: Not on file   Highest education level: Some college, Christian degree  Occupational History   Not on file  Tobacco Use   Smoking status: Every Day    Current packs/day: 1.00    Average packs/day: 1 pack/day for 30.0 years (30.0 ttl pk-yrs)    Types: Cigarettes   Smokeless tobacco: Never  Vaping Use   Vaping status: Never Used  Substance and Sexual Activity   Alcohol use: Never   Drug use: Christian   Sexual activity: Not on file  Other Topics Concern   Not on file  Social History Narrative   Not on file   Social Drivers of Health   Financial Resource Strain: Medium Risk (02/14/2023)   Overall Financial Resource Strain (CARDIA)    Difficulty of Paying Living Expenses: Somewhat hard  Food Insecurity: Food Insecurity Present (02/14/2023)   Hunger Vital Sign    Worried About Running Out of Food in the Last Year: Sometimes true    Ran Out of Food in the Last Year: Sometimes true  Transportation Needs: Christian Transportation Needs (02/14/2023)   PRAPARE - Scientist, Research (physical Sciences) (Medical): Christian    Lack of Transportation (Non-Medical): Christian  Physical Activity: Insufficiently Active (02/14/2023)   Exercise Vital Sign    Days of Exercise Kennedy Week: 3 days    Minutes of Exercise Kennedy Session: 20 min  Stress: Stress Concern Present (02/14/2023)   [employer] of Occupational Health - Occupational Stress Questionnaire    Feeling of Stress : Very much  Social Connections: Unknown (02/14/2023)   Social Connection and Isolation Panel [NHANES]    Frequency of Communication with Friends and Family: More than three times a week    Frequency of Social Gatherings with Friends and Family: Twice a week    Attends Religious Services: Patient declined    Database Administrator or Organizations: Christian    Attends Banker Meetings: Not on  file    Marital Status: Married  Catering Manager Violence: Unknown (12/01/2022)   Received from [facility]   HITS    Physically Hurt: Not on file    Insult or Talk Down To: Not on file    Threaten Physical Harm: Not on file    Scream or Curse: Not on file    Family History: Family History  Problem Relation Age of Onset   Cancer Mother    Depression Mother    Arthritis Father    Depression Father    Hearing loss Father    Depression Daughter    Early death Son     Current Medications:  Current Outpatient Medications:    amoxicillin -clavulanate (AUGMENTIN ) 875-125 MG tablet, Take 1 tablet by mouth 2 (two) times daily., Disp: 20 tablet, Rfl: 1   b complex vitamins capsule, Take 1 capsule by mouth daily., Disp: , Rfl:    BLACK CURRANT SEED OIL PO, Take 1 capsule by mouth daily., Disp: , Rfl:    chlorhexidine  (HIBICLENS ) 4 % external liquid, Apply topically daily as needed (use for skin cleaning)., Disp: 450 mL, Rfl: 2   Cholecalciferol (VITAMIN D3) 50 MCG (2000 UT) CAPS, Take 1 capsule (2,000 Units total) by mouth daily., Disp: 100 capsule, Rfl: 3   diazepam  (VALIUM ) 5 MG tablet, Take 1 tablet (5  mg total) by mouth 2 (two) times daily as needed (insomnia or anxiety)., Disp: 60 tablet, Rfl: 1   mupirocin  ointment (BACTROBAN ) 2 %, On leg wound w/dressing change qd if seeing pus, if infected, Disp: 60 g, Rfl: 1   sodium chloride  flush (NS) 0.9 % SOLN, 10 ml syr use as directed, Disp: 50 Syringe, Rfl: 5   traMADol  (ULTRAM ) 50 MG tablet, Take 1 tablet (50 mg total) by mouth every 8 (eight) hours as needed for moderate pain., Disp: 90 tablet, Rfl: 1   Allergies: Allergies  Allergen Reactions   Macrobid  [Nitrofurantoin ]     ?epig abd pain    REVIEW OF SYSTEMS:   Review of Systems  Constitutional:  Negative for chills, fatigue and fever.  HENT:   Negative for lump/mass, mouth sores, nosebleeds, sore throat and trouble swallowing.   Eyes:  Negative for eye problems.  Respiratory:  Positive for Kennedy. Negative for shortness of breath.   Cardiovascular:  Negative for chest pain, leg swelling and palpitations.  Gastrointestinal:  Negative for abdominal pain, constipation, diarrhea, nausea and vomiting.  Genitourinary:  Negative for bladder incontinence, difficulty urinating, dysuria, frequency, hematuria and nocturia.   Musculoskeletal:  Negative for arthralgias, back pain, flank pain, myalgias and neck pain.  Skin:  Negative for itching and rash.  Neurological:  Negative for dizziness, headaches and numbness.  Hematological:  Does not bruise/bleed easily.  Psychiatric/Behavioral:  Positive for sleep disturbance. Negative for depression and suicidal ideas. The patient is not nervous/anxious.   All other systems reviewed and are negative.    VITALS:   Blood pressure 132/83, pulse 81, temperature 97.8 F (36.6 C), temperature source Oral, resp. rate 16, weight 245 lb 6.4 oz (111.3 kg), SpO2 100%.  Wt Readings from Last 3 Encounters:  09/28/23 245 lb 6.4 oz (111.3 kg)  04/13/23 237 lb (107.5 kg)  01/12/23 226 lb 11.2 oz (102.8 kg)    Body mass index is 30.67 kg/m.  Performance  status (ECOG): 0 - Asymptomatic  PHYSICAL EXAM:   Physical Exam Vitals and nursing note reviewed. Exam conducted with a chaperone present.  Constitutional:  Appearance: Normal appearance.  Cardiovascular:     Rate and Rhythm: Normal rate and regular rhythm.     Pulses: Normal pulses.     Heart sounds: Normal heart sounds.  Pulmonary:     Effort: Pulmonary effort is normal.     Breath sounds: Normal breath sounds.  Abdominal:     Palpations: Abdomen is soft. There is Christian hepatomegaly, splenomegaly or mass.     Tenderness: There is Christian abdominal tenderness.  Musculoskeletal:     Right lower leg: Christian edema.     Left lower leg: Christian edema.  Lymphadenopathy:     Cervical: Christian cervical adenopathy.     Right cervical: Christian superficial, deep or posterior cervical adenopathy.    Left cervical: Christian superficial, deep or posterior cervical adenopathy.     Upper Body:     Right upper body: Christian supraclavicular or axillary adenopathy.     Left upper body: Christian supraclavicular or axillary adenopathy.  Neurological:     General: Christian focal deficit present.     Mental Status: He is alert and oriented to person, place, and time.  Psychiatric:        Mood and Affect: Mood normal.        Behavior: Behavior normal.     LABS:      Latest Ref Rng & Units 08/19/2023    7:57 AM 04/06/2023    8:28 AM 11/17/2022    3:42 PM  CBC  WBC 4.0 - 10.5 K/uL 8.8  8.5  11.5   Hemoglobin 13.0 - 17.0 g/dL 83.4  84.2  84.4   Hematocrit 39.0 - 52.0 % 49.2  47.5  46.3   Platelets 150 - 400 K/uL 190  168  220.0       Latest Ref Rng & Units 08/19/2023    7:57 AM 04/06/2023    8:28 AM 01/05/2023   12:04 PM  CMP  Glucose 70 - 99 mg/dL 894  895  87   BUN 6 - 20 mg/dL 22  17  22    Creatinine 0.61 - 1.24 mg/dL 8.87  8.89  9.02   Sodium 135 - 145 mmol/L 135  134  134   Potassium 3.5 - 5.1 mmol/L 5.5  4.6  4.1   Chloride 98 - 111 mmol/L 102  103  104   CO2 22 - 32 mmol/L 24  25  24    Calcium 8.9 - 10.3 mg/dL 9.7  9.1  8.9    Total Protein 6.5 - 8.1 g/dL 7.7  7.1  6.7   Total Bilirubin <1.2 mg/dL 0.9  0.8  0.6   Alkaline Phos 38 - 126 U/L 57  52  59   AST 15 - 41 U/L 14  14  13    ALT 0 - 44 U/L 18  16  14       Christian results found for: CEA1, CEA / Christian results found for: CEA1, CEA Christian results found for: PSA1 Christian results found for: CAN199 Christian results found for: CAN125  Christian results found for: TOTALPROTELP, ALBUMINELP, A1GS, A2GS, BETS, BETA2SER, GAMS, MSPIKE, SPEI Christian results found for: TIBC, FERRITIN, IRONPCTSAT Christian results found for: LDH   Pathology 11/12/22: A. BLADDER TUMOR, TURBT:  Infiltrating high grade papillary urothelial carcinoma  The carcinoma invades muscularis propria (detrusor muscle)   STUDIES:   IR NEPHROSTOMY EXCHANGE RIGHT Result Date: 09/17/2023 INDICATION: Partial retraction of chronic left-sided nephrostomy catheter. Patient presents today for fluoroscopic guided exchange and repositioning. EXAM: FLUOROSCOPIC GUIDED LEFT  SIDED NEPHROSTOMY CATHETER EXCHANGE COMPARISON:  None Available. CONTRAST:  15 cc Omnipaque  300 administered into the collecting system FLUOROSCOPY TIME:  1 minute, 6 seconds COMPLICATIONS: None immediate. TECHNIQUE: Informed written consent was obtained from the patient after a discussion of the risks, benefits and alternatives to treatment. Questions regarding the procedure were encouraged and answered. A timeout was performed prior to the initiation of the procedure. The left flank and external portion of existing nephrostomy catheter were prepped and draped in the usual sterile fashion. A sterile drape was applied covering the operative field. Maximum barrier sterile technique with sterile gowns and gloves were used for the procedure. A timeout was performed prior to the initiation of the procedure. A pre procedural spot fluoroscopic image was obtained after contrast was injected via the existing nephrostomy catheter. The external portion of  the nephrostomy catheter was cut and cannulated with a stiff Glidewire which was advanced to the level of the left renal pelvis. Next, under intermittent fluoroscopic guidance, the existing 10 French nephrostomy catheter was exchanged for a new 10 French nephrostomy catheter with end coiled and locked within the left renal pelvis. A dressing was applied. The patient tolerated the procedure well without immediate postprocedural complication. FINDINGS: The existing nephrostomy catheter is partially withdrawn with end coiled and locked within the accessed left renal calyx. After successful fluoroscopic guided exchange, and repositioning the new nephrostomy catheter is coiled and locked within the left renal pelvis. IMPRESSION: Successful fluoroscopic guided exchange of left sided 10.2 French percutaneous nephrostomy catheter. Electronically Signed   By: Norleen Roulette M.D.   On: 09/17/2023 17:12

## 2023-09-28 NOTE — Patient Instructions (Addendum)
 [facility] at [facility] Discharge Instructions   You were seen and examined today by Dr. Rogers.  He reviewed the results of your CT scan that was good.   He reviewed the results of lab work from November. Your potassium was a little high. You said you have cut back on your intake of bananas, which will help bring this down.   We will see you back in 6 months. We will repeat a CT scan and lab work prior to this visit.   Return as scheduled.    Thank you for choosing [facility] at [facility] to provide your oncology and hematology care.  To afford each patient quality time with our provider, please arrive at least 15 minutes before your scheduled appointment time.   If you have a lab appointment with the Cancer Center please come in thru the Main Entrance and check in at the main information desk.  You need to re-schedule your appointment should you arrive 10 or more minutes late.  We strive to give you quality time with our providers, and arriving late affects you and other patients whose appointments are after yours.  Also, if you no show three or more times for appointments you may be dismissed from the clinic at the providers discretion.     Again, thank you for choosing [facility].  Our hope is that these requests will decrease the amount of time that you wait before being seen by our physicians.       _____________________________________________________________  Should you have questions after your visit to [facility], please contact our office at [phone] and follow the prompts.  Our office hours are 8:00 a.m. and 4:30 p.m. Monday - Friday.  Please note that voicemails left after 4:00 p.m. may not be returned until the following business day.  We are closed weekends and major holidays.  You do have access to a nurse 24-7, just call the main number to the clinic [phone] and do not press any  options, hold on the line and a nurse will answer the phone.    For prescription refill requests, have your pharmacy contact our office and allow 72 hours.    Due to Covid, you will need to wear a mask upon entering the hospital. If you do not have a mask, a mask will be given to you at the Main Entrance upon arrival. For doctor visits, patients may have 1 support person age 82 or older with them. For treatment visits, patients can not have anyone with them due to social distancing guidelines and our immunocompromised population.

## 2023-10-12 ENCOUNTER — Other Ambulatory Visit (HOSPITAL_COMMUNITY): Payer: 59

## 2023-11-08 ENCOUNTER — Other Ambulatory Visit: Payer: Self-pay | Admitting: Radiology

## 2023-11-08 ENCOUNTER — Ambulatory Visit (HOSPITAL_COMMUNITY)
Admission: RE | Admit: 2023-11-08 | Discharge: 2023-11-08 | Disposition: A | Discharge status: Home / Self Care | Payer: 59 | Source: Ambulatory Visit | Attending: Radiology | Admitting: Radiology

## 2023-11-08 DIAGNOSIS — C679 Malignant neoplasm of bladder, unspecified: Secondary | ICD-10-CM

## 2023-11-08 DIAGNOSIS — Z436 Encounter for attention to other artificial openings of urinary tract: Secondary | ICD-10-CM | POA: Diagnosis present

## 2023-11-08 HISTORY — PX: IR NEPHROSTOMY EXCHANGE RIGHT: IMG6070

## 2023-11-08 MED ORDER — IOHEXOL 300 MG/ML  SOLN
50.0000 mL | Freq: Once | INTRAMUSCULAR | Status: AC | PRN
  Administered 2023-11-08: 15 mL

## 2023-11-08 MED ORDER — LIDOCAINE HCL 1 % IJ SOLN
INTRAMUSCULAR | Status: AC
  Filled 2023-11-08: qty 20

## 2023-11-08 NOTE — Procedures (Signed)
  Procedure:  Exchange R PCN under fluoro 54f Preprocedure diagnosis: The encounter diagnosis was Malignant neoplasm of urinary bladder, unspecified site (HCC). Postprocedure diagnosis: same EBL:    minimal Complications:   none immediate  See full dictation in [employer].  Nicky Barrack MD Main # [phone] Pager  [phone] Mobile [phone]

## 2023-11-11 ENCOUNTER — Other Ambulatory Visit (HOSPITAL_COMMUNITY): Payer: 59

## 2023-12-21 ENCOUNTER — Other Ambulatory Visit: Payer: Self-pay | Admitting: Radiology

## 2023-12-21 ENCOUNTER — Ambulatory Visit (HOSPITAL_COMMUNITY)
Admission: RE | Admit: 2023-12-21 | Discharge: 2023-12-21 | Disposition: A | Discharge status: Home / Self Care | Payer: Self-pay | Source: Ambulatory Visit | Attending: Interventional Radiology | Admitting: Interventional Radiology

## 2023-12-21 DIAGNOSIS — C679 Malignant neoplasm of bladder, unspecified: Secondary | ICD-10-CM

## 2023-12-21 HISTORY — PX: IR NEPHROSTOGRAM RIGHT THRU EXISTING ACCESS: IMG6062

## 2023-12-21 HISTORY — PX: IR NEPHROSTOMY EXCHANGE RIGHT: IMG6070

## 2023-12-21 MED ORDER — CEFAZOLIN SODIUM-DEXTROSE 2-4 GM/100ML-% IV SOLN
2.0000 g | Freq: Once | INTRAVENOUS | Status: AC
  Administered 2023-12-21: 2 g via INTRAVENOUS

## 2023-12-21 MED ORDER — IOHEXOL 300 MG/ML  SOLN
50.0000 mL | Freq: Once | INTRAMUSCULAR | Status: AC | PRN
  Administered 2023-12-21: 20 mL

## 2023-12-21 MED ORDER — CEFAZOLIN SODIUM-DEXTROSE 2-4 GM/100ML-% IV SOLN
INTRAVENOUS | Status: AC
  Filled 2023-12-21: qty 100

## 2023-12-27 ENCOUNTER — Ambulatory Visit (INDEPENDENT_AMBULATORY_CARE_PROVIDER_SITE_OTHER): Payer: Self-pay | Admitting: Internal Medicine

## 2023-12-27 ENCOUNTER — Encounter: Payer: Self-pay | Admitting: Internal Medicine

## 2023-12-27 VITALS — BP 110/64 | HR 86 | Temp 97.7°F | Ht 75.0 in | Wt 250.2 lb

## 2023-12-27 DIAGNOSIS — R634 Abnormal weight loss: Secondary | ICD-10-CM

## 2023-12-27 DIAGNOSIS — D494 Neoplasm of unspecified behavior of bladder: Secondary | ICD-10-CM

## 2023-12-27 DIAGNOSIS — Z0000 Encounter for general adult medical examination without abnormal findings: Secondary | ICD-10-CM

## 2023-12-27 DIAGNOSIS — R5382 Chronic fatigue, unspecified: Secondary | ICD-10-CM

## 2023-12-27 DIAGNOSIS — E559 Vitamin D deficiency, unspecified: Secondary | ICD-10-CM

## 2023-12-27 DIAGNOSIS — N390 Urinary tract infection, site not specified: Secondary | ICD-10-CM

## 2023-12-27 MED ORDER — RABEPRAZOLE SODIUM 20 MG PO TBEC: 20.0000 mg | DELAYED_RELEASE_TABLET | Freq: Every day | ORAL | 3 refills | Status: DC

## 2023-12-27 MED ORDER — TRAMADOL HCL 50 MG PO TABS: 50.0000 mg | ORAL_TABLET | Freq: Three times a day (TID) | ORAL | 1 refills | Status: DC | PRN

## 2023-12-27 MED ORDER — AMOXICILLIN-POT CLAVULANATE 875-125 MG PO TABS: 1.0000 | ORAL_TABLET | Freq: Two times a day (BID) | ORAL | 1 refills | Status: DC

## 2023-12-27 NOTE — Progress Notes (Signed)
 Subjective:  Patient ID: Christian Kennedy, male    DOB: [birth year]  Age: 58 y.o. MRN: [id number]  CC: Medical Management of Chronic Issues (Requesting labs. Form for CDL filled out. UTI like symptoms for the past 3-4 days. Patient attempted to treat with old antibiotic script but has since ran out. Would like advise on medication for GERD (had taken Rabeprazole in the past))   HPI Christian Kennedy presents for GERD, bladder cancer, UTI CDL exam He is here w/his wife   Outpatient Medications Prior to Visit  Medication Sig Dispense Refill   b complex vitamins capsule Take 1 capsule by mouth daily.     BLACK CURRANT SEED OIL PO Take 1 capsule by mouth daily.     chlorhexidine (HIBICLENS) 4 % external liquid Apply topically daily as needed (use for skin cleaning). 450 mL 2   Cholecalciferol (VITAMIN D3) 50 MCG (2000 UT) CAPS Take 1 capsule (2,000 Units total) by mouth daily. 100 capsule 3   diazepam (VALIUM) 5 MG tablet Take 1 tablet (5 mg total) by mouth 2 (two) times daily as needed (insomnia or anxiety). 60 tablet 1   mupirocin ointment (BACTROBAN) 2 % On leg wound w/dressing change qd if seeing pus, if infected 60 g 1   sodium chloride flush (NS) 0.9 % SOLN 10 ml syr use as directed 50 Syringe 5   amoxicillin-clavulanate (AUGMENTIN) 875-125 MG tablet Take 1 tablet by mouth 2 (two) times daily. 20 tablet 1   traMADol (ULTRAM) 50 MG tablet Take 1 tablet (50 mg total) by mouth every 8 (eight) hours as needed for moderate pain. 90 tablet 1   No facility-administered medications prior to visit.    ROS: Review of Systems  Constitutional:  Positive for fever. Negative for appetite change, fatigue and unexpected weight change.  HENT:  Negative for congestion, nosebleeds, sneezing, sore throat and trouble swallowing.   Eyes:  Negative for itching and visual disturbance.  Respiratory:  Negative for cough.   Cardiovascular:  Negative for chest pain, palpitations and leg swelling.   Gastrointestinal:  Negative for abdominal distention, blood in stool, diarrhea and nausea.  Genitourinary:  Positive for decreased urine volume and flank pain. Negative for frequency and hematuria.  Musculoskeletal:  Negative for back pain, gait problem, joint swelling and neck pain.  Skin:  Negative for rash.  Neurological:  Negative for dizziness, tremors, speech difficulty and weakness.  Psychiatric/Behavioral:  Negative for agitation, dysphoric mood and sleep disturbance. The patient is not nervous/anxious.     Objective:  BP 110/64   Pulse 86   Temp 97.7 F (36.5 C)   Ht 6\' 3"  (1.905 m)   Wt 250 lb 3.2 oz (113.5 kg)   SpO2 98%   BMI 31.27 kg/m   BP Readings from Last 3 Encounters:  12/27/23 110/64  09/28/23 132/83  04/13/23 120/60    Wt Readings from Last 3 Encounters:  12/27/23 250 lb 3.2 oz (113.5 kg)  09/28/23 245 lb 6.4 oz (111.3 kg)  04/13/23 237 lb (107.5 kg)    Physical Exam Constitutional:      General: He is not in acute distress.    Appearance: Normal appearance. He is well-developed.     Comments: NAD  Eyes:     Conjunctiva/sclera: Conjunctivae normal.     Pupils: Pupils are equal, round, and reactive to light.  Neck:     Thyroid: No thyromegaly.     Vascular: No JVD.  Cardiovascular:     Rate  and Rhythm: Normal rate and regular rhythm.     Heart sounds: Normal heart sounds. No murmur heard.    No friction rub. No gallop.  Pulmonary:     Effort: Pulmonary effort is normal. No respiratory distress.     Breath sounds: Normal breath sounds. No wheezing or rales.  Chest:     Chest wall: No tenderness.  Abdominal:     General: Bowel sounds are normal. There is no distension.     Palpations: Abdomen is soft. There is no mass.     Tenderness: There is no abdominal tenderness. There is no guarding or rebound.  Musculoskeletal:        General: No tenderness. Normal range of motion.     Cervical back: Normal range of motion.     Right lower leg: No  edema.     Left lower leg: No edema.  Lymphadenopathy:     Cervical: No cervical adenopathy.  Skin:    General: Skin is warm and dry.     Findings: No rash.  Neurological:     Mental Status: He is alert and oriented to person, place, and time.     Cranial Nerves: No cranial nerve deficit.     Motor: No abnormal muscle tone.     Coordination: Coordination normal.     Gait: Gait normal.     Deep Tendon Reflexes: Reflexes are normal and symmetric.  Psychiatric:        Behavior: Behavior normal.        Thought Content: Thought content normal.        Judgment: Judgment normal.   R nephrostomy    A total time of 45 minutes was spent preparing to see the patient, reviewing tests, x-rays, operative reports and other medical records.  Also, obtaining history and performing comprehensive physical exam.  Additionally, counseling the patient regarding the above listed issues.   Finally, documenting clinical information in the health records, coordination of care, educating the patient, CDL exam, CDL form.   Lab Results  Component Value Date   WBC 8.8 08/19/2023   HGB 16.5 08/19/2023   HCT 49.2 08/19/2023   PLT 190 08/19/2023   GLUCOSE 105 (H) 08/19/2023   CHOL 200 10/08/2021   TRIG 248.0 (H) 10/08/2021   HDL 34.60 (L) 10/08/2021   LDLDIRECT 132.0 10/08/2021   ALT 18 08/19/2023   AST 14 (L) 08/19/2023   NA 135 08/19/2023   K 5.5 (H) 08/19/2023   CL 102 08/19/2023   CREATININE 1.12 08/19/2023   BUN 22 (H) 08/19/2023   CO2 24 08/19/2023   TSH 0.70 11/17/2022   PSA 1.18 10/08/2021   INR 1.1 08/12/2022    IR NEPHROSTOMY EXCHANGE RIGHT Result Date: 12/21/2023 INDICATION: Routine exchange.  nephrostogram for NUS evaluation; routine EXAM: Procedures: 1. ANTEGRADE NEPHROSTOGRAM 2. ABORTED CONVERSION FROM NEPHROSTOMY TO NEPHROURETERAL CATHETER 3. RIGHT NEPHROSTOMY CATHETER EXCHANGE COMPARISON:  IR fluoroscopy, most recently 11/08/2023. CT CAP, 08/19/2023. CONTRAST:  20 mL Isovue-300  administered into the collecting system MEDICATIONS: Ancef 2 g IV. The antibiotic was administered within 60 minutes of procedural initiation ANESTHESIA/SEDATION: Local anesthetic was administered. The patient was continuously monitored during the procedure by the interventional radiology nurse under my direct supervision. FLUOROSCOPY TIME:  Fluoroscopic dose; 14 mGy COMPLICATIONS: None immediate. TECHNIQUE: Informed written consent was obtained from the patient after a discussion of the risks, benefits and alternatives to treatment. Questions regarding the procedure were encouraged and answered. A timeout was performed prior to the initiation  of the procedure. The RIGHT flank and external portion of existing nephrostomy catheter were prepped and draped in the usual sterile fashion. A sterile drape was applied covering the operative field. Maximum barrier sterile technique with sterile gowns and gloves were used for the procedure. A timeout was performed prior to the initiation of the procedure. A pre procedural spot fluoroscopic image was obtained after contrast was injected via the existing nephrostomy catheter demonstrating appropriate positioning within the renal pelvis. The existing nephrostomy catheter was cut and cannulated with a Benson wire which was coiled within the renal pelvis. A 5 Fr Kumpe catheter was placed and with a glidewire past the renal pelvis to the RIGHT ureteropelvic junction. Access into the urinary bladder could not be obtained despite multiple attempts. Contrast would not flow into the urinary bladder. Under intermittent fluoroscopic guidance, a new 10 Fr drainage catheter was placed. Contrast injection confirmed appropriate positioning within the renal pelvis and a post exchange fluoroscopic image was obtained. The catheter was locked and secured to the skin with a StatLock. A dressing was placed. The patient tolerated the procedure well without immediate postprocedural complication.  FINDINGS: *The existing nephrostomy catheter is appropriately positioned and functioning. *Antegrade nephrostogram demonstrating obstruction at the RIGHT ureteropelvic junction, without flow of contrast into the urinary bladder. *Access into the neobladder could not be obtained, with blunt recanalization. Nephroureteral conversion was aborted. *A new nephrostomy catheter was replaced, coiled and locked within the RIGHT renal pelvis. IMPRESSION: 1. Antegrade nephrostogram with RIGHT distal ureteral obstruction at the UPJ. Failed blunt recanalization, and aborted nephroureteral conversion. 2. Successful fluoroscopic-guided exchange of RIGHT 10 Fr nephrostomy catheter. RECOMMENDATIONS: The patient will return to Vascular Interventional Radiology (VIR) for routine drainage catheter evaluation and exchange in 8 weeks. Roanna Banning, MD [facility] Electronically Signed   By: Roanna Banning M.D.   On: 12/21/2023 16:54   IR NEPHROSTOGRAM RIGHT THRU EXISTING ACCESS Result Date: 12/21/2023 INDICATION: Routine exchange.  nephrostogram for NUS evaluation; routine EXAM: Procedures: 1. ANTEGRADE NEPHROSTOGRAM 2. ABORTED CONVERSION FROM NEPHROSTOMY TO NEPHROURETERAL CATHETER 3. RIGHT NEPHROSTOMY CATHETER EXCHANGE COMPARISON:  IR fluoroscopy, most recently 11/08/2023. CT CAP, 08/19/2023. CONTRAST:  20 mL Isovue-300 administered into the collecting system MEDICATIONS: Ancef 2 g IV. The antibiotic was administered within 60 minutes of procedural initiation ANESTHESIA/SEDATION: Local anesthetic was administered. The patient was continuously monitored during the procedure by the interventional radiology nurse under my direct supervision. FLUOROSCOPY TIME:  Fluoroscopic dose; 14 mGy COMPLICATIONS: None immediate. TECHNIQUE: Informed written consent was obtained from the patient after a discussion of the risks, benefits and alternatives to treatment. Questions regarding the  procedure were encouraged and answered. A timeout was performed prior to the initiation of the procedure. The RIGHT flank and external portion of existing nephrostomy catheter were prepped and draped in the usual sterile fashion. A sterile drape was applied covering the operative field. Maximum barrier sterile technique with sterile gowns and gloves were used for the procedure. A timeout was performed prior to the initiation of the procedure. A pre procedural spot fluoroscopic image was obtained after contrast was injected via the existing nephrostomy catheter demonstrating appropriate positioning within the renal pelvis. The existing nephrostomy catheter was cut and cannulated with a Benson wire which was coiled within the renal pelvis. A 5 Fr Kumpe catheter was placed and with a glidewire past the renal pelvis to the RIGHT ureteropelvic junction. Access into the urinary bladder could not be obtained despite multiple attempts. Contrast would not  flow into the urinary bladder. Under intermittent fluoroscopic guidance, a new 10 Fr drainage catheter was placed. Contrast injection confirmed appropriate positioning within the renal pelvis and a post exchange fluoroscopic image was obtained. The catheter was locked and secured to the skin with a StatLock. A dressing was placed. The patient tolerated the procedure well without immediate postprocedural complication. FINDINGS: *The existing nephrostomy catheter is appropriately positioned and functioning. *Antegrade nephrostogram demonstrating obstruction at the RIGHT ureteropelvic junction, without flow of contrast into the urinary bladder. *Access into the neobladder could not be obtained, with blunt recanalization. Nephroureteral conversion was aborted. *A new nephrostomy catheter was replaced, coiled and locked within the RIGHT renal pelvis. IMPRESSION: 1. Antegrade nephrostogram with RIGHT distal ureteral obstruction at the UPJ. Failed blunt recanalization, and aborted  nephroureteral conversion. 2. Successful fluoroscopic-guided exchange of RIGHT 10 Fr nephrostomy catheter. RECOMMENDATIONS: The patient will return to Vascular Interventional Radiology (VIR) for routine drainage catheter evaluation and exchange in 8 weeks. Roanna Banning, MD [facility] Electronically Signed   By: Roanna Banning M.D.   On: 12/21/2023 16:54    Assessment & Plan:   Problem List Items Addressed This Visit     Fatigue   Relevant Orders   TSH   Urinalysis   CBC with Differential/Platelet   Lipid panel   Comprehensive metabolic panel with GFR   Vitamin B12   VITAMIN D 25 Hydroxy (Vit-D Deficiency, Fractures)   Vitamin D deficiency   Relevant Orders   VITAMIN D 25 Hydroxy (Vit-D Deficiency, Fractures)   Bladder tumor   Relevant Orders   TSH   Urinalysis   CBC with Differential/Platelet   Lipid panel   Comprehensive metabolic panel with GFR   Vitamin B12   VITAMIN D 25 Hydroxy (Vit-D Deficiency, Fractures)   Weight loss   Relevant Orders   TSH   Urinalysis   CBC with Differential/Platelet   Lipid panel   Comprehensive metabolic panel with GFR   Vitamin B12   VITAMIN D 25 Hydroxy (Vit-D Deficiency, Fractures)   UTI (urinary tract infection) - Primary   Relevant Orders   TSH   Urinalysis   CBC with Differential/Platelet   Lipid panel   Comprehensive metabolic panel with GFR   Vitamin B12   VITAMIN D 25 Hydroxy (Vit-D Deficiency, Fractures)      Meds ordered this encounter  Medications   traMADol (ULTRAM) 50 MG tablet    Sig: Take 1 tablet (50 mg total) by mouth every 8 (eight) hours as needed for moderate pain (pain score 4-6).    Dispense:  90 tablet    Refill:  1   amoxicillin-clavulanate (AUGMENTIN) 875-125 MG tablet    Sig: Take 1 tablet by mouth 2 (two) times daily.    Dispense:  20 tablet    Refill:  1      Follow-up: No follow-ups on file.  Sonda Primes, MD

## 2023-12-28 NOTE — Assessment & Plan Note (Signed)
 CDL Exam - passed, good x 2 years

## 2023-12-29 ENCOUNTER — Other Ambulatory Visit (INDEPENDENT_AMBULATORY_CARE_PROVIDER_SITE_OTHER): Payer: Self-pay

## 2023-12-29 ENCOUNTER — Telehealth: Payer: Self-pay | Admitting: Pharmacy Technician

## 2023-12-29 ENCOUNTER — Other Ambulatory Visit (HOSPITAL_COMMUNITY): Payer: Self-pay

## 2023-12-29 DIAGNOSIS — R634 Abnormal weight loss: Secondary | ICD-10-CM

## 2023-12-29 DIAGNOSIS — N390 Urinary tract infection, site not specified: Secondary | ICD-10-CM

## 2023-12-29 DIAGNOSIS — D494 Neoplasm of unspecified behavior of bladder: Secondary | ICD-10-CM

## 2023-12-29 DIAGNOSIS — R5382 Chronic fatigue, unspecified: Secondary | ICD-10-CM

## 2023-12-29 DIAGNOSIS — E559 Vitamin D deficiency, unspecified: Secondary | ICD-10-CM

## 2023-12-29 LAB — CBC WITH DIFFERENTIAL/PLATELET
Basophils Absolute: 0.1 10*3/uL (ref 0.0–0.1)
Basophils Relative: 0.7 % (ref 0.0–3.0)
Eosinophils Absolute: 0.2 10*3/uL (ref 0.0–0.7)
Eosinophils Relative: 3 % (ref 0.0–5.0)
HCT: 42 % (ref 39.0–52.0)
Hemoglobin: 14.4 g/dL (ref 13.0–17.0)
Lymphocytes Relative: 32.6 % (ref 12.0–46.0)
Lymphs Abs: 2.7 10*3/uL (ref 0.7–4.0)
MCHC: 34.2 g/dL (ref 30.0–36.0)
MCV: 90.2 fl (ref 78.0–100.0)
Monocytes Absolute: 0.5 10*3/uL (ref 0.1–1.0)
Monocytes Relative: 6 % (ref 3.0–12.0)
Neutro Abs: 4.8 10*3/uL (ref 1.4–7.7)
Neutrophils Relative %: 57.7 % (ref 43.0–77.0)
Platelets: 213 10*3/uL (ref 150.0–400.0)
RBC: 4.66 Mil/uL (ref 4.22–5.81)
RDW: 14 % (ref 11.5–15.5)
WBC: 8.2 10*3/uL (ref 4.0–10.5)

## 2023-12-29 LAB — LIPID PANEL
Cholesterol: 214 mg/dL — ABNORMAL HIGH (ref 0–200)
HDL: 43.1 mg/dL (ref >39.00)
LDL Cholesterol: 119 mg/dL — ABNORMAL HIGH (ref 0–99)
NonHDL: 170.43
Total CHOL/HDL Ratio: 5
Triglycerides: 258 mg/dL — ABNORMAL HIGH (ref 0.0–149.0)
VLDL: 51.6 mg/dL — ABNORMAL HIGH (ref 0.0–40.0)

## 2023-12-29 LAB — COMPREHENSIVE METABOLIC PANEL WITH GFR
ALT: 13 U/L (ref 0–53)
AST: 13 U/L (ref 0–37)
Albumin: 4.4 g/dL (ref 3.5–5.2)
Alkaline Phosphatase: 55 U/L (ref 39–117)
BUN: 24 mg/dL — ABNORMAL HIGH (ref 6–23)
CO2: 28 meq/L (ref 19–32)
Calcium: 9.1 mg/dL (ref 8.4–10.5)
Chloride: 104 meq/L (ref 96–112)
Creatinine, Ser: 1.33 mg/dL (ref 0.40–1.50)
GFR: 58.81 mL/min — ABNORMAL LOW (ref >60.00)
Glucose, Bld: 103 mg/dL — ABNORMAL HIGH (ref 70–99)
Potassium: 4.3 meq/L (ref 3.5–5.1)
Sodium: 138 meq/L (ref 135–145)
Total Bilirubin: 0.4 mg/dL (ref 0.2–1.2)
Total Protein: 6.9 g/dL (ref 6.0–8.3)

## 2023-12-29 LAB — URINALYSIS, ROUTINE W REFLEX MICROSCOPIC
Bilirubin Urine: NEGATIVE
Ketones, ur: NEGATIVE
Nitrite: POSITIVE — AB
Specific Gravity, Urine: 1.025 (ref 1.000–1.030)
Total Protein, Urine: NEGATIVE
Urine Glucose: NEGATIVE
Urobilinogen, UA: 0.2 (ref 0.0–1.0)
pH: 6 (ref 5.0–8.0)

## 2023-12-29 LAB — TSH: TSH: 0.83 u[IU]/mL (ref 0.35–5.50)

## 2023-12-29 LAB — VITAMIN B12: Vitamin B-12: 643 pg/mL (ref 211–911)

## 2023-12-29 LAB — VITAMIN D 25 HYDROXY (VIT D DEFICIENCY, FRACTURES): VITD: 36.08 ng/mL (ref 30.00–100.00)

## 2023-12-29 NOTE — Telephone Encounter (Signed)
 Pharmacy Patient Advocate Encounter   Received notification from CoverMyMeds that prior authorization for RABEprazole Sodium 20MG  dr tablets is required/requested.   Insurance verification completed.   The patient is insured through [facility] .   Per test claim:  OMEPRAZOLE OR PANTOPRAZOLE is preferred by the insurance.  If suggested medication is appropriate, Please send in a new RX and discontinue this one. If not, please advise as to why it's not appropriate so that we may request a Prior Authorization. Please note, some preferred medications may still require a PA.  If the suggested medications have not been trialed and there are no contraindications to their use, the PA will not be submitted, as it will not be approved.

## 2024-01-03 ENCOUNTER — Encounter: Payer: Self-pay | Admitting: Internal Medicine

## 2024-01-04 ENCOUNTER — Other Ambulatory Visit (HOSPITAL_COMMUNITY): Payer: Self-pay

## 2024-01-04 ENCOUNTER — Telehealth: Payer: Self-pay

## 2024-01-04 NOTE — Telephone Encounter (Signed)
 Pharmacy Patient Advocate Encounter   Received notification from Physician's Office that prior authorization for RABEprazole Sodium 20MG  dr tablets is required/requested.   Insurance verification completed.   The patient is insured through [facility] .   Per test claim: PA required; PA submitted to above mentioned insurance via CoverMyMeds Key/confirmation #/EOC [facility] Status is pending

## 2024-01-04 NOTE — Telephone Encounter (Signed)
 Omeprazole and pantoprazole did not work well.  Thanks

## 2024-01-04 NOTE — Telephone Encounter (Signed)
 Pharmacy Patient Advocate Encounter  Received notification from [facility] that Prior Authorization for RABEprazole Sodium 20MG  dr tablets has been APPROVED from 01/04/24 to 01/03/25. Ran test claim, Copay is $15/ 30 day supply. This test claim was processed through [facility]- copay amounts may vary at other pharmacies due to pharmacy/plan contracts, or as the patient moves through the different stages of their insurance plan.   PA #/Case ID/Reference #: [id number]

## 2024-01-05 ENCOUNTER — Encounter (HOSPITAL_COMMUNITY): Payer: Self-pay | Admitting: Radiology

## 2024-01-05 ENCOUNTER — Encounter: Payer: Self-pay | Admitting: Radiology

## 2024-01-05 NOTE — Telephone Encounter (Signed)
 PA request has been Approved. New Encounter has been or will be created for follow up. For additional info see Pharmacy Prior Auth telephone encounter from 01/04/24.

## 2024-01-10 NOTE — Telephone Encounter (Signed)
 Called and left detailed voice message to inform them of prior auth approval for medication rabeprazole

## 2024-02-15 ENCOUNTER — Other Ambulatory Visit: Payer: Self-pay | Admitting: Radiology

## 2024-02-15 ENCOUNTER — Ambulatory Visit (HOSPITAL_COMMUNITY)
Admission: RE | Admit: 2024-02-15 | Discharge: 2024-02-15 | Disposition: A | Discharge status: Home / Self Care | Payer: Self-pay | Source: Ambulatory Visit | Attending: Interventional Radiology | Admitting: Interventional Radiology

## 2024-02-15 DIAGNOSIS — C679 Malignant neoplasm of bladder, unspecified: Secondary | ICD-10-CM

## 2024-02-15 HISTORY — PX: IR NEPHROSTOMY EXCHANGE RIGHT: IMG6070

## 2024-02-15 MED ORDER — IOHEXOL 300 MG/ML  SOLN
50.0000 mL | Freq: Once | INTRAMUSCULAR | Status: AC | PRN
  Administered 2024-02-15: 10 mL

## 2024-03-28 ENCOUNTER — Ambulatory Visit (HOSPITAL_COMMUNITY): Admission: RE | Admit: 2024-03-28 | Payer: 59 | Source: Ambulatory Visit

## 2024-03-28 ENCOUNTER — Encounter (HOSPITAL_COMMUNITY): Payer: Self-pay

## 2024-03-28 ENCOUNTER — Inpatient Hospital Stay: Payer: 59 | Attending: Hematology

## 2024-03-28 DIAGNOSIS — C689 Malignant neoplasm of urinary organ, unspecified: Secondary | ICD-10-CM

## 2024-03-28 DIAGNOSIS — C679 Malignant neoplasm of bladder, unspecified: Secondary | ICD-10-CM | POA: Insufficient documentation

## 2024-03-28 LAB — CBC WITH DIFFERENTIAL/PLATELET
Abs Immature Granulocytes: 0.05 10*3/uL (ref 0.00–0.07)
Basophils Absolute: 0.1 10*3/uL (ref 0.0–0.1)
Basophils Relative: 1 %
Eosinophils Absolute: 0.2 10*3/uL (ref 0.0–0.5)
Eosinophils Relative: 2 %
HCT: 48.8 % (ref 39.0–52.0)
Hemoglobin: 16.2 g/dL (ref 13.0–17.0)
Immature Granulocytes: 1 %
Lymphocytes Relative: 23 %
Lymphs Abs: 2.4 10*3/uL (ref 0.7–4.0)
MCH: 30.7 pg (ref 26.0–34.0)
MCHC: 33.2 g/dL (ref 30.0–36.0)
MCV: 92.4 fL (ref 80.0–100.0)
Monocytes Absolute: 0.6 10*3/uL (ref 0.1–1.0)
Monocytes Relative: 6 %
Neutro Abs: 7.1 10*3/uL (ref 1.7–7.7)
Neutrophils Relative %: 67 %
Platelets: 181 10*3/uL (ref 150–400)
RBC: 5.28 MIL/uL (ref 4.22–5.81)
RDW: 13.2 % (ref 11.5–15.5)
WBC: 10.4 10*3/uL (ref 4.0–10.5)
nRBC: 0 % (ref 0.0–0.2)

## 2024-03-28 LAB — COMPREHENSIVE METABOLIC PANEL WITH GFR
ALT: 15 U/L (ref 0–44)
AST: 16 U/L (ref 15–41)
Albumin: 4.1 g/dL (ref 3.5–5.0)
Alkaline Phosphatase: 52 U/L (ref 38–126)
Anion gap: 10 (ref 5–15)
BUN: 19 mg/dL (ref 6–20)
CO2: 25 mmol/L (ref 22–32)
Calcium: 9.3 mg/dL (ref 8.9–10.3)
Chloride: 102 mmol/L (ref 98–111)
Creatinine, Ser: 1.03 mg/dL (ref 0.61–1.24)
GFR, Estimated: >60 mL/min (ref >60)
Glucose, Bld: 125 mg/dL — ABNORMAL HIGH (ref 70–99)
Potassium: 4.1 mmol/L (ref 3.5–5.1)
Sodium: 137 mmol/L (ref 135–145)
Total Bilirubin: 0.8 mg/dL (ref 0.0–1.2)
Total Protein: 7 g/dL (ref 6.5–8.1)

## 2024-04-04 ENCOUNTER — Inpatient Hospital Stay: Payer: 59 | Admitting: Hematology

## 2024-04-11 ENCOUNTER — Other Ambulatory Visit: Payer: Self-pay | Admitting: Radiology

## 2024-04-11 ENCOUNTER — Ambulatory Visit (HOSPITAL_COMMUNITY)
Admission: RE | Admit: 2024-04-11 | Discharge: 2024-04-11 | Disposition: A | Discharge status: Home / Self Care | Source: Ambulatory Visit | Attending: Interventional Radiology | Admitting: Interventional Radiology

## 2024-04-11 DIAGNOSIS — C679 Malignant neoplasm of bladder, unspecified: Secondary | ICD-10-CM

## 2024-04-11 HISTORY — PX: IR NEPHROSTOMY EXCHANGE RIGHT: IMG6070

## 2024-04-11 MED ORDER — IOHEXOL 300 MG/ML  SOLN
50.0000 mL | Freq: Once | INTRAMUSCULAR | Status: AC | PRN
  Administered 2024-04-11: 20 mL

## 2024-05-05 ENCOUNTER — Observation Stay (HOSPITAL_COMMUNITY)

## 2024-05-05 ENCOUNTER — Encounter (HOSPITAL_COMMUNITY): Payer: Self-pay

## 2024-05-05 ENCOUNTER — Emergency Department (HOSPITAL_COMMUNITY)

## 2024-05-05 ENCOUNTER — Other Ambulatory Visit: Payer: Self-pay

## 2024-05-05 ENCOUNTER — Inpatient Hospital Stay (HOSPITAL_COMMUNITY)
Admission: EM | Admit: 2024-05-05 | Discharge: 2024-05-07 | DRG: 698 | Disposition: A | Discharge status: Home / Self Care | Attending: Family Medicine | Admitting: Family Medicine

## 2024-05-05 DIAGNOSIS — N136 Pyonephrosis: Secondary | ICD-10-CM | POA: Diagnosis present

## 2024-05-05 DIAGNOSIS — A4151 Sepsis due to Escherichia coli [E. coli]: Secondary | ICD-10-CM

## 2024-05-05 DIAGNOSIS — C689 Malignant neoplasm of urinary organ, unspecified: Secondary | ICD-10-CM | POA: Diagnosis present

## 2024-05-05 DIAGNOSIS — Y846 Urinary catheterization as the cause of abnormal reaction of the patient, or of later complication, without mention of misadventure at the time of the procedure: Secondary | ICD-10-CM | POA: Diagnosis present

## 2024-05-05 DIAGNOSIS — D494 Neoplasm of unspecified behavior of bladder: Secondary | ICD-10-CM | POA: Diagnosis present

## 2024-05-05 DIAGNOSIS — Z8261 Family history of arthritis: Secondary | ICD-10-CM | POA: Diagnosis not present

## 2024-05-05 DIAGNOSIS — Z822 Family history of deafness and hearing loss: Secondary | ICD-10-CM

## 2024-05-05 DIAGNOSIS — N390 Urinary tract infection, site not specified: Secondary | ICD-10-CM | POA: Diagnosis present

## 2024-05-05 DIAGNOSIS — C672 Malignant neoplasm of lateral wall of bladder: Secondary | ICD-10-CM | POA: Diagnosis present

## 2024-05-05 DIAGNOSIS — N99521 Infection of other external stoma of urinary tract: Principal | ICD-10-CM | POA: Diagnosis present

## 2024-05-05 DIAGNOSIS — Z8659 Personal history of other mental and behavioral disorders: Secondary | ICD-10-CM | POA: Diagnosis not present

## 2024-05-05 DIAGNOSIS — B9620 Unspecified Escherichia coli [E. coli] as the cause of diseases classified elsewhere: Secondary | ICD-10-CM | POA: Diagnosis present

## 2024-05-05 DIAGNOSIS — R739 Hyperglycemia, unspecified: Secondary | ICD-10-CM | POA: Diagnosis present

## 2024-05-05 DIAGNOSIS — A419 Sepsis, unspecified organism: Secondary | ICD-10-CM | POA: Diagnosis present

## 2024-05-05 DIAGNOSIS — F17200 Nicotine dependence, unspecified, uncomplicated: Secondary | ICD-10-CM | POA: Diagnosis present

## 2024-05-05 DIAGNOSIS — Z818 Family history of other mental and behavioral disorders: Secondary | ICD-10-CM

## 2024-05-05 DIAGNOSIS — A409 Streptococcal sepsis, unspecified: Secondary | ICD-10-CM

## 2024-05-05 DIAGNOSIS — K219 Gastro-esophageal reflux disease without esophagitis: Secondary | ICD-10-CM | POA: Diagnosis present

## 2024-05-05 DIAGNOSIS — F17210 Nicotine dependence, cigarettes, uncomplicated: Secondary | ICD-10-CM | POA: Diagnosis present

## 2024-05-05 DIAGNOSIS — R300 Dysuria: Secondary | ICD-10-CM | POA: Diagnosis present

## 2024-05-05 LAB — CBC WITH DIFFERENTIAL/PLATELET
Abs Immature Granulocytes: 0.06 K/uL (ref 0.00–0.07)
Abs Immature Granulocytes: 0.06 K/uL (ref 0.00–0.07)
Basophils Absolute: 0 K/uL (ref 0.0–0.1)
Basophils Absolute: 0.1 K/uL (ref 0.0–0.1)
Basophils Relative: 0 %
Basophils Relative: 0 %
Eosinophils Absolute: 0.1 K/uL (ref 0.0–0.5)
Eosinophils Absolute: 0 K/uL (ref 0.0–0.5)
Eosinophils Relative: 0 %
Eosinophils Relative: 0 %
HCT: 46.6 % (ref 39.0–52.0)
HCT: 42.7 % (ref 39.0–52.0)
Hemoglobin: 15.8 g/dL (ref 13.0–17.0)
Hemoglobin: 14.3 g/dL (ref 13.0–17.0)
Immature Granulocytes: 0 %
Immature Granulocytes: 0 %
Lymphocytes Relative: 8 %
Lymphocytes Relative: 7 %
Lymphs Abs: 1.2 K/uL (ref 0.7–4.0)
Lymphs Abs: 1 K/uL (ref 0.7–4.0)
MCH: 30.9 pg (ref 26.0–34.0)
MCH: 30.4 pg (ref 26.0–34.0)
MCHC: 33.9 g/dL (ref 30.0–36.0)
MCHC: 33.5 g/dL (ref 30.0–36.0)
MCV: 91 fL (ref 80.0–100.0)
MCV: 90.7 fL (ref 80.0–100.0)
Monocytes Absolute: 1.1 K/uL — ABNORMAL HIGH (ref 0.1–1.0)
Monocytes Absolute: 1.4 K/uL — ABNORMAL HIGH (ref 0.1–1.0)
Monocytes Relative: 8 %
Monocytes Relative: 9 %
Neutro Abs: 11.5 K/uL — ABNORMAL HIGH (ref 1.7–7.7)
Neutro Abs: 12.7 K/uL — ABNORMAL HIGH (ref 1.7–7.7)
Neutrophils Relative %: 84 %
Neutrophils Relative %: 84 %
Platelets: 183 K/uL (ref 150–400)
Platelets: 164 K/uL (ref 150–400)
RBC: 5.12 MIL/uL (ref 4.22–5.81)
RBC: 4.71 MIL/uL (ref 4.22–5.81)
RDW: 13.4 % (ref 11.5–15.5)
RDW: 13.3 % (ref 11.5–15.5)
WBC: 13.9 K/uL — ABNORMAL HIGH (ref 4.0–10.5)
WBC: 15.2 K/uL — ABNORMAL HIGH (ref 4.0–10.5)
nRBC: 0 % (ref 0.0–0.2)
nRBC: 0 % (ref 0.0–0.2)

## 2024-05-05 LAB — URINALYSIS, W/ REFLEX TO CULTURE (INFECTION SUSPECTED)
Bilirubin Urine: NEGATIVE
Glucose, UA: NEGATIVE mg/dL
Ketones, ur: NEGATIVE mg/dL
Nitrite: POSITIVE — AB
Protein, ur: 100 mg/dL — AB
Specific Gravity, Urine: 1.016 (ref 1.005–1.030)
WBC, UA: >50 WBC/hpf (ref 0–5)
pH: 6 (ref 5.0–8.0)

## 2024-05-05 LAB — COMPREHENSIVE METABOLIC PANEL WITH GFR
ALT: 14 U/L (ref 0–44)
ALT: 13 U/L (ref 0–44)
AST: 14 U/L — ABNORMAL LOW (ref 15–41)
AST: 13 U/L — ABNORMAL LOW (ref 15–41)
Albumin: 4.2 g/dL (ref 3.5–5.0)
Albumin: 3.6 g/dL (ref 3.5–5.0)
Alkaline Phosphatase: 55 U/L (ref 38–126)
Alkaline Phosphatase: 49 U/L (ref 38–126)
Anion gap: 13 (ref 5–15)
Anion gap: 13 (ref 5–15)
BUN: 14 mg/dL (ref 6–20)
BUN: 13 mg/dL (ref 6–20)
CO2: 25 mmol/L (ref 22–32)
CO2: 22 mmol/L (ref 22–32)
Calcium: 9.2 mg/dL (ref 8.9–10.3)
Calcium: 8.7 mg/dL — ABNORMAL LOW (ref 8.9–10.3)
Chloride: 98 mmol/L (ref 98–111)
Chloride: 100 mmol/L (ref 98–111)
Creatinine, Ser: 1.04 mg/dL (ref 0.61–1.24)
Creatinine, Ser: 1.01 mg/dL (ref 0.61–1.24)
GFR, Estimated: >60 mL/min (ref >60)
GFR, Estimated: >60 mL/min (ref >60)
Glucose, Bld: 112 mg/dL — ABNORMAL HIGH (ref 70–99)
Glucose, Bld: 118 mg/dL — ABNORMAL HIGH (ref 70–99)
Potassium: 3.8 mmol/L (ref 3.5–5.1)
Potassium: 3.5 mmol/L (ref 3.5–5.1)
Sodium: 136 mmol/L (ref 135–145)
Sodium: 135 mmol/L (ref 135–145)
Total Bilirubin: 1.3 mg/dL — ABNORMAL HIGH (ref 0.0–1.2)
Total Bilirubin: 1.5 mg/dL — ABNORMAL HIGH (ref 0.0–1.2)
Total Protein: 7.6 g/dL (ref 6.5–8.1)
Total Protein: 6.7 g/dL (ref 6.5–8.1)

## 2024-05-05 LAB — PROTIME-INR
INR: 1.1 (ref 0.8–1.2)
Prothrombin Time: 14.5 s (ref 11.4–15.2)

## 2024-05-05 LAB — APTT: aPTT: 45 s — ABNORMAL HIGH (ref 24–36)

## 2024-05-05 LAB — LACTIC ACID, PLASMA
Lactic Acid, Venous: 1 mmol/L (ref 0.5–1.9)
Lactic Acid, Venous: 1 mmol/L (ref 0.5–1.9)
Lactic Acid, Venous: 1 mmol/L (ref 0.5–1.9)
Lactic Acid, Venous: 1.2 mmol/L (ref 0.5–1.9)

## 2024-05-05 LAB — HIV ANTIBODY (ROUTINE TESTING W REFLEX): HIV Screen 4th Generation wRfx: NONREACTIVE

## 2024-05-05 MED ORDER — LACTATED RINGERS IV BOLUS (SEPSIS)
1000.0000 mL | Freq: Once | INTRAVENOUS | Status: AC
  Administered 2024-05-05: 1000 mL via INTRAVENOUS

## 2024-05-05 MED ORDER — SODIUM CHLORIDE 0.9 % IV SOLN
2.0000 g | Freq: Three times a day (TID) | INTRAVENOUS | Status: DC
  Administered 2024-05-05 – 2024-05-07 (×6): 2 g via INTRAVENOUS
  Filled 2024-05-05 (×6): qty 12.5

## 2024-05-05 MED ORDER — LACTATED RINGERS IV BOLUS (SEPSIS)
500.0000 mL | Freq: Once | INTRAVENOUS | Status: AC
  Administered 2024-05-05: 500 mL via INTRAVENOUS

## 2024-05-05 MED ORDER — SODIUM CHLORIDE 0.9% FLUSH
3.0000 mL | Freq: Two times a day (BID) | INTRAVENOUS | Status: DC
  Administered 2024-05-06 – 2024-05-07 (×3): 3 mL via INTRAVENOUS

## 2024-05-05 MED ORDER — ENOXAPARIN SODIUM 60 MG/0.6ML IJ SOSY
55.0000 mg | PREFILLED_SYRINGE | INTRAMUSCULAR | Status: DC
  Administered 2024-05-05: 55 mg via SUBCUTANEOUS
  Filled 2024-05-05: qty 0.6

## 2024-05-05 MED ORDER — LACTATED RINGERS IV BOLUS
1000.0000 mL | Freq: Once | INTRAVENOUS | Status: AC
  Administered 2024-05-05: 1000 mL via INTRAVENOUS

## 2024-05-05 MED ORDER — NICOTINE 21 MG/24HR TD PT24
21.0000 mg | MEDICATED_PATCH | Freq: Every day | TRANSDERMAL | Status: DC | PRN
  Administered 2024-05-05 – 2024-05-06 (×2): 21 mg via TRANSDERMAL
  Filled 2024-05-05 (×2): qty 1

## 2024-05-05 MED ORDER — ONDANSETRON HCL 4 MG PO TABS: 4.0000 mg | ORAL_TABLET | Freq: Four times a day (QID) | ORAL | Status: DC | PRN

## 2024-05-05 MED ORDER — LACTATED RINGERS IV BOLUS (SEPSIS): 1000.0000 mL | Freq: Once | INTRAVENOUS | Status: DC

## 2024-05-05 MED ORDER — SODIUM CHLORIDE 0.9 % IV SOLN: 2.0000 g | Freq: Once | INTRAVENOUS | Status: DC

## 2024-05-05 MED ORDER — LACTATED RINGERS IV SOLN: INTRAVENOUS | Status: DC

## 2024-05-05 MED ORDER — IOHEXOL 300 MG/ML  SOLN
100.0000 mL | Freq: Once | INTRAMUSCULAR | Status: AC | PRN
  Administered 2024-05-05: 100 mL via INTRAVENOUS

## 2024-05-05 MED ORDER — ONDANSETRON HCL 4 MG/2ML IJ SOLN: 4.0000 mg | Freq: Four times a day (QID) | INTRAMUSCULAR | Status: DC | PRN

## 2024-05-05 MED ORDER — ENOXAPARIN SODIUM 40 MG/0.4ML IJ SOSY: 40.0000 mg | PREFILLED_SYRINGE | INTRAMUSCULAR | Status: DC

## 2024-05-05 MED ORDER — SODIUM CHLORIDE 0.9 % IV SOLN: 2.0000 g | INTRAVENOUS | Status: DC

## 2024-05-05 MED ORDER — ACETAMINOPHEN 325 MG PO TABS
650.0000 mg | ORAL_TABLET | Freq: Four times a day (QID) | ORAL | Status: DC | PRN
  Administered 2024-05-05 (×2): 650 mg via ORAL
  Filled 2024-05-05 (×2): qty 2

## 2024-05-05 MED ORDER — LACTATED RINGERS IV SOLN
150.0000 mL/h | INTRAVENOUS | Status: DC
  Administered 2024-05-05 – 2024-05-06 (×3): 150 mL/h via INTRAVENOUS

## 2024-05-05 MED ORDER — ACETAMINOPHEN 650 MG RE SUPP
650.0000 mg | Freq: Four times a day (QID) | RECTAL | Status: DC | PRN
Start: 2024-05-05 — End: 2024-05-07

## 2024-05-05 MED ORDER — SODIUM CHLORIDE 0.9 % IV SOLN
2.0000 g | Freq: Once | INTRAVENOUS | Status: AC
  Administered 2024-05-05: 2 g via INTRAVENOUS
  Filled 2024-05-05: qty 20

## 2024-05-05 NOTE — TOC CM/SW Note (Signed)
 Transition of Care [facility]) - Inpatient Brief Assessment   Patient Details  Name: Christian Kennedy MRN: [id number] Date of Birth: 08-24-1966  Transition of Care [facility]) CM/SW Contact:    Lucie Lunger, LCSWA Phone Number: 05/05/2024, 10:07 AM   Clinical Narrative: Transition of Care Department [facility]) has reviewed patient and no TOC needs have been identified at this time. We will continue to monitor patient advancement through interdiciplinary progression rounds. If new patient transition needs arise, please place a TOC consult.  Transition of Care Asessment: Insurance and Status: Insurance coverage has been reviewed Patient has primary care physician: Yes Home environment has been reviewed: From home Prior level of function:: Independent Prior/Current Home Services: No current home services Social Drivers of Health Review: SDOH reviewed no interventions necessary Readmission risk has been reviewed: Yes Transition of care needs: no transition of care needs at this time

## 2024-05-05 NOTE — Hospital Course (Addendum)
 Christian Kennedy is a 58 y.o. male with medical history significant of urothelial carcinoma s/p TURBT 2019 and 2023 and s/p right percutaneous nephrostomy tube Aug 2023, tobacco use, GERD who presented to the ED for worsening dysuria and urinary frequency. He reports about a week of intermittent but worsening severity now severe burning with urination and going about 10 times per night. No hematuria or pyuria. Felt some chills, low grade fever at home. Started feeling my kidney with right flank discomfort so presented to the ED last night. In the ED he was tachycardic with leukocytosis and urinalysis suggestive of UTI. Admission was requested when tachycardia persisted despite IV fluids and patient's symptoms were unimproved.    SCr 1.04, glucose 112, TBili 1.3 with no other LFT elevations or abnormalities on CMP. Lactate 1.0. WBC 13.9k (PMN 11.5k). Urinalysis: >50 WBCs w/clumps, 21-50 RBCs. Few bacteria.     Assessment and Plan:  Complicated UTI:   - Continue ceftriaxone  2g IV q24h pending urine and blood culture data.  - Give IVF for persistent tachycardia. Lactate reassuring.  - Given his hardware in situ, check CT to r/o abscess.    Urothelial carcinoma: s/p TURBT x2 and PCN 2023 s/p last exchange by IR 04/11/2024.   - Tx is surveillance with Dr. Rogers currently, also follows with Dr. Sherrilee. PCN appears to be functioning well.    GERD:  - PPI   Tobacco use: Precontemplative for additional attempt, as this and fishing are his only stress relievers. ~1ppd currently, been smoking for 50 years he says.  - Nicotine  patch declined, will order prn - Cessation counseling provided. He's been counseled extensively by Dr. Garald as well.    Hyperglycemia:  Pt reports fasting for religious reasons. Glucose random in ED is therefore elevated fasting level.  Given his BMI 30.62,  will check HbA1c.

## 2024-05-05 NOTE — Consult Note (Signed)
 Urology Consult   Physician requesting consult: Dr. Willette, MD  Reason for consult: UTI  History of Present Illness: Christian Kennedy is a 59 y.o. with a history of muscle invasive bladder cancer followed by Dr. Sherrilee who has chosen palliative therapy.  He underwent TURBT in 10/2022 that revealed muscle-invasive bladder cancer.  He also has right ureteral obstruction managed with chronic indwelling nephrostomy tube.  This leg patient was last seen by Dr. Sherrilee in 10/2022.  Patient states that he has regular nephrostomy tube exchanges last episode a couple weeks ago.  He presented the ED today with signs of sepsis due to UTI and reported dysuria, fevers and chills.  He was found to be tachycardic in the 120s and febrile to 103 earlier today.  CT A/P revealed persistent expected mass in the bladder as well as nephrostomy tube in appropriate position with no evidence of hydronephrosis on the right.  There were no evidence of abscesses or hydronephrosis.  Patient denies any abdominal pain or flank pain to me.  He states he has having some dysuria however reports emptying his bladder well.  He does not wish to proceed with Foley catheter unless absolutely necessary.  Past Medical History:  Diagnosis Date   Abscess    Right buttocks   Bladder tumor    Depression    Son passed 2014   Ear infection    Eating disorder    Frequent headaches    GERD (gastroesophageal reflux disease)    Helicobacter pylori ab+ 10/27/2017   Hepatitis    childhood, jaundice   Migraines    Urinary incontinence    Varicose vein of leg    Right leg   Vitamin D  deficiency     Past Surgical History:  Procedure Laterality Date   CYSTOSCOPY W/ RETROGRADES Bilateral 11/12/2022   Procedure: CYSTOSCOPY WITH RETROGRADE PYELOGRAM;  Surgeon: Sherrilee Belvie CROME, MD;  Location: [facility];  Service: Urology;  Laterality: Bilateral;   CYSTOSCOPY W/ URETERAL STENT PLACEMENT Right 11/19/2017   Procedure: CYSTOSCOPY WITH  RETROGRADE PYELOGRAM/URETERAL STENT PLACEMENT;  Surgeon: Devere Lonni Righter, MD;  Location: [facility];  Service: Urology;  Laterality: Right;   CYSTOSCOPY WITH FULGERATION N/A 11/19/2017   Procedure: CYSTOSCOPY WITH FULGERATION AND CLOT EVACUATION;  Surgeon: Chauncey Redell Agent, MD;  Location: [facility];  Service: Urology;  Laterality: N/A;   IR NEPHROSTOMY EXCHANGE RIGHT  07/20/2022   IR NEPHROSTOMY EXCHANGE RIGHT  10/06/2022   IR NEPHROSTOMY EXCHANGE RIGHT  12/01/2022   IR NEPHROSTOMY EXCHANGE RIGHT  02/02/2023   IR NEPHROSTOMY EXCHANGE RIGHT  04/06/2023   IR NEPHROSTOMY EXCHANGE RIGHT  04/20/2023   IR NEPHROSTOMY EXCHANGE RIGHT  06/15/2023   IR NEPHROSTOMY EXCHANGE RIGHT  08/17/2023   IR NEPHROSTOMY EXCHANGE RIGHT  09/17/2023   IR NEPHROSTOMY EXCHANGE RIGHT  11/08/2023   IR NEPHROSTOMY EXCHANGE RIGHT  12/21/2023   IR NEPHROSTOMY EXCHANGE RIGHT  02/15/2024   IR NEPHROSTOMY EXCHANGE RIGHT  04/11/2024   IR NEPHROSTOMY PLACEMENT RIGHT  05/26/2022   TRANSURETHRAL RESECTION OF BLADDER TUMOR N/A 11/19/2017   Procedure: TRANSURETHRAL RESECTION OF BLADDER TUMOR (TURBT);  Surgeon: Devere Lonni Righter, MD;  Location[facility];  Service: Urology;  Laterality: N/A;   TRANSURETHRAL RESECTION OF BLADDER TUMOR N/A 05/18/2022   Procedure: TRANSURETHRAL RESECTION OF BLADDER TUMOR (TURBT);  Surgeon: Sherrilee Belvie CROME, MD;  Location: [facility];  Service: Urology;  Laterality: N/A;   TRANSURETHRAL RESECTION OF BLADDER TUMOR N/A 11/12/2022   Procedure: TRANSURETHRAL RESECTION  OF BLADDER TUMOR (TURBT);  Surgeon: Sherrilee Belvie CROME, MD;  Location: [facility];  Service: Urology;  Laterality: N/A;    Current Hospital Medications:  Home Meds:  No current facility-administered medications on file prior to encounter.   Current Outpatient Medications on File Prior to Encounter  Medication Sig Dispense Refill   Aspirin-Acetaminophen -Caffeine (GOODYS EXTRA STRENGTH) 520-260-32.5 MG PACK Take  1-2 Packages by mouth every 6 (six) hours as needed (Pain).     b complex vitamins capsule Take 1 capsule by mouth daily.     BLACK CURRANT SEED OIL PO Take 1 capsule by mouth daily.     Cholecalciferol (VITAMIN D3) 50 MCG (2000 UT) CAPS Take 1 capsule (2,000 Units total) by mouth daily. 100 capsule 3     Scheduled Meds:  sodium chloride  flush  3 mL Intravenous Q12H   Continuous Infusions:  ceFEPime  (MAXIPIME ) IV 2 g (05/05/24 1141)   lactated ringers  150 mL/hr (05/05/24 1440)   PRN Meds:.acetaminophen  **OR** acetaminophen , nicotine , ondansetron  **OR** ondansetron  (ZOFRAN ) IV  Allergies:  Allergies  Allergen Reactions   Macrobid  [Nitrofurantoin ]     ?epig abd pain    Family History  Problem Relation Age of Onset   Cancer Mother    Depression Mother    Arthritis Father    Depression Father    Hearing loss Father    Depression Daughter    Early death Son     Social History:  reports that he has been smoking cigarettes. He has a 30 pack-year smoking history. He has never used smokeless tobacco. He reports that he does not drink alcohol and does not use drugs.  ROS: A complete review of systems was performed.  All systems are negative except for pertinent findings as noted.  Physical Exam:  Vital signs in last 24 hours: Temp:  [98.5 F (36.9 C)-103.2 F (39.6 C)] 99.7 F (37.6 C) (08/08 1547) Pulse Rate:  [109-125] 109 (08/08 1547) Resp:  [17-29] 24 (08/08 1547) BP: (118-135)/(62-86) 123/69 (08/08 1547) SpO2:  [95 %-100 %] 96 % (08/08 1547) Weight:  [111.1 kg-113.4 kg] 113.4 kg (08/08 0917) Constitutional:  Alert and oriented, No acute distress Cardiovascular: Regular rate and rhythm Respiratory: Normal respiratory effort, Lungs clear bilaterally GI: Abdomen is soft, nontender, nondistended, no abdominal masses GU: No CVA tenderness; right PCN in place clean dry and intact, draining clear yellow urine Neurologic: Grossly intact, no focal deficits Psychiatric:  Normal mood and affect  Laboratory Data:  Recent Labs    05/05/24 0510 05/05/24 1123  WBC 13.9* 15.2*  HGB 15.8 14.3  HCT 46.6 42.7  PLT 183 164    Recent Labs    05/05/24 0510 05/05/24 1123  NA 136 135  K 3.8 3.5  CL 98 100  GLUCOSE 112* 118*  BUN 14 13  CALCIUM 9.2 8.7*  CREATININE 1.04 1.01     Results for orders placed or performed during the hospital encounter of 05/05/24 (from the past 24 hours)  Urinalysis, w/ Reflex to Culture (Infection Suspected) -Urine, Clean Catch     Status: Abnormal   Collection Time: 05/05/24  4:50 AM  Result Value Ref Range   Specimen Source URINE, CLEAN CATCH    Color, Urine YELLOW YELLOW   APPearance TURBID (A) CLEAR   Specific Gravity, Urine 1.016 1.005 - 1.030   pH 6.0 5.0 - 8.0   Glucose, UA NEGATIVE NEGATIVE mg/dL   Hgb urine dipstick SMALL (A) NEGATIVE   Bilirubin Urine NEGATIVE NEGATIVE   Ketones, ur  NEGATIVE NEGATIVE mg/dL   Protein, ur 899 (A) NEGATIVE mg/dL   Nitrite POSITIVE (A) NEGATIVE   Leukocytes,Ua MODERATE (A) NEGATIVE   RBC / HPF 21-50 0 - 5 RBC/hpf   WBC, UA >50 0 - 5 WBC/hpf   Bacteria, UA FEW (A) NONE SEEN   Squamous Epithelial / HPF 0-5 0 - 5 /HPF   WBC Clumps PRESENT   Lactic acid, plasma     Status: None   Collection Time: 05/05/24  5:10 AM  Result Value Ref Range   Lactic Acid, Venous 1.0 0.5 - 1.9 mmol/L  Comprehensive metabolic panel     Status: Abnormal   Collection Time: 05/05/24  5:10 AM  Result Value Ref Range   Sodium 136 135 - 145 mmol/L   Potassium 3.8 3.5 - 5.1 mmol/L   Chloride 98 98 - 111 mmol/L   CO2 25 22 - 32 mmol/L   Glucose, Bld 112 (H) 70 - 99 mg/dL   BUN 14 6 - 20 mg/dL   Creatinine, Ser 8.95 0.61 - 1.24 mg/dL   Calcium 9.2 8.9 - 89.6 mg/dL   Total Protein 7.6 6.5 - 8.1 g/dL   Albumin 4.2 3.5 - 5.0 g/dL   AST 14 (L) 15 - 41 U/L   ALT 14 0 - 44 U/L   Alkaline Phosphatase 55 38 - 126 U/L   Total Bilirubin 1.3 (H) 0.0 - 1.2 mg/dL   GFR, Estimated >39 >39 mL/min   Anion gap  13 5 - 15  CBC with Differential     Status: Abnormal   Collection Time: 05/05/24  5:10 AM  Result Value Ref Range   WBC 13.9 (H) 4.0 - 10.5 K/uL   RBC 5.12 4.22 - 5.81 MIL/uL   Hemoglobin 15.8 13.0 - 17.0 g/dL   HCT 53.3 60.9 - 47.9 %   MCV 91.0 80.0 - 100.0 fL   MCH 30.9 26.0 - 34.0 pg   MCHC 33.9 30.0 - 36.0 g/dL   RDW 86.5 88.4 - 84.4 %   Platelets 183 150 - 400 K/uL   nRBC 0.0 0.0 - 0.2 %   Neutrophils Relative % 84 %   Neutro Abs 11.5 (H) 1.7 - 7.7 K/uL   Lymphocytes Relative 8 %   Lymphs Abs 1.2 0.7 - 4.0 K/uL   Monocytes Relative 8 %   Monocytes Absolute 1.1 (H) 0.1 - 1.0 K/uL   Eosinophils Relative 0 %   Eosinophils Absolute 0.1 0.0 - 0.5 K/uL   Basophils Relative 0 %   Basophils Absolute 0.0 0.0 - 0.1 K/uL   Immature Granulocytes 0 %   Abs Immature Granulocytes 0.06 0.00 - 0.07 K/uL  Protime-INR     Status: None   Collection Time: 05/05/24  5:10 AM  Result Value Ref Range   Prothrombin Time 14.5 11.4 - 15.2 seconds   INR 1.1 0.8 - 1.2  Blood Culture (routine x 2)     Status: None (Preliminary result)   Collection Time: 05/05/24  5:10 AM   Specimen: BLOOD  Result Value Ref Range   Specimen Description BLOOD BLOOD RIGHT ARM    Special Requests      BOTTLES DRAWN AEROBIC AND ANAEROBIC Blood Culture adequate volume   Culture      NO GROWTH < 12 HOURS Performed at [facility], [address], [place], [place] [address]    Report Status PENDING   Blood Culture (routine x 2)     Status: None (Preliminary result)   Collection  Time: 05/05/24  5:42 AM   Specimen: BLOOD  Result Value Ref Range   Specimen Description BLOOD BLOOD LEFT ARM    Special Requests      BOTTLES DRAWN AEROBIC AND ANAEROBIC Blood Culture adequate volume   Culture      NO GROWTH < 12 HOURS Performed at [facility], [address] [place], [place] [address]    Report Status PENDING   HIV Antibody (routine testing w rflx)     Status: None   Collection Time: 05/05/24  5:42 AM  Result Value  Ref Range   HIV Screen 4th Generation wRfx Non Reactive Non Reactive  Lactic acid, plasma     Status: None   Collection Time: 05/05/24  7:11 AM  Result Value Ref Range   Lactic Acid, Venous 1.0 0.5 - 1.9 mmol/L  CBC with Differential     Status: Abnormal   Collection Time: 05/05/24 11:23 AM  Result Value Ref Range   WBC 15.2 (H) 4.0 - 10.5 K/uL   RBC 4.71 4.22 - 5.81 MIL/uL   Hemoglobin 14.3 13.0 - 17.0 g/dL   HCT 57.2 60.9 - 47.9 %   MCV 90.7 80.0 - 100.0 fL   MCH 30.4 26.0 - 34.0 pg   MCHC 33.5 30.0 - 36.0 g/dL   RDW 86.6 88.4 - 84.4 %   Platelets 164 150 - 400 K/uL   nRBC 0.0 0.0 - 0.2 %   Neutrophils Relative % 84 %   Neutro Abs 12.7 (H) 1.7 - 7.7 K/uL   Lymphocytes Relative 7 %   Lymphs Abs 1.0 0.7 - 4.0 K/uL   Monocytes Relative 9 %   Monocytes Absolute 1.4 (H) 0.1 - 1.0 K/uL   Eosinophils Relative 0 %   Eosinophils Absolute 0.0 0.0 - 0.5 K/uL   Basophils Relative 0 %   Basophils Absolute 0.1 0.0 - 0.1 K/uL   Immature Granulocytes 0 %   Abs Immature Granulocytes 0.06 0.00 - 0.07 K/uL  Comprehensive metabolic panel     Status: Abnormal   Collection Time: 05/05/24 11:23 AM  Result Value Ref Range   Sodium 135 135 - 145 mmol/L   Potassium 3.5 3.5 - 5.1 mmol/L   Chloride 100 98 - 111 mmol/L   CO2 22 22 - 32 mmol/L   Glucose, Bld 118 (H) 70 - 99 mg/dL   BUN 13 6 - 20 mg/dL   Creatinine, Ser 8.98 0.61 - 1.24 mg/dL   Calcium 8.7 (L) 8.9 - 10.3 mg/dL   Total Protein 6.7 6.5 - 8.1 g/dL   Albumin 3.6 3.5 - 5.0 g/dL   AST 13 (L) 15 - 41 U/L   ALT 13 0 - 44 U/L   Alkaline Phosphatase 49 38 - 126 U/L   Total Bilirubin 1.5 (H) 0.0 - 1.2 mg/dL   GFR, Estimated >39 >39 mL/min   Anion gap 13 5 - 15  Lactic acid, plasma     Status: None   Collection Time: 05/05/24 11:23 AM  Result Value Ref Range   Lactic Acid, Venous 1.0 0.5 - 1.9 mmol/L  APTT     Status: Abnormal   Collection Time: 05/05/24 11:23 AM  Result Value Ref Range   aPTT 45 (H) 24 - 36 seconds  Lactic acid,  plasma     Status: None   Collection Time: 05/05/24  1:09 PM  Result Value Ref Range   Lactic Acid, Venous 1.2 0.5 - 1.9 mmol/L   Recent Results (from the past  240 hours)  Blood Culture (routine x 2)     Status: None (Preliminary result)   Collection Time: 05/05/24  5:10 AM   Specimen: BLOOD  Result Value Ref Range Status   Specimen Description BLOOD BLOOD RIGHT ARM  Final   Special Requests   Final    BOTTLES DRAWN AEROBIC AND ANAEROBIC Blood Culture adequate volume   Culture   Final    NO GROWTH < 12 HOURS Performed at [facility], [address], [place], [place] [address]    Report Status PENDING  Incomplete  Blood Culture (routine x 2)     Status: None (Preliminary result)   Collection Time: 05/05/24  5:42 AM   Specimen: BLOOD  Result Value Ref Range Status   Specimen Description BLOOD BLOOD LEFT ARM  Final   Special Requests   Final    BOTTLES DRAWN AEROBIC AND ANAEROBIC Blood Culture adequate volume   Culture   Final    NO GROWTH < 12 HOURS Performed at [facility], [address] [place], [place] [address]    Report Status PENDING  Incomplete    Renal Function: Recent Labs    05/05/24 0510 05/05/24 1123  CREATININE 1.04 1.01   Estimated Creatinine Clearance: 107 mL/min (by C-G formula based on SCr of 1.01 mg/dL).  Radiologic Imaging: CT ABDOMEN PELVIS W CONTRAST Result Date: 05/05/2024 EXAM: CT ABDOMEN AND PELVIS WITH CONTRAST 05/05/2024 07:34:48 AM TECHNIQUE: CT of the abdomen and pelvis was performed with the administration of intravenous contrast. Multiplanar reformatted images are provided for review. Automated exposure control, iterative reconstruction, and/or weight based adjustment of the mA/kV was utilized to reduce the radiation dose to as low as reasonably achievable. COMPARISON: 08/19/2023 CLINICAL HISTORY: Sepsis. Bladder pain possible UTI, chronic right nephrostomy. FINDINGS: LOWER CHEST: Minimal dependent atelectasis in the lung bases. LIVER: The liver  is unremarkable. GALLBLADDER AND BILE DUCTS: Gallbladder is unremarkable. No biliary ductal dilatation. SPLEEN: No acute abnormality. PANCREAS: No acute abnormality. ADRENAL GLANDS: No acute abnormality. KIDNEYS, URETERS AND BLADDER: Right pigtail nephrostomy catheter stable in position. Mild right renal parenchymal atrophy as before. Mass-like hyperdensity along the posterior and right lateral wall of the urinary bladder may represent a mass or clot. No stones in the kidneys or ureters. No hydronephrosis. No perinephric or periureteral stranding. GI AND BOWEL: Stomach demonstrates no acute abnormality. There is no bowel obstruction. No bowel wall thickening. PERITONEUM AND RETROPERITONEUM: No ascites. No free air. VASCULATURE: Scattered coronary calcifications. Mild scattered aortoiliac calcified plaque without AAA. LYMPH NODES: No lymphadenopathy. REPRODUCTIVE ORGANS: No acute abnormality. BONES AND SOFT TISSUES: Mild bilateral hip degenerative joint disease. No acute osseous abnormality. No focal soft tissue abnormality. IMPRESSION: 1. Mass-like hyperdensity along the posterior and right lateral wall of the urinary bladder, possibly representing a mass and/or clot. 2. Stable right pigtail nephrostomy catheter in position with mild right renal parenchymal atrophy as before. Electronically signed by: Katheleen Faes MD 05/05/2024 07:41 AM EDT RP Workstation: [id number]   DG Chest Port 1 View Result Date: 05/05/2024 CLINICAL DATA:  Questionable sepsis.  Check for pneumonia. EXAM: PORTABLE CHEST 1 VIEW COMPARISON:  PA Lat chest 08/11/2022 FINDINGS: The heart size and mediastinal contours are within normal limits. The tips of the CP sulci were excluded from the exam. There is no substantial pleural effusion. There is linear scarring or atelectasis in the lateral left base. The lungs are otherwise clear. There is thoracic spondylosis. Overlying telemetry leads. IMPRESSION: Linear scarring or atelectasis in the lateral  left  base. No evidence of acute chest process. Electronically Signed   By: Francis Quam M.D.   On: 05/05/2024 05:04    I independently reviewed the above imaging studies.  Impression/Recommendation Sepsis due to UTI 2.  Muscle invasive bladder cancer managed with palliative care 3.  Chronic right ureteral obstruction managed with right nephrostomy tube  -Reviewed CT imaging today with no evidence of any abscess or hydronephrosis.  Nephrostomy tube is draining appropriately with clear yellow urine. - Discussed option with patient with Foley catheter for maximal draining however he reports he is voiding fine.  Discussed with nursing staff to obtain bladder scan and as long as it is adequate, no need for Foley catheter placement. - Continue IV antibiotics and wean as able.  Would likely require 2-week antibiotic therapy. - Continue with regular right nephrostomy tube exchanges. - Will notify Dr. Sherrilee of his admission. - Urology is following peripherally.  Please call with questions.  Matt R. Caston Coopersmith MD 05/05/2024, 4:37 PM  [facility]  Pager: [phone]

## 2024-05-05 NOTE — ED Provider Notes (Signed)
 [place] EMERGENCY DEPARTMENT AT [facility] Provider Note   CSN: [id number] Arrival date & time: 05/05/24  9580     Patient presents with: Dysuria   Christian Kennedy is a 58 y.o. male.   The history is provided by the patient.  Dysuria Presenting symptoms: dysuria    He has history of GERD, bladder tumor, right nephrostomy and comes in complaining of possible UTI.  He states that for the last 2 weeks he has had dysuria with associated urinary urgency and frequency and tenesmus.  He denies any abdominal pain or flank pain.  He has a chronic right-sided nephrostomy which has been draining clear urine.  He has had chills and temperatures as high as 100.0.  He denies sweats.  He has had problems with urinary tract infections in the past.    Prior to Admission medications   Medication Sig Start Date End Date Taking? Authorizing Provider  amoxicillin -clavulanate (AUGMENTIN ) 875-125 MG tablet Take 1 tablet by mouth 2 (two) times daily. 12/27/23   Plotnikov, Aleksei V, MD  b complex vitamins capsule Take 1 capsule by mouth daily.    [provider]  BLACK CURRANT SEED OIL PO Take 1 capsule by mouth daily.    [provider]  chlorhexidine  (HIBICLENS ) 4 % external liquid Apply topically daily as needed (use for skin cleaning). 04/13/23   Plotnikov, Aleksei V, MD  Cholecalciferol (VITAMIN D3) 50 MCG (2000 UT) CAPS Take 1 capsule (2,000 Units total) by mouth daily. 11/17/22   Plotnikov, [name], MD  diazepam  (VALIUM ) 5 MG tablet Take 1 tablet (5 mg total) by mouth 2 (two) times daily as needed (insomnia or anxiety). 04/13/23   Plotnikov, Aleksei V, MD  mupirocin  ointment (BACTROBAN ) 2 % On leg wound w/dressing change qd if seeing pus, if infected 04/13/23   Plotnikov, [name], MD  RABEprazole  (ACIPHEX ) 20 MG tablet Take 1 tablet (20 mg total) by mouth daily. 12/27/23   Plotnikov, Aleksei V, MD  sodium chloride  flush (NS) 0.9 % SOLN 10 ml syr use as directed 07/07/22    Plotnikov, Aleksei V, MD  traMADol  (ULTRAM ) 50 MG tablet Take 1 tablet (50 mg total) by mouth every 8 (eight) hours as needed for moderate pain (pain score 4-6). 12/27/23   Plotnikov, Aleksei V, MD    Allergies: Macrobid  Brook.Bur ]    Review of Systems  Genitourinary:  Positive for dysuria.  All other systems reviewed and are negative.   Updated Vital Signs BP 118/86   Pulse (!) 114   Temp 98.8 F (37.1 C)   Resp 17   Ht 6' 3 (1.905 m)   Wt 111.1 kg   SpO2 98%   BMI 30.62 kg/m   Physical Exam Vitals and nursing note reviewed.   58 year old male, resting comfortably and in no acute distress. Vital signs are significant for elevated heart rate. Oxygen saturation is 98%, which is normal. Head is normocephalic and atraumatic. PERRLA, EOMI.  Neck is nontender and supple without adenopathy. Back is nontender and there is no CVA tenderness.  Percutaneous nephrostomy tube is present on the right. Lungs are clear without rales, wheezes, or rhonchi. Chest is nontender. Heart has regular rate and rhythm without murmur. Abdomen is soft, flat, nontender. Extremities have no cyanosis or edema, full range of motion is present. Skin is warm and dry without rash. Neurologic: Mental status is normal, cranial nerves are intact, moves all extremities equally.  (all labs ordered are listed, but only  abnormal results are displayed) Labs Reviewed  COMPREHENSIVE METABOLIC PANEL WITH GFR - Abnormal; Notable for the following components:      Result Value   Glucose, Bld 112 (*)    AST 14 (*)    Total Bilirubin 1.3 (*)    All other components within normal limits  CBC WITH DIFFERENTIAL/PLATELET - Abnormal; Notable for the following components:   WBC 13.9 (*)    Neutro Abs 11.5 (*)    Monocytes Absolute 1.1 (*)    All other components within normal limits  URINALYSIS, W/ REFLEX TO CULTURE (INFECTION SUSPECTED) - Abnormal; Notable for the following components:   APPearance TURBID (*)     Hgb urine dipstick SMALL (*)    Protein, ur 100 (*)    Nitrite POSITIVE (*)    Leukocytes,Ua MODERATE (*)    Bacteria, UA FEW (*)    All other components within normal limits  CULTURE, BLOOD (ROUTINE X 2)  CULTURE, BLOOD (ROUTINE X 2)  URINE CULTURE  LACTIC ACID, PLASMA  PROTIME-INR  LACTIC ACID, PLASMA    EKG: EKG Interpretation Date/Time:  Friday May 05 2024 04:49:48 EDT Ventricular Rate:  112 PR Interval:  161 QRS Duration:  81 QT Interval:  298 QTC Calculation: 407 R Axis:   25  Text Interpretation: Sinus tachycardia Probable left atrial enlargement Nonspecific T abnormalities, lateral leads When compared with ECG of 08/11/2022, HEART RATE has decreased Confirmed by Raford Lenis ([id number]) on 05/05/2024 4:56:37 AM  Radiology: [facility] Chest Port 1 View Result Date: 05/05/2024 CLINICAL DATA:  Questionable sepsis.  Check for pneumonia. EXAM: PORTABLE CHEST 1 VIEW COMPARISON:  PA Lat chest 08/11/2022 FINDINGS: The heart size and mediastinal contours are within normal limits. The tips of the CP sulci were excluded from the exam. There is no substantial pleural effusion. There is linear scarring or atelectasis in the lateral left base. The lungs are otherwise clear. There is thoracic spondylosis. Overlying telemetry leads. IMPRESSION: Linear scarring or atelectasis in the lateral left base. No evidence of acute chest process. Electronically Signed   By: Francis Quam M.D.   On: 05/05/2024 05:04     Procedures  Cardiac versus sinus tachycardia, per my interpretation. Medications Ordered in the ED - No data to display                                  Medical Decision Making Amount and/or Complexity of Data Reviewed Labs: ordered. Radiology: ordered.  Risk Decision regarding hospitalization.   Dysuria and symptoms suggestive of infection with chills and borderline elevated temperature concerning for sepsis.  I have reviewed his past records, and note nephrostomy tube initially  placed on 05/26/2022.  I have ordered the evolving sepsis pathway.  Chest x-ray shows no acute cardiopulmonary process.  Have independently viewed the image, and agree with the radiologist's interpretation.  I have reviewed his electrocardiogram, my interpretation is sinus tachycardia with left atrial enlargement and nonspecific T wave abnormality-similar to prior except heart rate is actually decreased.  I have reviewed his laboratory tests, and my interpretation is normal lactic acid level, elevated random glucose level which will need to be followed as an outpatient, mild leukocytosis with left shift, normal hemoglobin and platelet count, urinary tract infection with positive nitrite, greater than 50 WBCs, WBC clumps.  He is still tachycardic following IV fluids.  Considering persistent tachycardia and presence of nephrostomy tube making this a complicated infection, I  feel he will need to be treated as an inpatient initially.  I have ordered intravenous ceftriaxone .  I have discussed case with Dr. Bryn of [facility], who agrees to admit the patient.  CRITICAL CARE Performed by: Alm Lias Total critical care time: 45 minutes Critical care time was exclusive of separately billable procedures and treating other patients. Critical care was necessary to treat or prevent imminent or life-threatening deterioration. Critical care was time spent personally by me on the following activities: development of treatment plan with patient and/or surrogate as well as nursing, discussions with consultants, evaluation of patient's response to treatment, examination of patient, obtaining history from patient or surrogate, ordering and performing treatments and interventions, ordering and review of laboratory studies, ordering and review of radiographic studies, pulse oximetry and re-evaluation of patient's condition.     Final diagnoses:  Urinary tract infection without hematuria, site unspecified   Elevated random blood glucose level    ED Discharge Orders     None          Lias Alm, MD 05/05/24 [phone]

## 2024-05-05 NOTE — ED Notes (Signed)
 Patient transported to CT

## 2024-05-05 NOTE — ED Triage Notes (Addendum)
 Pov from home. Cc of painful urination for a couple weeks but worse lately. Also said that he is having lower back pain. Patient of Dr Sherrilee

## 2024-05-05 NOTE — Progress Notes (Signed)
 PROGRESS NOTE    Patient: Christian Kennedy                            PCP: Garald [name], MD                    DOB: 1966/01/05            DOA: 05/05/2024 FMW:[id number]             DOS: 05/05/2024, 11:11 AM   LOS: 0 days   Date of Service: The patient was seen and examined on 05/05/2024  Subjective:   The patient was seen and examined this morning. Awake alert, Tmax 103.2, pulse 125, RR 22, blood pressure 120/71 Denies of having any chest pain or shortness of breath  Brief Narrative:   Christian Kennedy is a 58 y.o. male with medical history significant of urothelial carcinoma s/p TURBT 2019 and 2023 and s/p right percutaneous nephrostomy tube Aug 2023, tobacco use, GERD who presented to the ED for worsening dysuria and urinary frequency. He reports about a week of intermittent but worsening severity now severe burning with urination and going about 10 times per night. No hematuria or pyuria. Felt some chills, low grade fever at home. Started feeling my kidney with right flank discomfort so presented to the ED last night. In the ED he was tachycardic with leukocytosis and urinalysis suggestive of UTI. Admission was requested when tachycardia persisted despite IV fluids and patient's symptoms were unimproved.    SCr 1.04, glucose 112, TBili 1.3 with no other LFT elevations or abnormalities on CMP. Lactate 1.0. WBC 13.9k (PMN 11.5k). Urinalysis: >50 WBCs w/clumps, 21-50 RBCs. Few bacteria.        Assessment & Plan:   Principal Problem:   Sepsis (HCC) Active Problems:   Urothelial cancer (HCC)   Complicated UTI (urinary tract infection)   GERD (gastroesophageal reflux disease)   Smoker   Dysuria   Bladder tumor   Malignant neoplasm of lateral wall of urinary bladder (HCC)    Assessment and Plan:  Sepsis -with complicated UTI:  - Patient is now meeting sepsis criteria with Tmax of 103.2, pulse of 125, RR of 22, BP 120/71 -likely source urinary tract infection,  complicated by chronic right nephrostomy Initiating sepsis protocol, aggressive IV fluid resuscitation,  POA: WBC 13.9, lactic acid 1.0  CT abdomen pelvis reviewed with urologist Dr. Selma IMPRESSION: 1. Mass-like hyperdensity along the posterior and right lateral wall of the urinary bladder, possibly representing a mass and/or clot. 2. Stable right pigtail nephrostomy catheter in position with mild right renal parenchymal atrophy as before.   - Continue ceftriaxone  2g IV q24h pending urine and blood culture data.  - Give IVF for persistent tachycardia. Lactate reassuring.  - Given his hardware in situ, check CT to r/o abscess.    Urothelial carcinoma: ?  Bladder cancer s/p TURBT x2 and PCN 2023 s/p last exchange by IR 04/11/2024.   - Tx is surveillance with Dr. Rogers currently, also follows with Dr. Sherrilee. PCN appears to be functioning well.    GERD:  - PPI   Tobacco use: Precontemplative for additional attempt, as this and fishing are his only stress relievers. ~1ppd currently, been smoking for 50 years he says.  - Nicotine  patch declined, will order prn - Cessation counseling provided. He's been counseled extensively by Dr. Garald as well.    Hyperglycemia:  Pt reports fasting for religious  reasons. Glucose random in ED is therefore elevated fasting level.  Given his BMI 30.62,  will check HbA1c.    ---------------------------------------------------------------------------------------------------------------- Nutritional status:  The patient's BMI is: Body mass index is 30.62 kg/m. I agree with the assessment and plan as outlined Dr. Gershon and has gone already you cover-uncover -------------------------------------------------------------------------------------------------------------------------------- Cultures; Blood Cultures x 2 >> Urine Culture  >>>      ------------------------------------------------------------------------------------------------------------------------------------------------  DVT prophylaxis:  SCDs   Code Status:   Code Status: Full Code  Family Communication: No family member present at bedside-  -Advance care planning has been discussed.   Admission status:   Status is: Observation The patient remains OBS appropriate and will d/c before 2 midnights.   Disposition: From  - home             Planning for discharge in 1-2 days   Procedures:   No admission procedures for hospital encounter.   Antimicrobials:  Anti-infectives (From admission, onward)    Start     Dose/Rate Route Frequency Ordered Stop   05/06/24 0600  cefTRIAXone  (ROCEPHIN ) 2 g in sodium chloride  0.9 % 100 mL IVPB  Status:  Discontinued        2 g 200 mL/hr over 30 Minutes Intravenous Every 24 hours 05/05/24 0804 05/05/24 1106   05/05/24 1200  ceFEPIme  (MAXIPIME ) 2 g in sodium chloride  0.9 % 100 mL IVPB        2 g 200 mL/hr over 30 Minutes Intravenous  Once 05/05/24 1106     05/05/24 0600  cefTRIAXone  (ROCEPHIN ) 2 g in sodium chloride  0.9 % 100 mL IVPB        2 g 200 mL/hr over 30 Minutes Intravenous  Once 05/05/24 0548 05/05/24 0624        Medication:   sodium chloride  flush  3 mL Intravenous Q12H    acetaminophen  **OR** acetaminophen , nicotine , ondansetron  **OR** ondansetron  (ZOFRAN ) IV   Objective:   Vitals:   05/05/24 0700 05/05/24 0817 05/05/24 0917 05/05/24 1051  BP: 132/79 135/73 127/70 120/71  Pulse: (!) 112 (!) 116 (!) 119 (!) 125  Resp: (!) 29 (!) 29 (!) 29 (!) 22  Temp:  99.2 F (37.3 C) (!) 103.2 F (39.6 C) 98.5 F (36.9 C)  TempSrc:  Oral Oral Oral  SpO2: 95% 100% 95% 96%  Weight:      Height:        Intake/Output Summary (Last 24 hours) at 05/05/2024 1111 Last data filed at 05/05/2024 9375 Gross per 24 hour  Intake 100 ml  Output --  Net 100 ml   Filed Weights   05/05/24 0430  Weight:  111.1 kg     Physical examination:   General:  AAO x 3,  cooperative, no distress;   HEENT:  Normocephalic, PERRL, otherwise with in Normal limits   Neuro:  CNII-XII intact. , normal motor and sensation, reflexes intact   Lungs:   Clear to auscultation BL, Respirations unlabored,  No wheezes / crackles  Cardio:    S1/S2, RRR, No murmure, No Rubs or Gallops   Abdomen:  Right-sided urostomy in place,  cloudy urine noted in the bag Soft, non-tender, bowel sounds active all four quadrants, no guarding or peritoneal signs.  Muscular  skeletal:  Limited exam -global generalized weaknesses - in bed, able to move all 4 extremities,   2+ pulses,  symmetric, No pitting edema  Skin:  Dry, warm to touch, negative for any Rashes,  Wounds: Please see nursing documentation       ------------------------------------------------------------------------------------------------------------------------------------------  LABs:     Latest Ref Rng & Units 05/05/2024    5:10 AM 03/28/2024    8:44 AM 12/29/2023    3:52 PM  CBC  WBC 4.0 - 10.5 K/uL 13.9  10.4  8.2   Hemoglobin 13.0 - 17.0 g/dL 84.1  83.7  85.5   Hematocrit 39.0 - 52.0 % 46.6  48.8  42.0   Platelets 150 - 400 K/uL 183  181  213.0       Latest Ref Rng & Units 05/05/2024    5:10 AM 03/28/2024    8:44 AM 12/29/2023    3:52 PM  CMP  Glucose 70 - 99 mg/dL 887  874  896   BUN 6 - 20 mg/dL 14  19  24    Creatinine 0.61 - 1.24 mg/dL 8.95  8.96  8.66   Sodium 135 - 145 mmol/L 136  137  138   Potassium 3.5 - 5.1 mmol/L 3.8  4.1  4.3   Chloride 98 - 111 mmol/L 98  102  104   CO2 22 - 32 mmol/L 25  25  28    Calcium 8.9 - 10.3 mg/dL 9.2  9.3  9.1   Total Protein 6.5 - 8.1 g/dL 7.6  7.0  6.9   Total Bilirubin 0.0 - 1.2 mg/dL 1.3  0.8  0.4   Alkaline Phos 38 - 126 U/L 55  52  55   AST 15 - 41 U/L 14  16  13    ALT 0 - 44 U/L 14  15  13         Micro Results Recent Results (from the past 240 hours)  Blood Culture (routine x 2)      Status: None (Preliminary result)   Collection Time: 05/05/24  5:10 AM   Specimen: BLOOD  Result Value Ref Range Status   Specimen Description BLOOD BLOOD RIGHT ARM  Final   Special Requests   Final    BOTTLES DRAWN AEROBIC AND ANAEROBIC Blood Culture adequate volume   Culture   Final    NO GROWTH < 12 HOURS Performed at [facility], [address], [place], [place] [address]    Report Status PENDING  Incomplete  Blood Culture (routine x 2)     Status: None (Preliminary result)   Collection Time: 05/05/24  5:42 AM   Specimen: BLOOD  Result Value Ref Range Status   Specimen Description BLOOD BLOOD LEFT ARM  Final   Special Requests   Final    BOTTLES DRAWN AEROBIC AND ANAEROBIC Blood Culture adequate volume   Culture   Final    NO GROWTH < 12 HOURS Performed at [facility], [address], [place], [place] [address]    Report Status PENDING  Incomplete    Radiology Reports CT ABDOMEN PELVIS W CONTRAST Result Date: 05/05/2024 EXAM: CT ABDOMEN AND PELVIS WITH CONTRAST 05/05/2024 07:34:48 AM TECHNIQUE: CT of the abdomen and pelvis was performed with the administration of intravenous contrast. Multiplanar reformatted images are provided for review. Automated exposure control, iterative reconstruction, and/or weight based adjustment of the mA/kV was utilized to reduce the radiation dose to as low as reasonably achievable. COMPARISON: 08/19/2023 CLINICAL HISTORY: Sepsis. Bladder pain possible UTI, chronic right nephrostomy. FINDINGS: LOWER CHEST: Minimal dependent atelectasis in the lung bases. LIVER: The liver is unremarkable. GALLBLADDER AND BILE DUCTS: Gallbladder is unremarkable. No biliary ductal dilatation. SPLEEN: No acute abnormality. PANCREAS: No acute abnormality. ADRENAL GLANDS: No acute abnormality. KIDNEYS, URETERS AND BLADDER: Right pigtail nephrostomy catheter  stable in position. Mild right renal parenchymal atrophy as before. Mass-like hyperdensity along the posterior and right  lateral wall of the urinary bladder may represent a mass or clot. No stones in the kidneys or ureters. No hydronephrosis. No perinephric or periureteral stranding. GI AND BOWEL: Stomach demonstrates no acute abnormality. There is no bowel obstruction. No bowel wall thickening. PERITONEUM AND RETROPERITONEUM: No ascites. No free air. VASCULATURE: Scattered coronary calcifications. Mild scattered aortoiliac calcified plaque without AAA. LYMPH NODES: No lymphadenopathy. REPRODUCTIVE ORGANS: No acute abnormality. BONES AND SOFT TISSUES: Mild bilateral hip degenerative joint disease. No acute osseous abnormality. No focal soft tissue abnormality. IMPRESSION: 1. Mass-like hyperdensity along the posterior and right lateral wall of the urinary bladder, possibly representing a mass and/or clot. 2. Stable right pigtail nephrostomy catheter in position with mild right renal parenchymal atrophy as before. Electronically signed by: Katheleen Faes MD 05/05/2024 07:41 AM EDT RP Workstation: [id number]   DG Chest Port 1 View Result Date: 05/05/2024 CLINICAL DATA:  Questionable sepsis.  Check for pneumonia. EXAM: PORTABLE CHEST 1 VIEW COMPARISON:  PA Lat chest 08/11/2022 FINDINGS: The heart size and mediastinal contours are within normal limits. The tips of the CP sulci were excluded from the exam. There is no substantial pleural effusion. There is linear scarring or atelectasis in the lateral left base. The lungs are otherwise clear. There is thoracic spondylosis. Overlying telemetry leads. IMPRESSION: Linear scarring or atelectasis in the lateral left base. No evidence of acute chest process. Electronically Signed   By: Francis Quam M.D.   On: 05/05/2024 05:04    SIGNED: Adriana DELENA Grams, MD, FHM. FAAFP. Jolynn Pack - [facility] Time spent - 55 min.  In seeing, evaluating and examining the patient. Reviewing medical records, labs, drawn plan of care. [facility],  Pager (please use [email or web address] to page/  text) Please use Epic Secure Chat for non-urgent communication (7AM-7PM)  If 7PM-7AM, please contact night-coverage [email or web address], 05/05/2024, 11:11 AM

## 2024-05-05 NOTE — Sepsis Progress Note (Signed)
 Sepsis protocol monitored by eLink ?

## 2024-05-05 NOTE — H&P (Signed)
 History and Physical    Patient: Christian Kennedy FMW:[id number] DOB: 10-Jan-1966 DOA: 05/05/2024 DOS: the patient was seen and examined on 05/05/2024 PCP: Garald [name], MD  Patient coming from: Home  Chief Complaint:  Chief Complaint  Patient presents with   Dysuria   HPI: Christian Kennedy is a 58 y.o. male with medical history significant of urothelial carcinoma s/p TURBT 2019 and 2023 and s/p right percutaneous nephrostomy tube Aug 2023, tobacco use, GERD who presented to the ED for worsening dysuria and urinary frequency. He reports about a week of intermittent but worsening severity now severe burning with urination and going about 10 times per night. No hematuria or pyuria. Felt some chills, low grade fever at home. Started feeling my kidney with right flank discomfort so presented to the ED last night. In the ED he was tachycardic with leukocytosis and urinalysis suggestive of UTI. Admission was requested when tachycardia persisted despite IV fluids and patient's symptoms were unimproved.   Review of Systems: As mentioned in the history of present illness. All other systems reviewed and are negative. Past Medical History:  Diagnosis Date   Abscess    Right buttocks   Bladder tumor    Depression    Son passed 2014   Ear infection    Eating disorder    Frequent headaches    GERD (gastroesophageal reflux disease)    Helicobacter pylori ab+ 10/27/2017   Hepatitis    childhood, jaundice   Migraines    Urinary incontinence    Varicose vein of leg    Right leg   Vitamin D  deficiency    Past Surgical History:  Procedure Laterality Date   CYSTOSCOPY W/ RETROGRADES Bilateral 11/12/2022   Procedure: CYSTOSCOPY WITH RETROGRADE PYELOGRAM;  Surgeon: Sherrilee Belvie CROME, MD;  Location: [facility];  Service: Urology;  Laterality: Bilateral;   CYSTOSCOPY W/ URETERAL STENT PLACEMENT Right 11/19/2017   Procedure: CYSTOSCOPY WITH RETROGRADE PYELOGRAM/URETERAL STENT PLACEMENT;  Surgeon:  Devere Lonni Righter, MD;  Location: [facility];  Service: Urology;  Laterality: Right;   CYSTOSCOPY WITH FULGERATION N/A 11/19/2017   Procedure: CYSTOSCOPY WITH FULGERATION AND CLOT EVACUATION;  Surgeon: Chauncey Redell Agent, MD;  Location: [facility];  Service: Urology;  Laterality: N/A;   IR NEPHROSTOMY EXCHANGE RIGHT  07/20/2022   IR NEPHROSTOMY EXCHANGE RIGHT  10/06/2022   IR NEPHROSTOMY EXCHANGE RIGHT  12/01/2022   IR NEPHROSTOMY EXCHANGE RIGHT  02/02/2023   IR NEPHROSTOMY EXCHANGE RIGHT  04/06/2023   IR NEPHROSTOMY EXCHANGE RIGHT  04/20/2023   IR NEPHROSTOMY EXCHANGE RIGHT  06/15/2023   IR NEPHROSTOMY EXCHANGE RIGHT  08/17/2023   IR NEPHROSTOMY EXCHANGE RIGHT  09/17/2023   IR NEPHROSTOMY EXCHANGE RIGHT  11/08/2023   IR NEPHROSTOMY EXCHANGE RIGHT  12/21/2023   IR NEPHROSTOMY EXCHANGE RIGHT  02/15/2024   IR NEPHROSTOMY EXCHANGE RIGHT  04/11/2024   IR NEPHROSTOMY PLACEMENT RIGHT  05/26/2022   TRANSURETHRAL RESECTION OF BLADDER TUMOR N/A 11/19/2017   Procedure: TRANSURETHRAL RESECTION OF BLADDER TUMOR (TURBT);  Surgeon: Devere Lonni Righter, MD;  Location: [facility];  Service: Urology;  Laterality: N/A;   TRANSURETHRAL RESECTION OF BLADDER TUMOR N/A 05/18/2022   Procedure: TRANSURETHRAL RESECTION OF BLADDER TUMOR (TURBT);  Surgeon: Sherrilee Belvie CROME, MD;  Location: [facility];  Service: Urology;  Laterality: N/A;   TRANSURETHRAL RESECTION OF BLADDER TUMOR N/A 11/12/2022   Procedure: TRANSURETHRAL RESECTION OF BLADDER TUMOR (TURBT);  Surgeon: Sherrilee Belvie CROME, MD;  Location: [facility];  Service: Urology;  Laterality: N/A;   Social History:  reports that he has been smoking cigarettes. He has a 30 pack-year smoking history. He has never used smokeless tobacco. He reports that he does not drink alcohol and does not use drugs.  Allergies  Allergen Reactions   Macrobid  [Nitrofurantoin ]     ?epig abd pain    Family History  Problem Relation Age of Onset   Cancer Mother     Depression Mother    Arthritis Father    Depression Father    Hearing loss Father    Depression Daughter    Early death Son     Prior to Admission medications   Medication Sig Start Date End Date Taking? Authorizing Provider  amoxicillin -clavulanate (AUGMENTIN ) 875-125 MG tablet Take 1 tablet by mouth 2 (two) times daily. 12/27/23   Plotnikov, Aleksei V, MD  b complex vitamins capsule Take 1 capsule by mouth daily.    [provider]  BLACK CURRANT SEED OIL PO Take 1 capsule by mouth daily.    [provider]  chlorhexidine  (HIBICLENS ) 4 % external liquid Apply topically daily as needed (use for skin cleaning). 04/13/23   Plotnikov, Aleksei V, MD  Cholecalciferol (VITAMIN D3) 50 MCG (2000 UT) CAPS Take 1 capsule (2,000 Units total) by mouth daily. 11/17/22   Plotnikov, [name], MD  diazepam  (VALIUM ) 5 MG tablet Take 1 tablet (5 mg total) by mouth 2 (two) times daily as needed (insomnia or anxiety). 04/13/23   Plotnikov, Aleksei V, MD  mupirocin  ointment (BACTROBAN ) 2 % On leg wound w/dressing change qd if seeing pus, if infected 04/13/23   Plotnikov, [name], MD  RABEprazole  (ACIPHEX ) 20 MG tablet Take 1 tablet (20 mg total) by mouth daily. 12/27/23   Plotnikov, Aleksei V, MD  sodium chloride  flush (NS) 0.9 % SOLN 10 ml syr use as directed 07/07/22   Plotnikov, Aleksei V, MD  traMADol  (ULTRAM ) 50 MG tablet Take 1 tablet (50 mg total) by mouth every 8 (eight) hours as needed for moderate pain (pain score 4-6). 12/27/23   Plotnikov, [name], MD    Physical Exam: Vitals:   05/05/24 0430 05/05/24 0432 05/05/24 0513  BP:  118/86 134/77  Pulse:  (!) 114 (!) 112  Resp:  17 20  Temp:  98.8 F (37.1 C)   SpO2:  98% 97%  Weight: 111.1 kg    Height: 6' 3 (1.905 m)    Gen: No distress, though mildly diaphoretic Pulm: Clear, nonlabored  CV: Regular tachycardia, no MRG, no edema or JVD GI: Soft, NT, ND, +BS. Very minimal right flank tenderness to percussion. Right PCN site  c/d/i and draining clear urine.  Neuro: Alert and oriented. No new focal deficits. Ext: Warm, no deformities. Skin: No rashes, lesions or ulcers on visualized skin   Data Reviewed: SCr 1.04, glucose 112, TBili 1.3 with no other LFT elevations or abnormalities on CMP. Lactate 1.0. WBC 13.9k (PMN 11.5k). Urinalysis: >50 WBCs w/clumps, 21-50 RBCs. Few bacteria.    Assessment and Plan: Complicated UTI:  - Continue ceftriaxone  2g IV q24h pending urine and blood culture data.  - Give IVF for persistent tachycardia. Lactate reassuring.  - Given his hardware in situ, check CT to r/o abscess.   Urothelial carcinoma: s/p TURBT x2 and PCN 2023 s/p last exchange by IR 04/11/2024.   - Tx is surveillance with Dr. Rogers currently, also follows with Dr. Sherrilee. PCN appears to be functioning well.   GERD:  - PPI  Tobacco use:  Precontemplative for additional attempt, as this and fishing are his only stress relievers. ~1ppd currently, been smoking for 50 years he says.  - Nicotine  patch declined, will order prn - Cessation counseling provided. He's been counseled extensively by Dr. Garald as well.   Hyperglycemia: Pt reports fasting for religious reasons. Glucose random in ED is therefore elevated fasting level. Given his BMI 30.62, will check HbA1c.    Advance Care Planning: Full code  Consults: None  Family Communication: None  Severity of Illness: The appropriate patient status for this patient is OBSERVATION. Observation status is judged to be reasonable and necessary in order to provide the required intensity of service to ensure the patient's safety. The patient's presenting symptoms, physical exam findings, and initial radiographic and laboratory data in the context of their medical condition is felt to place them at decreased risk for further clinical deterioration. Furthermore, it is anticipated that the patient will be medically stable for discharge from the hospital within 2  midnights of admission.   Author: Bernardino KATHEE Come, MD 05/05/2024 6:46 AM  For on call review [email or web address].

## 2024-05-05 NOTE — ED Notes (Signed)
 Patient transported back from CT

## 2024-05-05 NOTE — Plan of Care (Signed)
  Problem: Education: Goal: Knowledge of General Education information will improve Description: Including pain rating scale, medication(s)/side effects and non-pharmacologic comfort measures Outcome: Progressing   Problem: Clinical Measurements: Goal: Respiratory complications will improve Outcome: Progressing   Problem: Activity: Goal: Risk for activity intolerance will decrease Outcome: Progressing   Problem: Pain Managment: Goal: General experience of comfort will improve and/or be controlled Outcome: Progressing

## 2024-05-06 DIAGNOSIS — A409 Streptococcal sepsis, unspecified: Secondary | ICD-10-CM | POA: Diagnosis not present

## 2024-05-06 LAB — CBC
HCT: 43.6 % (ref 39.0–52.0)
Hemoglobin: 14.2 g/dL (ref 13.0–17.0)
MCH: 30 pg (ref 26.0–34.0)
MCHC: 32.6 g/dL (ref 30.0–36.0)
MCV: 92.2 fL (ref 80.0–100.0)
Platelets: 164 K/uL (ref 150–400)
RBC: 4.73 MIL/uL (ref 4.22–5.81)
RDW: 13.5 % (ref 11.5–15.5)
WBC: 12.3 K/uL — ABNORMAL HIGH (ref 4.0–10.5)
nRBC: 0 % (ref 0.0–0.2)

## 2024-05-06 LAB — BASIC METABOLIC PANEL WITH GFR
Anion gap: 11 (ref 5–15)
BUN: 12 mg/dL (ref 6–20)
CO2: 27 mmol/L (ref 22–32)
Calcium: 8.7 mg/dL — ABNORMAL LOW (ref 8.9–10.3)
Chloride: 102 mmol/L (ref 98–111)
Creatinine, Ser: 0.93 mg/dL (ref 0.61–1.24)
GFR, Estimated: >60 mL/min (ref >60)
Glucose, Bld: 143 mg/dL — ABNORMAL HIGH (ref 70–99)
Potassium: 4 mmol/L (ref 3.5–5.1)
Sodium: 140 mmol/L (ref 135–145)

## 2024-05-06 LAB — HEMOGLOBIN A1C
Hgb A1c MFr Bld: 5.4 % (ref 4.8–5.6)
Mean Plasma Glucose: 108 mg/dL

## 2024-05-06 NOTE — Plan of Care (Signed)
  Problem: Education: Goal: Knowledge of General Education information will improve Description: Including pain rating scale, medication(s)/side effects and non-pharmacologic comfort measures Outcome: Progressing   Problem: Clinical Measurements: Goal: Will remain free from infection Outcome: Progressing Goal: Diagnostic test results will improve Outcome: Progressing   Problem: Clinical Measurements: Goal: Signs and symptoms of infection will decrease Outcome: Progressing

## 2024-05-06 NOTE — Progress Notes (Signed)
 PROGRESS NOTE    Patient: Christian Kennedy                            PCP: Plotnikov, Aleksei V, MD                    DOB: 09/08/1966            DOA: 05/05/2024 FMW:[id number]             DOS: 05/06/2024, 12:31 PM   LOS: 1 day   Date of Service: The patient was seen and examined on 05/06/2024  Subjective:   The patient was seen and examined this morning, feeling much better. In no acute distress, hemodynamically stable Tmax 103.2, this morning 98.9  S/p aggressive IV fluid resuscitation on IV antibiotics  Brief Narrative:   Christian Kennedy is a 58 y.o. male with medical history significant of urothelial carcinoma s/p TURBT 2019 and 2023 and s/p right percutaneous nephrostomy tube Aug 2023, tobacco use, GERD who presented to the ED for worsening dysuria and urinary frequency. He reports about a week of intermittent but worsening severity now severe burning with urination and going about 10 times per night. No hematuria or pyuria. Felt some chills, low grade fever at home. Started feeling my kidney with right flank discomfort so presented to the ED last night. In the ED he was tachycardic with leukocytosis and urinalysis suggestive of UTI. Admission was requested when tachycardia persisted despite IV fluids and patient's symptoms were unimproved.    SCr 1.04, glucose 112, TBili 1.3 with no other LFT elevations or abnormalities on CMP. Lactate 1.0. WBC 13.9k (PMN 11.5k). Urinalysis: >50 WBCs w/clumps, 21-50 RBCs. Few bacteria.        Assessment & Plan:   Principal Problem:   Sepsis (HCC) Active Problems:   Urothelial cancer (HCC)   Complicated UTI (urinary tract infection)   GERD (gastroesophageal reflux disease)   Smoker   Dysuria   Bladder tumor   Malignant neoplasm of lateral wall of urinary bladder (HCC)    Assessment and Plan:  Sepsis -with complicated UTI:  -Improved sepsis physiology - on 05/05/2024 patient met sepsis criteria with Tmax of 103.2, pulse of 125, RR of  22, -blood pressure remained stable POA: WBC 13.9>> 15.2 >> 12.3 , lactic acid 1.0  -likely source urinary tract infection, complicated by chronic right nephrostomy -Urine culture growing E. Coli-pending sensitivity - Blood in urostomy urine culture pending - S/p IV fluid resuscitation, continue current antibiotics of cefepime    CT abdomen pelvis reviewed with urologist Dr. Selma IMPRESSION: 1. Mass-like hyperdensity along the posterior and right lateral wall of the urinary bladder, possibly representing a mass and/or clot. 2. Stable right pigtail nephrostomy catheter in position with mild right renal parenchymal atrophy as before.  -Urologist was consulted, case was discussed in detail with Dr. Selma Who recommended no surgical invention, continue current management   Urothelial carcinoma: ?  Bladder cancer s/p TURBT x2 and PCN 2023 s/p last exchange by IR 04/11/2024.   - Tx is surveillance with Dr. Rogers currently, also follows with Dr. Sherrilee. PCN appears to be functioning well.    GERD:  - PPI   Tobacco use: Precontemplative for additional attempt, as this and fishing are his only stress relievers. ~1ppd currently, been smoking for 50 years he says.  - Nicotine  patch declined, will order prn - Cessation counseling provided. He's been counseled extensively by Dr. Garald  as well.    Hyperglycemia:  Pt reports fasting for religious reasons. Glucose random in ED is therefore elevated fasting level.  Given his BMI 30.62, HbA1c.  5.4   ---------------------------------------------------------------------------------------------------------------- Nutritional status:  The patient's BMI is: Body mass index is 31.24 kg/m. I agree with the assessment and plan as outlined  ------------------------------------------------------------------------------------------------------------------ Cultures; Blood Cultures x 2 >> no growth to date Urine Culture  >>> growing E.  coli    ------------------------------------------------------------------------------------------------------------------------------------------------  DVT prophylaxis:  SCDs   Code Status:   Code Status: Full Code  Family Communication: No family member present at bedside-  -Advance care planning has been discussed.   Admission status:   Status is: Observation The patient remains OBS appropriate and will d/c before 2 midnights.   Disposition: From  - home             Planning for discharge in 1 days   Procedures:   No admission procedures for hospital encounter.   Antimicrobials:  Anti-infectives (From admission, onward)    Start     Dose/Rate Route Frequency Ordered Stop   05/06/24 0600  cefTRIAXone  (ROCEPHIN ) 2 g in sodium chloride  0.9 % 100 mL IVPB  Status:  Discontinued        2 g 200 mL/hr over 30 Minutes Intravenous Every 24 hours 05/05/24 0804 05/05/24 1106   05/05/24 1200  ceFEPIme  (MAXIPIME ) 2 g in sodium chloride  0.9 % 100 mL IVPB  Status:  Discontinued        2 g 200 mL/hr over 30 Minutes Intravenous  Once 05/05/24 1106 05/05/24 1113   05/05/24 1200  ceFEPIme  (MAXIPIME ) 2 g in sodium chloride  0.9 % 100 mL IVPB        2 g 200 mL/hr over 30 Minutes Intravenous Every 8 hours 05/05/24 1113     05/05/24 0600  cefTRIAXone  (ROCEPHIN ) 2 g in sodium chloride  0.9 % 100 mL IVPB        2 g 200 mL/hr over 30 Minutes Intravenous  Once 05/05/24 0548 05/05/24 0624        Medication:   sodium chloride  flush  3 mL Intravenous Q12H    acetaminophen  **OR** acetaminophen , nicotine , ondansetron  **OR** ondansetron  (ZOFRAN ) IV   Objective:   Vitals:   05/05/24 1800 05/05/24 2118 05/06/24 0059 05/06/24 0424  BP:  113/64 108/67 114/71  Pulse:  86 89 85  Resp:    (!) 22  Temp: 98.2 F (36.8 C) 98.9 F (37.2 C) 98.5 F (36.9 C) 98.9 F (37.2 C)  TempSrc: Oral Oral Oral Oral  SpO2:  97% 96% 97%  Weight:      Height:        Intake/Output Summary (Last 24  hours) at 05/06/2024 1231 Last data filed at 05/06/2024 0700 Gross per 24 hour  Intake 2254.87 ml  Output --  Net 2254.87 ml   Filed Weights   05/05/24 0430 05/05/24 0917  Weight: 111.1 kg 113.4 kg     Physical examination:    General:  AAO x 3,  cooperative, no distress;   HEENT:  Normocephalic, PERRL, otherwise with in Normal limits   Neuro:  CNII-XII intact. , normal motor and sensation, reflexes intact   Lungs:   Clear to auscultation BL, Respirations unlabored,  No wheezes / crackles  Cardio:    S1/S2, RRR, No murmure, No Rubs or Gallops   Abdomen:  Right-sided urostomy Soft, non-tender, bowel sounds active all four quadrants, no guarding or peritoneal signs.  Muscular  skeletal:  Limited exam -global generalized weaknesses - in bed, able to move all 4 extremities,   2+ pulses,  symmetric, No pitting edema  Skin:  Dry, warm to touch, negative for any Rashes,  Wounds: Please see nursing documentation    ------------------------------------------------------------------------------------------------------------------    LABs:     Latest Ref Rng & Units 05/06/2024    4:04 AM 05/05/2024   11:23 AM 05/05/2024    5:10 AM  CBC  WBC 4.0 - 10.5 K/uL 12.3  15.2  13.9   Hemoglobin 13.0 - 17.0 g/dL 85.7  85.6  84.1   Hematocrit 39.0 - 52.0 % 43.6  42.7  46.6   Platelets 150 - 400 K/uL 164  164  183       Latest Ref Rng & Units 05/06/2024    4:04 AM 05/05/2024   11:23 AM 05/05/2024    5:10 AM  CMP  Glucose 70 - 99 mg/dL 856  881  887   BUN 6 - 20 mg/dL 12  13  14    Creatinine 0.61 - 1.24 mg/dL 9.06  8.98  8.95   Sodium 135 - 145 mmol/L 140  135  136   Potassium 3.5 - 5.1 mmol/L 4.0  3.5  3.8   Chloride 98 - 111 mmol/L 102  100  98   CO2 22 - 32 mmol/L 27  22  25    Calcium 8.9 - 10.3 mg/dL 8.7  8.7  9.2   Total Protein 6.5 - 8.1 g/dL  6.7  7.6   Total Bilirubin 0.0 - 1.2 mg/dL  1.5  1.3   Alkaline Phos 38 - 126 U/L  49  55   AST 15 - 41 U/L  13  14   ALT 0 - 44 U/L  13  14         Micro Results Recent Results (from the past 240 hours)  Urine Culture     Status: Abnormal (Preliminary result)   Collection Time: 05/05/24  4:50 AM   Specimen: Urine, Random  Result Value Ref Range Status   Specimen Description   Final    URINE, RANDOM Performed at [facility], [address], [place], [place] [address]    Special Requests   Final    NONE Reflexed from [id number] Performed at [facility], [address], [place], [place] [address]    Culture >=100,000 COLONIES/mL ESCHERICHIA COLI (A)  Final   Report Status PENDING  Incomplete  Blood Culture (routine x 2)     Status: None (Preliminary result)   Collection Time: 05/05/24  5:10 AM   Specimen: BLOOD  Result Value Ref Range Status   Specimen Description BLOOD BLOOD RIGHT ARM  Final   Special Requests   Final    BOTTLES DRAWN AEROBIC AND ANAEROBIC Blood Culture adequate volume   Culture   Final    NO GROWTH 1 DAY Performed at [facility], [address], [place], [place] [address]    Report Status PENDING  Incomplete  Blood Culture (routine x 2)     Status: None (Preliminary result)   Collection Time: 05/05/24  5:42 AM   Specimen: BLOOD  Result Value Ref Range Status   Specimen Description BLOOD BLOOD LEFT ARM  Final   Special Requests   Final    BOTTLES DRAWN AEROBIC AND ANAEROBIC Blood Culture adequate volume   Culture   Final    NO GROWTH 1 DAY Performed at [facility], [address] [place], [place] [address]  Report Status PENDING  Incomplete    Radiology Reports No results found.   SIGNED: Adriana DELENA Grams, MD, FHM. FAAFP. [facility]ol[facility] - [facility] Time spent - 55 min.  In seeing, evaluating and examining the patient. Reviewing medical records, labs, drawn plan of care. [facility],  Pager (please use [email or web address] to page/ text) Please use Epic Secure Chat for non-urgent communication (7AM-7PM)  If 7PM-7AM, please contact night-coverage [email or web address], 05/06/2024, 12:31  PM

## 2024-05-07 DIAGNOSIS — A4151 Sepsis due to Escherichia coli [E. coli]: Secondary | ICD-10-CM | POA: Diagnosis not present

## 2024-05-07 LAB — BASIC METABOLIC PANEL WITH GFR
Anion gap: 8 (ref 5–15)
BUN: 12 mg/dL (ref 6–20)
CO2: 26 mmol/L (ref 22–32)
Calcium: 8.4 mg/dL — ABNORMAL LOW (ref 8.9–10.3)
Chloride: 101 mmol/L (ref 98–111)
Creatinine, Ser: 1.04 mg/dL (ref 0.61–1.24)
GFR, Estimated: >60 mL/min (ref >60)
Glucose, Bld: 97 mg/dL (ref 70–99)
Potassium: 3.8 mmol/L (ref 3.5–5.1)
Sodium: 135 mmol/L (ref 135–145)

## 2024-05-07 LAB — CBC
HCT: 42.2 % (ref 39.0–52.0)
Hemoglobin: 14.1 g/dL (ref 13.0–17.0)
MCH: 30.5 pg (ref 26.0–34.0)
MCHC: 33.4 g/dL (ref 30.0–36.0)
MCV: 91.3 fL (ref 80.0–100.0)
Platelets: 154 K/uL (ref 150–400)
RBC: 4.62 MIL/uL (ref 4.22–5.81)
RDW: 13.3 % (ref 11.5–15.5)
WBC: 6.9 K/uL (ref 4.0–10.5)
nRBC: 0 % (ref 0.0–0.2)

## 2024-05-07 MED ORDER — LACTINEX PO CHEW: 1.0000 | CHEWABLE_TABLET | Freq: Three times a day (TID) | ORAL | 0 refills | Status: AC

## 2024-05-07 MED ORDER — CIPROFLOXACIN HCL 500 MG PO TABS: 500.0000 mg | ORAL_TABLET | Freq: Two times a day (BID) | ORAL | 0 refills | Status: DC

## 2024-05-07 MED ORDER — CIPROFLOXACIN HCL 250 MG PO TABS
500.0000 mg | ORAL_TABLET | Freq: Two times a day (BID) | ORAL | Status: DC
  Filled 2024-05-07: qty 2

## 2024-05-07 NOTE — Plan of Care (Signed)
  Problem: Clinical Measurements: Goal: Will remain free from infection Outcome: Progressing Goal: Diagnostic test results will improve Outcome: Progressing   Problem: Coping: Goal: Level of anxiety will decrease Outcome: Progressing   Problem: Fluid Volume: Goal: Hemodynamic stability will improve Outcome: Progressing   Problem: Clinical Measurements: Goal: Diagnostic test results will improve Outcome: Progressing Goal: Signs and symptoms of infection will decrease Outcome: Progressing

## 2024-05-07 NOTE — Progress Notes (Signed)
 Patient given discharge instructions and follow up appointments , verbalized understanding of follow up appointments , IV removed from RFA, patient picked up by wife at main entrance,.

## 2024-05-07 NOTE — Discharge Summary (Signed)
 Physician Discharge Summary   Patient: Christian Kennedy MRN: [id number] DOB: 12/22/1965  Admit date:     05/05/2024  Discharge date: 05/07/24  Discharge Physician: Adriana DELENA Grams   PCP: Garald [name], MD   Recommendations at discharge:   Follow-up with urologist Dr. Sherrilee in 1 week Continue prolonged course of antibiotics (ciprofloxacin ) Follow-up with the final sensitivity on urine culture E. coli  Discharge Diagnoses: Principal Problem:   Sepsis (HCC) Active Problems:   Urothelial cancer (HCC)   Complicated UTI (urinary tract infection)   GERD (gastroesophageal reflux disease)   Smoker   Dysuria   Bladder tumor   Malignant neoplasm of lateral wall of urinary bladder (HCC)  -Sepsis due to UTI  -Muscle invasive bladder cancer managed with palliative care - Chronic right ureteral obstruction managed with right nephrostomy tube    Sepsis -with complicated UTI:  -Improved sepsis physiology - on 05/05/2024 patient met sepsis criteria with Tmax of 103.2, pulse of 125, RR of 22, -blood pressure remained stable POA: WBC 13.9>> 15.2 >> 12.3 , lactic acid 1.0   -likely source urinary tract infection, complicated by chronic right nephrostomy -Urine culture growing E. Coli-pending sensitivity - Blood in urostomy urine culture pending - S/p IV fluid resuscitation,  Was on IV antibiotics of Cefepime  ---> switch to p.o. Ciprofloxacin      CT abdomen pelvis reviewed with urologist Dr. Selma IMPRESSION: 1. Mass-like hyperdensity along the posterior and right lateral wall of the urinary bladder, possibly representing a mass and/or clot. 2. Stable right pigtail nephrostomy catheter in position with mild right renal parenchymal atrophy as before.   -Urologist was consulted, case was discussed in detail with Dr. Selma Who recommended no surgical invention, continue current management Recommended total of 2 weeks of antibiotic coverage    Urothelial carcinoma: ?  Bladder  cancer s/p TURBT x2 and PCN 2023 s/p last exchange by IR 04/11/2024.   - Tx is surveillance with Dr. Rogers currently, also follows with Dr. Sherrilee. PCN appears to be functioning well.    GERD:  - PPI   Tobacco use: Precontemplative for additional attempt, as this and fishing are his only stress relievers. ~1ppd currently, been smoking for 50 years he says.  - Nicotine  patch declined, will order prn - Cessation counseling provided. He's been counseled extensively by Dr. Garald as well.    Hyperglycemia:  Pt reports fasting for religious reasons. Glucose random in ED is therefore elevated fasting level.  Given his BMI 30.62, HbA1c.  5.4   ---------------------------------------------------------------------------------------------------------------- Nutritional status:  The patient's BMI is: Body mass index is 31.24 kg/m. I agree with the assessment and plan as outlined  ------------------------------------------------------------------------------------------------------------------ Cultures; Blood Cultures x 2 >> no growth to date Urine Culture  >>> growing E. coli   Consultants: Urologist Dr. JINNY Procedures performed: None Disposition: Home Diet recommendation:  Discharge Diet Orders (From admission, onward)     Start     Ordered   05/07/24 0000  Diet - low sodium heart healthy        05/07/24 1055           Regular diet DISCHARGE MEDICATION: Allergies as of 05/07/2024       Reactions   Macrobid  [nitrofurantoin ]    ?epig abd pain        Medication List     TAKE these medications    b complex vitamins capsule Take 1 capsule by mouth daily.   BLACK CURRANT SEED OIL PO Take 1 capsule by mouth  daily.   ciprofloxacin  500 MG tablet Commonly known as: CIPRO  Take 1 tablet (500 mg total) by mouth 2 (two) times daily for 12 days.   Goodys Extra Strength 520-260-32.5 MG Pack Generic drug: Aspirin-Acetaminophen -Caffeine Take 1-2 Packages by mouth every  6 (six) hours as needed (Pain).   lactobacillus acidophilus & bulgar chewable tablet Chew 1 tablet by mouth 3 (three) times daily with meals for 10 days.   Vitamin D3 50 MCG (2000 UT) capsule Take 1 capsule (2,000 Units total) by mouth daily.        Discharge Exam: Filed Weights   05/05/24 0430 05/05/24 0917  Weight: 111.1 kg 113.4 kg        General:  AAO x 3,  cooperative, no distress;   HEENT:  Normocephalic, PERRL, otherwise with in Normal limits   Neuro:  CNII-XII intact. , normal motor and sensation, reflexes intact   Lungs:   Clear to auscultation BL, Respirations unlabored,  No wheezes / crackles  Cardio:    S1/S2, RRR, No murmure, No Rubs or Gallops   Abdomen:  Soft, non-tender, bowel sounds active all four quadrants, no guarding or peritoneal signs.  Muscular  skeletal:  Limited exam -global generalized weaknesses - in bed, able to move all 4 extremities,   2+ pulses,  symmetric, No pitting edema  Skin:  Dry, warm to touch, negative for any Rashes,  Wounds: Please see nursing documentation          Condition at discharge: good  The results of significant diagnostics from this hospitalization (including imaging, microbiology, ancillary and laboratory) are listed below for reference.   Imaging Studies: CT ABDOMEN PELVIS W CONTRAST Result Date: 05/05/2024 EXAM: CT ABDOMEN AND PELVIS WITH CONTRAST 05/05/2024 07:34:48 AM TECHNIQUE: CT of the abdomen and pelvis was performed with the administration of intravenous contrast. Multiplanar reformatted images are provided for review. Automated exposure control, iterative reconstruction, and/or weight based adjustment of the mA/kV was utilized to reduce the radiation dose to as low as reasonably achievable. COMPARISON: 08/19/2023 CLINICAL HISTORY: Sepsis. Bladder pain possible UTI, chronic right nephrostomy. FINDINGS: LOWER CHEST: Minimal dependent atelectasis in the lung bases. LIVER: The liver is unremarkable. GALLBLADDER  AND BILE DUCTS: Gallbladder is unremarkable. No biliary ductal dilatation. SPLEEN: No acute abnormality. PANCREAS: No acute abnormality. ADRENAL GLANDS: No acute abnormality. KIDNEYS, URETERS AND BLADDER: Right pigtail nephrostomy catheter stable in position. Mild right renal parenchymal atrophy as before. Mass-like hyperdensity along the posterior and right lateral wall of the urinary bladder may represent a mass or clot. No stones in the kidneys or ureters. No hydronephrosis. No perinephric or periureteral stranding. GI AND BOWEL: Stomach demonstrates no acute abnormality. There is no bowel obstruction. No bowel wall thickening. PERITONEUM AND RETROPERITONEUM: No ascites. No free air. VASCULATURE: Scattered coronary calcifications. Mild scattered aortoiliac calcified plaque without AAA. LYMPH NODES: No lymphadenopathy. REPRODUCTIVE ORGANS: No acute abnormality. BONES AND SOFT TISSUES: Mild bilateral hip degenerative joint disease. No acute osseous abnormality. No focal soft tissue abnormality. IMPRESSION: 1. Mass-like hyperdensity along the posterior and right lateral wall of the urinary bladder, possibly representing a mass and/or clot. 2. Stable right pigtail nephrostomy catheter in position with mild right renal parenchymal atrophy as before. Electronically signed by: Katheleen Faes MD 05/05/2024 07:41 AM EDT RP Workstation: [id number]   DG Chest Port 1 View Result Date: 05/05/2024 CLINICAL DATA:  Questionable sepsis.  Check for pneumonia. EXAM: PORTABLE CHEST 1 VIEW COMPARISON:  PA Lat chest 08/11/2022 FINDINGS: The heart size and mediastinal contours  are within normal limits. The tips of the CP sulci were excluded from the exam. There is no substantial pleural effusion. There is linear scarring or atelectasis in the lateral left base. The lungs are otherwise clear. There is thoracic spondylosis. Overlying telemetry leads. IMPRESSION: Linear scarring or atelectasis in the lateral left base. No evidence of  acute chest process. Electronically Signed   By: Francis Quam M.D.   On: 05/05/2024 05:04   IR NEPHROSTOMY EXCHANGE RIGHT Result Date: 04/11/2024 INDICATION: Routine exchange. EXAM: RIGHT NEPHROSTOMY CATHETER EXCHANGE COMPARISON:  IR fluoroscopy, most recently 02/15/2024 CONTRAST:  20 mL Isovue -300 administered into the collecting system FLUOROSCOPY: Radiation Exposure Index and estimated peak skin dose (PSD); Reference air kerma (RAK), 6 mGy. COMPLICATIONS: None immediate. TECHNIQUE: Informed written consent was obtained from the patient after a discussion of the risks, benefits and alternatives to treatment. Questions regarding the procedure were encouraged and answered. A timeout was performed prior to the initiation of the procedure. The RIGHT flank and external portion of existing nephrostomy catheter were prepped and draped in the usual sterile fashion. A sterile drape was applied covering the operative field. Maximum barrier sterile technique with sterile gowns and gloves were used for the procedure. A timeout was performed prior to the initiation of the procedure. A pre procedural spot fluoroscopic image was obtained after contrast was injected via the existing nephrostomy catheter demonstrating appropriate positioning within the renal pelvis. The existing nephrostomy catheter was cut and cannulated with a Benson wire which was coiled within the renal pelvis. Under intermittent fluoroscopic guidance, the existing nephrostomy catheter was exchanged for a new 10 Fr drainage catheter. Contrast injection confirmed appropriate positioning within the renal pelvis and a post exchange fluoroscopic image was obtained. The catheter was locked and secured to the skin with a suture. A dressing was placed. The patient tolerated the procedure well without immediate postprocedural complication. FINDINGS: The existing nephrostomy catheter is appropriately positioned and functioning. After successful fluoroscopic guided  exchange, the new nephrostomy catheter is coiled and locked within the RIGHT renal pelvis. IMPRESSION: 1. Antegrade nephrostogram with persistent RIGHT distal ureteral obstruction, at the UPJ. 2. Successful fluoroscopic guided exchange of a RIGHT 10 Fr percutaneous nephrostomy catheter. RECOMMENDATIONS: Patient will return to [facility] ([facility]) for routine drainage catheter evaluation and exchange in 8 weeks. Thom Hall, MD [facility] Radiology Electronically Signed   By: Thom Hall M.D.   On: 04/11/2024 15:12    Microbiology: Results for orders placed or performed during the hospital encounter of 05/05/24  Urine Culture     Status: Abnormal (Preliminary result)   Collection Time: 05/05/24  4:50 AM   Specimen: Urine, Random  Result Value Ref Range Status   Specimen Description   Final    URINE, RANDOM Performed at [facility], [address], [place], [place] [address]    Special Requests   Final    NONE Reflexed from [id number] Performed at [facility], [address], [place], [place] [address]    Culture (A)  Final    >=100,000 COLONIES/mL ESCHERICHIA COLI CONFIRMATION OF SUSCEPTIBILITIES IN PROGRESS CULTURE REINCUBATED FOR BETTER GROWTH Performed at [facility], [address] [place], [place] [address]    Report Status PENDING  Incomplete  Blood Culture (routine x 2)     Status: None (Preliminary result)   Collection Time: 05/05/24  5:10 AM   Specimen: BLOOD  Result Value Ref Range Status   Specimen Description BLOOD BLOOD RIGHT  ARM  Final   Special Requests   Final    BOTTLES DRAWN AEROBIC AND ANAEROBIC Blood Culture adequate volume   Culture   Final    NO GROWTH 2 DAYS Performed at [facility], [address], [place], [place] [address]    Report Status PENDING  Incomplete  Blood Culture (routine x 2)     Status: None (Preliminary result)   Collection Time: 05/05/24  5:42 AM   Specimen: BLOOD   Result Value Ref Range Status   Specimen Description BLOOD BLOOD LEFT ARM  Final   Special Requests   Final    BOTTLES DRAWN AEROBIC AND ANAEROBIC Blood Culture adequate volume   Culture   Final    NO GROWTH 2 DAYS Performed at [facility], [address], [place], [place] [address]    Report Status PENDING  Incomplete    Labs: CBC: Recent Labs  Lab 05/05/24 0510 05/05/24 1123 05/06/24 0404 05/07/24 0430  WBC 13.9* 15.2* 12.3* 6.9  NEUTROABS 11.5* 12.7*  --   --   HGB 15.8 14.3 14.2 14.1  HCT 46.6 42.7 43.6 42.2  MCV 91.0 90.7 92.2 91.3  PLT 183 164 164 154   Basic Metabolic Panel: Recent Labs  Lab 05/05/24 0510 05/05/24 1123 05/06/24 0404 05/07/24 0430  NA 136 135 140 135  K 3.8 3.5 4.0 3.8  CL 98 100 102 101  CO2 25 22 27 26   GLUCOSE 112* 118* 143* 97  BUN 14 13 12 12   CREATININE 1.04 1.01 0.93 1.04  CALCIUM 9.2 8.7* 8.7* 8.4*   Liver Function Tests: Recent Labs  Lab 05/05/24 0510 05/05/24 1123  AST 14* 13*  ALT 14 13  ALKPHOS 55 49  BILITOT 1.3* 1.5*  PROT 7.6 6.7  ALBUMIN 4.2 3.6   CBG: No results for input(s): GLUCAP in the last 168 hours.  Discharge time spent: greater than 30 minutes.  Signed: Adriana DELENA Grams, MD [facility] 05/07/2024

## 2024-05-07 NOTE — Plan of Care (Signed)
   Problem: Education: Goal: Knowledge of General Education information will improve Description Including pain rating scale, medication(s)/side effects and non-pharmacologic comfort measures Outcome: Progressing   Problem: Health Behavior/Discharge Planning: Goal: Ability to manage health-related needs will improve Outcome: Progressing

## 2024-05-08 ENCOUNTER — Telehealth: Payer: Self-pay | Admitting: *Deleted

## 2024-05-08 ENCOUNTER — Telehealth: Payer: Self-pay

## 2024-05-08 NOTE — Transitions of Care (Post Inpatient/ED Visit) (Signed)
   05/08/2024  Name: ARLES RUMBOLD MRN: [id number] DOB: [birth year]  Today's TOC FU Call Status: Today's TOC FU Call Status:: Unsuccessful Call (1st Attempt) Unsuccessful Call (1st Attempt) Date: 05/08/24  Attempted to reach the patient regarding the most recent Inpatient visit.  Left HIPAA compliant voice message  Follow Up Plan: Additional outreach attempts will be made to reach the patient to complete the Transitions of Care (Post Inpatient/ED visit) call.   Pls call/ message for questions,  Lacretia Tindall Mckinney Kip Kautzman, RN, BSN, CCRN Alumnus RN Care Manager  Transitions of Care  [facility] - [facility] [phone]: direct office

## 2024-05-08 NOTE — Telephone Encounter (Signed)
 Copied from CRM #[id number]. Topic: General - Other >> May 08, 2024  2:22 PM Franky GRADE wrote: Reason for CRM:  Willette Adriana LABOR, MD is calling to speak with Dr.Plotnikov regarding Christian Kennedy who is currently admitted in [facility]. Best call back number would be his cell phone [phone]

## 2024-05-09 ENCOUNTER — Telehealth: Payer: Self-pay | Admitting: *Deleted

## 2024-05-09 ENCOUNTER — Encounter: Payer: Self-pay | Admitting: Internal Medicine

## 2024-05-09 ENCOUNTER — Ambulatory Visit (INDEPENDENT_AMBULATORY_CARE_PROVIDER_SITE_OTHER): Admitting: Internal Medicine

## 2024-05-09 VITALS — BP 129/81 | HR 97 | Temp 97.9°F | Ht 75.0 in | Wt 251.0 lb

## 2024-05-09 DIAGNOSIS — K2100 Gastro-esophageal reflux disease with esophagitis, without bleeding: Secondary | ICD-10-CM

## 2024-05-09 DIAGNOSIS — N390 Urinary tract infection, site not specified: Secondary | ICD-10-CM

## 2024-05-09 DIAGNOSIS — N3000 Acute cystitis without hematuria: Secondary | ICD-10-CM

## 2024-05-09 DIAGNOSIS — R1030 Lower abdominal pain, unspecified: Secondary | ICD-10-CM

## 2024-05-09 DIAGNOSIS — C672 Malignant neoplasm of lateral wall of bladder: Secondary | ICD-10-CM

## 2024-05-09 LAB — URINE CULTURE: Culture: >=100000 — AB

## 2024-05-09 MED ORDER — RABEPRAZOLE SODIUM 20 MG PO TBEC: 20.0000 mg | DELAYED_RELEASE_TABLET | Freq: Two times a day (BID) | ORAL | 3 refills | Status: DC

## 2024-05-09 MED ORDER — TRAMADOL HCL 50 MG PO TABS: 50.0000 mg | ORAL_TABLET | Freq: Three times a day (TID) | ORAL | 3 refills | Status: DC | PRN

## 2024-05-09 NOTE — Progress Notes (Addendum)
 Patient's recent urine cultures with intermediate resistance to cipro .  New prescription for Bactrim  called into [facility].  Patient informed stop cipro  and start taking new Bactrim  prescription.

## 2024-05-09 NOTE — Transitions of Care (Post Inpatient/ED Visit) (Signed)
   05/09/2024  Name: Christian Kennedy MRN: [id number] DOB: 1966/07/25  Today's TOC FU Call Status: Today's TOC FU Call Status:: Unsuccessful Call (2nd Attempt) Unsuccessful Call (2nd Attempt) Date: 05/09/24  Attempted to reach the patient regarding the most recent Inpatient visit.  Left HIPAA compliant voice message   Follow Up Plan: Additional outreach attempts will be made to reach the patient to complete the Transitions of Care (Post Inpatient/ED visit) call.   Pls call/ message for questions,  Mazin Emma Mckinney Anniah Glick, RN, BSN, CCRN Alumnus RN Care Manager  Transitions of Care  [facility] - [facility] [phone]: direct office

## 2024-05-09 NOTE — Assessment & Plan Note (Signed)
 Abd CT reviewed F/u w/Dr McKenzie  Bactrim  DS x 10 d D/c Cipro 

## 2024-05-09 NOTE — Assessment & Plan Note (Addendum)
 Abd CT reviewed F/u w/Dr McKenzie Pain is likely due to cancer, surgical manipulations, urostomy Tramadol  as needed  Potential benefits of a long term opioids use as well as potential risks (i.e. addiction risk, apnea etc) and complications (i.e. Somnolence, constipation and others) were explained to the patient and were aknowledged.

## 2024-05-09 NOTE — Progress Notes (Addendum)
 Subjective:  Patient ID: Christian Kennedy, male    DOB: 1966/04/10  Age: 58 y.o. MRN: [id number]  CC: Hospitalization Follow-up   HPI Christian Kennedy presents for recent UTI/sepsis C/o GERD, lower abdominal pain, burning on urination  Per hx:  Expand All Collapse All  Thank you    Physician Discharge Summary    Patient: Christian Kennedy MRN: [id number] DOB: [birth year]  Admit date:     05/05/2024  Discharge date: 05/07/24  Discharge Physician: Adriana DELENA Grams    PCP: Garald [name], MD    Recommendations at discharge:    Follow-up with urologist Dr. Sherrilee in 1 week Continue prolonged course of antibiotics (ciprofloxacin ) Follow-up with the final sensitivity on urine culture E. coli   Discharge Diagnoses: Principal Problem:   Sepsis (HCC) Active Problems:   Urothelial cancer (HCC)   Complicated UTI (urinary tract infection)   GERD (gastroesophageal reflux disease)   Smoker   Dysuria   Bladder tumor   Malignant neoplasm of lateral wall of urinary bladder (HCC)   -Sepsis due to UTI  -Muscle invasive bladder cancer managed with palliative care - Chronic right ureteral obstruction managed with right nephrostomy tube       Sepsis -with complicated UTI:  -Improved sepsis physiology - on 05/05/2024 patient met sepsis criteria with Tmax of 103.2, pulse of 125, RR of 22, -blood pressure remained stable POA: WBC 13.9>> 15.2 >> 12.3 , lactic acid 1.0   -likely source urinary tract infection, complicated by chronic right nephrostomy -Urine culture growing E. Coli-pending sensitivity - Blood in urostomy urine culture pending - S/p IV fluid resuscitation,  Was on IV antibiotics of Cefepime  ---> switch to p.o. Ciprofloxacin      CT abdomen pelvis reviewed with urologist Dr. Selma IMPRESSION: 1. Mass-like hyperdensity along the posterior and right lateral wall of the urinary bladder, possibly representing a mass and/or clot. 2. Stable right pigtail nephrostomy  catheter in position with mild right renal parenchymal atrophy as before.   -Urologist was consulted, case was discussed in detail with Dr. Selma Who recommended no surgical invention, continue current management Recommended total of 2 weeks of antibiotic coverage     Urothelial carcinoma: ?  Bladder cancer s/p TURBT x2 and PCN 2023 s/p last exchange by IR 04/11/2024.   - Tx is surveillance with Dr. Rogers currently, also follows with Dr. Sherrilee. PCN appears to be functioning well.    GERD:  - PPI   Tobacco use: Precontemplative for additional attempt, as this and fishing are his only stress relievers. ~1ppd currently, been smoking for 50 years he says.  - Nicotine  patch declined, will order prn - Cessation counseling provided. He's been counseled extensively by Dr. Garald as well.    Hyperglycemia:  Pt reports fasting for religious reasons. Glucose random in ED is therefore elevated fasting level.  Given his BMI 30.62, HbA1c.  5.4   ---------------------------------------------------------------------------------------------------------------- Nutritional status:  The patient's BMI is: Body mass index is 31.24 kg/m. I agree with the assessment and plan as outlined  ------------------------------------------------------------------------------------------------------------------ Cultures; Blood Cultures x 2 >> no growth to date Urine Culture  >>> growing E. coli     UTI E coli - intermediate sensitivity to Cipro    Outpatient Medications Prior to Visit  Medication Sig Dispense Refill   Aspirin-Acetaminophen -Caffeine (GOODYS EXTRA STRENGTH) 520-260-32.5 MG PACK Take 1-2 Packages by mouth every 6 (six) hours as needed (Pain).     b complex vitamins capsule Take 1 capsule by mouth daily.  BLACK CURRANT SEED OIL PO Take 1 capsule by mouth daily.     Cholecalciferol (VITAMIN D3) 50 MCG (2000 UT) CAPS Take 1 capsule (2,000 Units total) by mouth daily. 100 capsule 3    lactobacillus acidophilus & bulgar (LACTINEX) chewable tablet Chew 1 tablet by mouth 3 (three) times daily with meals for 10 days. 30 tablet 0   ciprofloxacin  (CIPRO ) 500 MG tablet Take 1 tablet (500 mg total) by mouth 2 (two) times daily for 12 days. 24 tablet 0   sulfamethoxazole -trimethoprim  (BACTRIM  DS) 800-160 MG tablet Take 1 tablet by mouth 2 (two) times daily.     No facility-administered medications prior to visit.    ROS: Review of Systems  Constitutional:  Negative for appetite change, fatigue and unexpected weight change.  HENT:  Negative for congestion, nosebleeds, sneezing, sore throat and trouble swallowing.   Eyes:  Negative for itching and visual disturbance.  Respiratory:  Negative for cough.   Cardiovascular:  Negative for chest pain, palpitations and leg swelling.  Gastrointestinal:  Positive for abdominal pain. Negative for abdominal distention, blood in stool, diarrhea and nausea.  Genitourinary:  Positive for frequency and urgency. Negative for hematuria.  Musculoskeletal:  Positive for back pain. Negative for arthralgias, gait problem, joint swelling and neck pain.  Skin:  Negative for rash.  Neurological:  Negative for dizziness, tremors, speech difficulty and weakness.  Psychiatric/Behavioral:  Negative for agitation, dysphoric mood, sleep disturbance and suicidal ideas. The patient is not nervous/anxious.     Objective:  BP 129/81   Pulse 97   Temp 97.9 F (36.6 C) (Oral)   Ht 6' 3 (1.905 m)   Wt 251 lb (113.9 kg)   SpO2 99%   BMI 31.37 kg/m   BP Readings from Last 3 Encounters:  07/21/24 126/72  07/14/24 134/76  06/28/24 126/74    Wt Readings from Last 3 Encounters:  07/21/24 260 lb (117.9 kg)  07/14/24 255 lb 1.2 oz (115.7 kg)  05/23/24 255 lb (115.7 kg)    Physical Exam Constitutional:      General: He is not in acute distress.    Appearance: He is well-developed.     Comments: NAD  Eyes:     Conjunctiva/sclera: Conjunctivae normal.      Pupils: Pupils are equal, round, and reactive to light.  Neck:     Thyroid : No thyromegaly.     Vascular: No JVD.  Cardiovascular:     Rate and Rhythm: Normal rate and regular rhythm.     Heart sounds: Normal heart sounds. No murmur heard.    No friction rub. No gallop.  Pulmonary:     Effort: Pulmonary effort is normal. No respiratory distress.     Breath sounds: Normal breath sounds. No wheezing or rales.  Chest:     Chest wall: No tenderness.  Abdominal:     General: Bowel sounds are normal. There is no distension.     Palpations: Abdomen is soft. There is no mass.     Tenderness: There is no abdominal tenderness. There is no guarding or rebound.  Musculoskeletal:        General: No tenderness. Normal range of motion.     Cervical back: Normal range of motion.  Lymphadenopathy:     Cervical: No cervical adenopathy.  Skin:    General: Skin is warm and dry.     Findings: No rash.  Neurological:     Mental Status: He is alert and oriented to person, place, and time.  Cranial Nerves: No cranial nerve deficit.     Motor: No abnormal muscle tone.     Coordination: Coordination normal.     Gait: Gait normal.     Deep Tendon Reflexes: Reflexes are normal and symmetric.  Psychiatric:        Behavior: Behavior normal.        Thought Content: Thought content normal.        Judgment: Judgment normal.   Urostomy  Lab Results  Component Value Date   WBC 10.2 07/14/2024   HGB 14.2 07/14/2024   HCT 42.2 07/14/2024   PLT 195 07/14/2024   GLUCOSE 102 (H) 07/14/2024   CHOL 214 (H) 12/29/2023   TRIG 258.0 (H) 12/29/2023   HDL 43.10 12/29/2023   LDLDIRECT 132.0 10/08/2021   LDLCALC 119 (H) 12/29/2023   ALT 13 07/14/2024   AST 14 (L) 07/14/2024   NA 138 07/14/2024   K 4.0 07/14/2024   CL 104 07/14/2024   CREATININE 1.05 07/14/2024   BUN 26 (H) 07/14/2024   CO2 24 07/14/2024   TSH 0.83 12/29/2023   PSA 1.18 10/08/2021   INR 1.1 05/05/2024   HGBA1C 5.4 05/05/2024     CT ABDOMEN PELVIS W CONTRAST Result Date: 05/05/2024 EXAM: CT ABDOMEN AND PELVIS WITH CONTRAST 05/05/2024 07:34:48 AM TECHNIQUE: CT of the abdomen and pelvis was performed with the administration of intravenous contrast. Multiplanar reformatted images are provided for review. Automated exposure control, iterative reconstruction, and/or weight based adjustment of the mA/kV was utilized to reduce the radiation dose to as low as reasonably achievable. COMPARISON: 08/19/2023 CLINICAL HISTORY: Sepsis. Bladder pain possible UTI, chronic right nephrostomy. FINDINGS: LOWER CHEST: Minimal dependent atelectasis in the lung bases. LIVER: The liver is unremarkable. GALLBLADDER AND BILE DUCTS: Gallbladder is unremarkable. No biliary ductal dilatation. SPLEEN: No acute abnormality. PANCREAS: No acute abnormality. ADRENAL GLANDS: No acute abnormality. KIDNEYS, URETERS AND BLADDER: Right pigtail nephrostomy catheter stable in position. Mild right renal parenchymal atrophy as before. Mass-like hyperdensity along the posterior and right lateral wall of the urinary bladder may represent a mass or clot. No stones in the kidneys or ureters. No hydronephrosis. No perinephric or periureteral stranding. GI AND BOWEL: Stomach demonstrates no acute abnormality. There is no bowel obstruction. No bowel wall thickening. PERITONEUM AND RETROPERITONEUM: No ascites. No free air. VASCULATURE: Scattered coronary calcifications. Mild scattered aortoiliac calcified plaque without AAA. LYMPH NODES: No lymphadenopathy. REPRODUCTIVE ORGANS: No acute abnormality. BONES AND SOFT TISSUES: Mild bilateral hip degenerative joint disease. No acute osseous abnormality. No focal soft tissue abnormality. IMPRESSION: 1. Mass-like hyperdensity along the posterior and right lateral wall of the urinary bladder, possibly representing a mass and/or clot. 2. Stable right pigtail nephrostomy catheter in position with mild right renal parenchymal atrophy as before.  Electronically signed by: Katheleen Faes MD 05/05/2024 07:41 AM EDT RP Workstation: [id number]   DG Chest Port 1 View Result Date: 05/05/2024 CLINICAL DATA:  Questionable sepsis.  Check for pneumonia. EXAM: PORTABLE CHEST 1 VIEW COMPARISON:  PA Lat chest 08/11/2022 FINDINGS: The heart size and mediastinal contours are within normal limits. The tips of the CP sulci were excluded from the exam. There is no substantial pleural effusion. There is linear scarring or atelectasis in the lateral left base. The lungs are otherwise clear. There is thoracic spondylosis. Overlying telemetry leads. IMPRESSION: Linear scarring or atelectasis in the lateral left base. No evidence of acute chest process. Electronically Signed   By: Francis Quam M.D.   On: 05/05/2024 05:04  Assessment & Plan:   Problem List Items Addressed This Visit     Abdominal pain, lower   Pain is likely due to cancer, surgical manipulations, urostomy; overactive painful bladder Tramadol  as needed  Potential benefits of a long term opioids use as well as potential risks (i.e. addiction risk, apnea etc) and complications (i.e. Somnolence, constipation and others) were explained to the patient and were aknowledged.       Complicated UTI (urinary tract infection)   Abd CT reviewed F/u w/Dr Sherrilee  Bactrim  DS x 10 d D/c Cipro       GERD (gastroesophageal reflux disease)   Worse.  Increase Aciphex  to 20 mg twice daily      Malignant neoplasm of lateral wall of urinary bladder (HCC)   Abd CT reviewed F/u w/Dr McKenzie Pain is likely due to cancer, surgical manipulations, urostomy Tramadol  as needed  Potential benefits of a long term opioids use as well as potential risks (i.e. addiction risk, apnea etc) and complications (i.e. Somnolence, constipation and others) were explained to the patient and were aknowledged.      UTI (urinary tract infection) - Primary    AMPICILLIN >=32 RESIST... Resistant     AMPICILLIN/SULBACTAM  >=32 RESIST... Resistant    CEFAZOLIN   Resistant 1    CEFEPIME  <=0.12 SENS... Sensitive    CEFTRIAXONE  0.5 SENSITIVE Sensitive    CIPROFLOXACIN  0.5 INTERME... Intermediate    GENTAMICIN <=1 SENSITIVE Sensitive    MEROPENEM <=0.25 SENS... Sensitive    NITROFURANTOIN  <=16 SENSIT... Sensitive    PIP/TAZO  Resistant 2    TRIMETH /SULFA  <=20 SENSIT... Sensitive         Stop Cipro  Replaced w/Bactrim  DS         Meds ordered this encounter  Medications   traMADol  (ULTRAM ) 50 MG tablet    Sig: Take 1 tablet (50 mg total) by mouth every 8 (eight) hours as needed.    Dispense:  90 tablet    Refill:  3   DISCONTD: RABEprazole  (ACIPHEX ) 20 MG tablet    Sig: Take 1 tablet (20 mg total) by mouth 2 (two) times daily.    Dispense:  180 tablet    Refill:  3     Virtual Visit via Video Note  I connected with Christian Kennedy on 07/29/24 at  2:20 PM EDT by a video enabled telemedicine application and verified that I am speaking with the correct person using two identifiers.   I discussed the limitations of evaluation and management by telemedicine and the availability of in person appointments. The patient expressed understanding and agreed to proceed.  I was located at our [facility] office. The patient was at home. There was no one else present in the visit.  Chief Complaint  Patient presents with   Hospitalization Follow-up     History of Present Illness:   Review of Systems  Constitutional:  Negative for appetite change, fatigue and unexpected weight change.  HENT:  Negative for congestion, nosebleeds, sneezing, sore throat and trouble swallowing.   Eyes:  Negative for itching and visual disturbance.  Respiratory:  Negative for cough.   Cardiovascular:  Negative for chest pain, palpitations and leg swelling.  Gastrointestinal:  Positive for abdominal pain. Negative for abdominal distention, blood in stool, diarrhea and nausea.  Genitourinary:  Positive for frequency and  urgency. Negative for hematuria.  Musculoskeletal:  Positive for back pain. Negative for arthralgias, gait problem, joint swelling and neck pain.  Skin:  Negative for rash.  Neurological:  Negative for dizziness, tremors, speech difficulty and weakness.  Psychiatric/Behavioral:  Negative for agitation, dysphoric mood, sleep disturbance and suicidal ideas. The patient is not nervous/anxious.      Observations/Objective: The patient appears to be in no acute distress  Assessment and Plan:  Problem List Items Addressed This Visit     Abdominal pain, lower   Pain is likely due to cancer, surgical manipulations, urostomy; overactive painful bladder Tramadol  as needed  Potential benefits of a long term opioids use as well as potential risks (i.e. addiction risk, apnea etc) and complications (i.e. Somnolence, constipation and others) were explained to the patient and were aknowledged.       Complicated UTI (urinary tract infection)   Abd CT reviewed F/u w/Dr Sherrilee  Bactrim  DS x 10 d D/c Cipro       GERD (gastroesophageal reflux disease)   Worse.  Increase Aciphex  to 20 mg twice daily      Malignant neoplasm of lateral wall of urinary bladder (HCC)   Abd CT reviewed F/u w/Dr McKenzie Pain is likely due to cancer, surgical manipulations, urostomy Tramadol  as needed  Potential benefits of a long term opioids use as well as potential risks (i.e. addiction risk, apnea etc) and complications (i.e. Somnolence, constipation and others) were explained to the patient and were aknowledged.      UTI (urinary tract infection) - Primary    AMPICILLIN >=32 RESIST... Resistant     AMPICILLIN/SULBACTAM >=32 RESIST... Resistant    CEFAZOLIN   Resistant 1    CEFEPIME  <=0.12 SENS... Sensitive    CEFTRIAXONE  0.5 SENSITIVE Sensitive    CIPROFLOXACIN  0.5 INTERME... Intermediate    GENTAMICIN <=1 SENSITIVE Sensitive    MEROPENEM <=0.25 SENS... Sensitive    NITROFURANTOIN  <=16 SENSIT...  Sensitive    PIP/TAZO  Resistant 2    TRIMETH /SULFA  <=20 SENSIT... Sensitive         Stop Cipro  Replaced w/Bactrim  DS        Meds ordered this encounter  Medications   traMADol  (ULTRAM ) 50 MG tablet    Sig: Take 1 tablet (50 mg total) by mouth every 8 (eight) hours as needed.    Dispense:  90 tablet    Refill:  3   DISCONTD: RABEprazole  (ACIPHEX ) 20 MG tablet    Sig: Take 1 tablet (20 mg total) by mouth 2 (two) times daily.    Dispense:  180 tablet    Refill:  3     Follow Up Instructions:    I discussed the assessment and treatment plan with the patient. The patient was provided an opportunity to ask questions and all were answered. The patient agreed with the plan and demonstrated an understanding of the instructions.   The patient was advised to call back or seek an in-person evaluation if the symptoms worsen or if the condition fails to improve as anticipated.  I provided face-to-face time during this encounter. We were at different locations.   Marolyn Noel, MD  Follow-up: Return in about 4 weeks (around 06/06/2024).  Marolyn Noel, MD

## 2024-05-09 NOTE — Telephone Encounter (Signed)
 We spoke yesterday.  Thank you

## 2024-05-09 NOTE — Assessment & Plan Note (Addendum)
  AMPICILLIN >=32 RESIST... Resistant     AMPICILLIN/SULBACTAM >=32 RESIST... Resistant    CEFAZOLIN   Resistant 1    CEFEPIME  <=0.12 SENS... Sensitive    CEFTRIAXONE  0.5 SENSITIVE Sensitive    CIPROFLOXACIN  0.5 INTERME... Intermediate    GENTAMICIN <=1 SENSITIVE Sensitive    MEROPENEM <=0.25 SENS... Sensitive    NITROFURANTOIN  <=16 SENSIT... Sensitive    PIP/TAZO  Resistant 2    TRIMETH /SULFA  <=20 SENSIT... Sensitive         Stop Cipro  Replaced w/Bactrim  DS

## 2024-05-10 ENCOUNTER — Telehealth: Payer: Self-pay | Admitting: *Deleted

## 2024-05-10 ENCOUNTER — Ambulatory Visit: Payer: Self-pay | Admitting: Internal Medicine

## 2024-05-10 LAB — CULTURE, BLOOD (ROUTINE X 2)
Culture: NO GROWTH
Culture: NO GROWTH
Special Requests: ADEQUATE
Special Requests: ADEQUATE

## 2024-05-10 NOTE — Transitions of Care (Post Inpatient/ED Visit) (Signed)
   05/10/2024  Name: Christian Kennedy MRN: [id number] DOB: 1966-08-11  Today's TOC FU Call Status: Today's TOC FU Call Status:: Unsuccessful Call (3rd Attempt) Unsuccessful Call (3rd Attempt) Date: 05/10/24  Attempted to reach the patient regarding the most recent Inpatient visit.  Left HIPAA compliant voice message   Follow Up Plan: No further outreach attempts will be made at this time. We have been unable to contact the patient.  Pls call/ message for questions,  Alisen Marsiglia Mckinney Larae Caison, RN, BSN, CCRN Alumnus RN Care Manager  Transitions of Care  [facility] [phone]: direct office

## 2024-05-12 ENCOUNTER — Telehealth: Payer: Self-pay

## 2024-05-12 ENCOUNTER — Other Ambulatory Visit (HOSPITAL_COMMUNITY): Payer: Self-pay

## 2024-05-12 NOTE — Telephone Encounter (Signed)
 Pharmacy Patient Advocate Encounter  Received notification from [employer] commercial that Prior Authorization for Tramadol  50mg  tabs has been DENIED.  See denial reason below. No denial letter attached in CMM. Will attach denial letter to Media tab once received.   PA #/Case ID/Reference #: [id number]

## 2024-05-16 NOTE — Telephone Encounter (Signed)
 Why was tramadol  denied?  Thanks

## 2024-05-22 ENCOUNTER — Emergency Department (HOSPITAL_COMMUNITY)
Admission: EM | Admit: 2024-05-22 | Discharge: 2024-05-22 | Disposition: A | Discharge status: Home / Self Care | Attending: Emergency Medicine | Admitting: Emergency Medicine

## 2024-05-22 ENCOUNTER — Encounter (HOSPITAL_COMMUNITY): Payer: Self-pay

## 2024-05-22 ENCOUNTER — Other Ambulatory Visit: Payer: Self-pay

## 2024-05-22 DIAGNOSIS — N390 Urinary tract infection, site not specified: Secondary | ICD-10-CM | POA: Insufficient documentation

## 2024-05-22 DIAGNOSIS — R519 Headache, unspecified: Secondary | ICD-10-CM | POA: Insufficient documentation

## 2024-05-22 DIAGNOSIS — F17210 Nicotine dependence, cigarettes, uncomplicated: Secondary | ICD-10-CM | POA: Insufficient documentation

## 2024-05-22 DIAGNOSIS — Z8559 Personal history of malignant neoplasm of other urinary tract organ: Secondary | ICD-10-CM | POA: Insufficient documentation

## 2024-05-22 DIAGNOSIS — R109 Unspecified abdominal pain: Secondary | ICD-10-CM | POA: Diagnosis present

## 2024-05-22 LAB — URINALYSIS, W/ REFLEX TO CULTURE (INFECTION SUSPECTED)
Bacteria, UA: NONE SEEN
Bilirubin Urine: NEGATIVE
Glucose, UA: NEGATIVE mg/dL
Ketones, ur: NEGATIVE mg/dL
Nitrite: NEGATIVE
Protein, ur: 30 mg/dL — AB
Specific Gravity, Urine: 1.006 (ref 1.005–1.030)
pH: 8 (ref 5.0–8.0)

## 2024-05-22 LAB — CBC WITH DIFFERENTIAL/PLATELET
Abs Immature Granulocytes: 0.04 K/uL (ref 0.00–0.07)
Basophils Absolute: 0.1 K/uL (ref 0.0–0.1)
Basophils Relative: 1 %
Eosinophils Absolute: 0.2 K/uL (ref 0.0–0.5)
Eosinophils Relative: 3 %
HCT: 44.6 % (ref 39.0–52.0)
Hemoglobin: 15 g/dL (ref 13.0–17.0)
Immature Granulocytes: 1 %
Lymphocytes Relative: 23 %
Lymphs Abs: 1.8 K/uL (ref 0.7–4.0)
MCH: 30.8 pg (ref 26.0–34.0)
MCHC: 33.6 g/dL (ref 30.0–36.0)
MCV: 91.6 fL (ref 80.0–100.0)
Monocytes Absolute: 0.5 K/uL (ref 0.1–1.0)
Monocytes Relative: 7 %
Neutro Abs: 5.1 K/uL (ref 1.7–7.7)
Neutrophils Relative %: 65 %
Platelets: 212 K/uL (ref 150–400)
RBC: 4.87 MIL/uL (ref 4.22–5.81)
RDW: 13.6 % (ref 11.5–15.5)
WBC: 7.8 K/uL (ref 4.0–10.5)
nRBC: 0 % (ref 0.0–0.2)

## 2024-05-22 LAB — COMPREHENSIVE METABOLIC PANEL WITH GFR
ALT: 15 U/L (ref 0–44)
AST: 12 U/L — ABNORMAL LOW (ref 15–41)
Albumin: 4 g/dL (ref 3.5–5.0)
Alkaline Phosphatase: 49 U/L (ref 38–126)
Anion gap: 11 (ref 5–15)
BUN: 20 mg/dL (ref 6–20)
CO2: 29 mmol/L (ref 22–32)
Calcium: 9.4 mg/dL (ref 8.9–10.3)
Chloride: 99 mmol/L (ref 98–111)
Creatinine, Ser: 1.09 mg/dL (ref 0.61–1.24)
GFR, Estimated: >60 mL/min (ref >60)
Glucose, Bld: 105 mg/dL — ABNORMAL HIGH (ref 70–99)
Potassium: 3.8 mmol/L (ref 3.5–5.1)
Sodium: 139 mmol/L (ref 135–145)
Total Bilirubin: 0.6 mg/dL (ref 0.0–1.2)
Total Protein: 7.2 g/dL (ref 6.5–8.1)

## 2024-05-22 LAB — URINALYSIS, ROUTINE W REFLEX MICROSCOPIC
Bacteria, UA: NONE SEEN
Bilirubin Urine: NEGATIVE
Glucose, UA: NEGATIVE mg/dL
Ketones, ur: NEGATIVE mg/dL
Nitrite: NEGATIVE
Protein, ur: NEGATIVE mg/dL
Specific Gravity, Urine: 1.016 (ref 1.005–1.030)
pH: 8 (ref 5.0–8.0)

## 2024-05-22 LAB — LACTIC ACID, PLASMA: Lactic Acid, Venous: 1 mmol/L (ref 0.5–1.9)

## 2024-05-22 MED ORDER — SULFAMETHOXAZOLE-TRIMETHOPRIM 400-80 MG/5ML IV SOLN
160.0000 mg | Freq: Once | INTRAVENOUS | Status: AC
  Administered 2024-05-22: 160 mg via INTRAVENOUS
  Filled 2024-05-22: qty 10

## 2024-05-22 MED ORDER — SULFAMETHOXAZOLE-TRIMETHOPRIM 800-160 MG PO TABS: 1.0000 | ORAL_TABLET | Freq: Two times a day (BID) | ORAL | 0 refills | Status: AC

## 2024-05-22 NOTE — Assessment & Plan Note (Addendum)
 Pain is likely due to cancer, surgical manipulations, urostomy; overactive painful bladder Tramadol  as needed  Potential benefits of a long term opioids use as well as potential risks (i.e. addiction risk, apnea etc) and complications (i.e. Somnolence, constipation and others) were explained to the patient and were aknowledged.

## 2024-05-22 NOTE — Assessment & Plan Note (Addendum)
 Worse.  Increase Aciphex  to 20 mg twice daily

## 2024-05-22 NOTE — ED Provider Notes (Signed)
 Emergency Department Provider Note  TRIAGE NOTE: Pt states that he was here for a UTI last week and was given antibiotics at home and finished them and states he was better for a few days and now he is having right sided flank pain again. Also endorses headache.   HISTORY  Chief Complaint No chief complaint on file.   HPI Christian Kennedy is a 58 y.o. male with  a history of urothelial carcinoma and a right percutaneous nephrostomy tube placed in August 2023, presenting with abdominal pain and right-sided flank pain. He reports a recent urinary tract infection treated with antibiotics, which initially improved his symptoms. However, he is now experiencing burning with urination and drainage around the nephrostomy tube site, although no fever is present. The patient describes irregular bowel movements and bloating, attributing it to antibiotic use. He denies any significant tenderness upon palpation around the nephrostomy site. The nephrostomy tube was last changed three to four weeks ago, with the next change scheduled in a couple of weeks. The patient has a history of persistent tachycardia associated with UTIs and reports previous episodes of severe pain requiring medical attention. The history was obtained from the patient and his wife.  PMH Past Medical History:  Diagnosis Date   Abscess    Right buttocks   Bladder tumor    Depression    Son passed 2014   Ear infection    Eating disorder    Frequent headaches    GERD (gastroesophageal reflux disease)    Helicobacter pylori ab+ 10/27/2017   Hepatitis    childhood, jaundice   Migraines    Urinary incontinence    Varicose vein of leg    Right leg   Vitamin D  deficiency     Home Medications Prior to Admission medications   Medication Sig Start Date End Date Taking? Authorizing Provider  sulfamethoxazole -trimethoprim  (BACTRIM  DS) 800-160 MG tablet Take 1 tablet by mouth 2 (two) times daily for 10 days. 05/22/24 06/01/24 Yes  Charlene Cowdrey, Selinda, MD  Aspirin-Acetaminophen -Caffeine (GOODYS EXTRA STRENGTH) 520-260-32.5 MG PACK Take 1-2 Packages by mouth every 6 (six) hours as needed (Pain).    [provider]  b complex vitamins capsule Take 1 capsule by mouth daily.    [provider]  BLACK CURRANT SEED OIL PO Take 1 capsule by mouth daily.    [provider]  Cholecalciferol (VITAMIN D3) 50 MCG (2000 UT) CAPS Take 1 capsule (2,000 Units total) by mouth daily. 11/17/22   Plotnikov, Aleksei V, MD  RABEprazole  (ACIPHEX ) 20 MG tablet Take 1 tablet (20 mg total) by mouth 2 (two) times daily. 05/09/24   Plotnikov, Aleksei V, MD  traMADol  (ULTRAM ) 50 MG tablet Take 1 tablet (50 mg total) by mouth every 8 (eight) hours as needed. 05/09/24   Plotnikov, [name], MD    Social History Social History   Tobacco Use   Smoking status: Every Day    Current packs/day: 1.00    Average packs/day: 1 pack/day for 30.0 years (30.0 ttl pk-yrs)    Types: Cigarettes   Smokeless tobacco: Never  Vaping Use   Vaping status: Never Used  Substance Use Topics   Alcohol use: Never   Drug use: No    Review of Systems: Documented in HPI ____________________________________________  PHYSICAL EXAM: VITAL SIGNS: Triage: Blood pressure 127/79, pulse 77, temperature 98.1 F (36.7 C), temperature source Oral, resp. rate 18, SpO2 97%.  Vitals:   05/22/24 0300 05/22/24 0330 05/22/24 0430 05/22/24 0530  BP: 123/80 127/79 130/74 137/78  Pulse: 80 77 70 67  Resp: 18 18 18 18   Temp:      TempSrc:      SpO2: 98% 97% 99% 98%    Physical Exam Vitals and nursing note reviewed.  Constitutional:      Appearance: He is well-developed.  HENT:     Head: Normocephalic and atraumatic.  Cardiovascular:     Rate and Rhythm: Normal rate.  Pulmonary:     Effort: Pulmonary effort is normal. No respiratory distress.  Abdominal:     General: There is no distension.  Musculoskeletal:        General: Normal range of motion.      Cervical back: Normal range of motion.  Skin:    Comments: No obvious erythema, purulence or malodor around R nephrostomy tube. No abdominal ttp. No distension  Neurological:     General: No focal deficit present.     Mental Status: He is alert.       ____________________________________________   LABS (all labs ordered are listed, but only abnormal results are displayed)  Labs Reviewed  COMPREHENSIVE METABOLIC PANEL WITH GFR - Abnormal; Notable for the following components:      Result Value   Glucose, Bld 105 (*)    AST 12 (*)    All other components within normal limits  URINALYSIS, W/ REFLEX TO CULTURE (INFECTION SUSPECTED) - Abnormal; Notable for the following components:   Hgb urine dipstick MODERATE (*)    Protein, ur 30 (*)    Leukocytes,Ua MODERATE (*)    All other components within normal limits  URINALYSIS, ROUTINE W REFLEX MICROSCOPIC - Abnormal; Notable for the following components:   Hgb urine dipstick SMALL (*)    Leukocytes,Ua SMALL (*)    All other components within normal limits  CULTURE, BLOOD (ROUTINE X 2)  CULTURE, BLOOD (ROUTINE X 2)  CBC WITH DIFFERENTIAL/PLATELET  LACTIC ACID, PLASMA   ____________________________________________  EKG   EKG Interpretation Date/Time:    Ventricular Rate:    PR Interval:    QRS Duration:    QT Interval:    QTC Calculation:   R Axis:      Text Interpretation:          ____________________________________________  RADIOLOGY  No results found. ____________________________________________  PROCEDURES  Procedure(s) performed:   Procedures ____________________________________________  INITIAL IMPRESSION / ASSESSMENT AND PLAN   Initial Evaluation:  Patient presents with abdominal pain, recent history of UTI, and right-sided flank pain. Plan:  Obtain two urine samples, one from the bladder and one from the nephrostomy. Check bilirubin and liver function tests. Evaluate for potential GI-related  issues if abdominal pain seems related to eating. Monitor for infection around the nephrostomy tube.  Initial IIk:lmpwjmb tract infection, nephrostomy tube infection, ureteral obstruction, pyelonephritis, and possible biliary tract issues.  ED Course  Images ordered viewed and obtained by myself. Agree with Radiology interpretation. Details in ED course.  Labs ordered reviewed by myself as detailed in ED course.  Consultations obtained/considered detailed in ED course.   Clinical Course as of 05/22/24 9362  [facility] May 22, 2024  9642 Lab with difficulties. The one in the system as clean catch is actually from nephrostomy tube. [JM]    Clinical Course User Index [JM] Willey Due, Selinda, MD       FINAL IMPRESSION Final diagnoses:  Urinary tract infection with hematuria, site unspecified    Patient does appear to have a bladder infection.  His ostomy urinalysis is  reassuring.  Even if he does have a mild infection around his ostomy tube the Bactrim  should cover it.  His previous cultures were reviewed and has multiple resistances and is why Bactrim  was chosen.  Not septic, appears well.  Only symptom related was mild pain and dysuria.  Will follow-up with his urologist.  Prolonged course of antibiotics secondary to considering this complicated with his ostomy tube cleared  Disposition A medical screening exam was performed and I feel the patient has had an appropriate workup for their chief complaint at this time and likelihood of emergent condition existing is low. They have been counseled on decision, DISCHARGE, follow up and which symptoms necessitate immediate return to the emergency department. They or their family verbally stated understanding and agreement with plan and discharged in stable condition.   ____________________________________________   NEW OUTPATIENT MEDICATIONS STARTED DURING THIS VISIT:  Discharge Medication List as of 05/22/2024  5:53 AM      Note:  This note was  prepared with assistance of Dragon voice recognition software. Occasional wrong-word or sound-a-like substitutions may have occurred due to the inherent limitations of voice recognition software.    Lorette Mayo, MD 05/22/24 [phone]

## 2024-05-22 NOTE — ED Triage Notes (Signed)
 Pt states that he was here for a UTI last week and was given antibiotics at home and finished them and states he was better for a few days and now he is having right sided flank pain again. Also endorses headache.

## 2024-05-23 ENCOUNTER — Ambulatory Visit (INDEPENDENT_AMBULATORY_CARE_PROVIDER_SITE_OTHER): Admitting: Internal Medicine

## 2024-05-23 ENCOUNTER — Encounter: Payer: Self-pay | Admitting: Internal Medicine

## 2024-05-23 VITALS — BP 122/77 | HR 82 | Temp 97.6°F | Ht 75.0 in | Wt 255.0 lb

## 2024-05-23 DIAGNOSIS — K2100 Gastro-esophageal reflux disease with esophagitis, without bleeding: Secondary | ICD-10-CM

## 2024-05-23 DIAGNOSIS — D494 Neoplasm of unspecified behavior of bladder: Secondary | ICD-10-CM | POA: Diagnosis not present

## 2024-05-23 DIAGNOSIS — N390 Urinary tract infection, site not specified: Secondary | ICD-10-CM | POA: Diagnosis not present

## 2024-05-23 DIAGNOSIS — F419 Anxiety disorder, unspecified: Secondary | ICD-10-CM

## 2024-05-23 MED ORDER — CEFTRIAXONE SODIUM 1 G IJ SOLR
1.0000 g | INTRAMUSCULAR | 0 refills | Status: DC
Start: 2024-05-23 — End: 2024-05-25

## 2024-05-23 MED ORDER — BD ECLIPSE SYRINGE/NEEDLE 23G X 1" 3 ML MISC: 0 refills | Status: DC

## 2024-05-23 MED ORDER — CEFTRIAXONE SODIUM 1 G IJ SOLR
1.0000 g | Freq: Once | INTRAMUSCULAR | Status: AC
  Administered 2024-05-23: 1 g via INTRAMUSCULAR

## 2024-05-23 MED ORDER — LIDOCAINE HCL (PF) 1 % IJ SOLN: INTRAMUSCULAR | 0 refills | Status: DC

## 2024-05-23 MED ORDER — HYDROMORPHONE HCL 4 MG PO TABS: 4.0000 mg | ORAL_TABLET | Freq: Four times a day (QID) | ORAL | 0 refills | Status: DC | PRN

## 2024-05-23 NOTE — Progress Notes (Signed)
 Subjective:  Patient ID: Christian Kennedy, male    DOB: 07/22/1966  Age: 58 y.o. MRN: [id number]  CC: Hospitalization Follow-up   HPI Harvin E Dagley presents for lower abd pain from UTI - E coli resistant to multiple antibiotics IV abx given in ER on 8/25  helped immediately w/pain in the suprapubic area that she normally gets with urinary tract infection Oral antibiotic was not effective  Outpatient Medications Prior to Visit  Medication Sig Dispense Refill   Aspirin-Acetaminophen -Caffeine (GOODYS EXTRA STRENGTH) 520-260-32.5 MG PACK Take 1-2 Packages by mouth every 6 (six) hours as needed (Pain).     b complex vitamins capsule Take 1 capsule by mouth daily.     BLACK CURRANT SEED OIL PO Take 1 capsule by mouth daily.     Cholecalciferol (VITAMIN D3) 50 MCG (2000 UT) CAPS Take 1 capsule (2,000 Units total) by mouth daily. 100 capsule 3   RABEprazole  (ACIPHEX ) 20 MG tablet Take 1 tablet (20 mg total) by mouth 2 (two) times daily. 180 tablet 3   sulfamethoxazole -trimethoprim  (BACTRIM  DS) 800-160 MG tablet Take 1 tablet by mouth 2 (two) times daily for 10 days. 20 tablet 0   traMADol  (ULTRAM ) 50 MG tablet Take 1 tablet (50 mg total) by mouth every 8 (eight) hours as needed. 90 tablet 3   No facility-administered medications prior to visit.    ROS: Review of Systems  Constitutional:  Positive for chills and fatigue. Negative for appetite change and unexpected weight change.  HENT:  Negative for congestion, nosebleeds, sneezing, sore throat and trouble swallowing.   Eyes:  Negative for itching and visual disturbance.  Respiratory:  Negative for cough.   Cardiovascular:  Negative for chest pain, palpitations and leg swelling.  Gastrointestinal:  Positive for abdominal pain. Negative for abdominal distention, blood in stool, diarrhea and nausea.  Genitourinary:  Negative for frequency, hematuria and scrotal swelling.  Musculoskeletal:  Positive for arthralgias and back pain. Negative  for gait problem, joint swelling and neck pain.  Skin:  Negative for rash.  Neurological:  Negative for dizziness, tremors, speech difficulty and weakness.  Psychiatric/Behavioral:  Negative for agitation, dysphoric mood and sleep disturbance. The patient is not nervous/anxious.     Objective:  BP 122/77   Pulse 82   Temp 97.6 F (36.4 C) (Oral)   Ht 6' 3 (1.905 m)   Wt 255 lb (115.7 kg)   SpO2 100%   BMI 31.87 kg/m   BP Readings from Last 3 Encounters:  05/23/24 122/77  05/22/24 137/78  05/09/24 129/81    Wt Readings from Last 3 Encounters:  05/23/24 255 lb (115.7 kg)  05/09/24 251 lb (113.9 kg)  05/05/24 249 lb 14.4 oz (113.4 kg)    Physical Exam Constitutional:      General: He is not in acute distress.    Appearance: Normal appearance. He is well-developed.     Comments: NAD  Eyes:     Conjunctiva/sclera: Conjunctivae normal.     Pupils: Pupils are equal, round, and reactive to light.  Neck:     Thyroid : No thyromegaly.     Vascular: No JVD.  Cardiovascular:     Rate and Rhythm: Normal rate and regular rhythm.     Heart sounds: Normal heart sounds. No murmur heard.    No friction rub. No gallop.  Pulmonary:     Effort: Pulmonary effort is normal. No respiratory distress.     Breath sounds: Normal breath sounds. No wheezing or rales.  Chest:  Chest wall: No tenderness.  Abdominal:     General: Bowel sounds are normal. There is no distension.     Palpations: Abdomen is soft. There is no mass.     Tenderness: There is no abdominal tenderness. There is no guarding or rebound.  Musculoskeletal:        General: No tenderness. Normal range of motion.     Cervical back: Normal range of motion.  Lymphadenopathy:     Cervical: No cervical adenopathy.  Skin:    General: Skin is warm and dry.     Findings: No rash.  Neurological:     Mental Status: He is alert and oriented to person, place, and time.     Cranial Nerves: No cranial nerve deficit.     Motor:  No abnormal muscle tone.     Coordination: Coordination normal.     Gait: Gait normal.     Deep Tendon Reflexes: Reflexes are normal and symmetric.  Psychiatric:        Behavior: Behavior normal.        Thought Content: Thought content normal.        Judgment: Judgment normal.   Abdomen is sensitive in the suprapubic area Nephrostomies in place Urine appears clear  Lab Results  Component Value Date   WBC 7.8 05/22/2024   HGB 15.0 05/22/2024   HCT 44.6 05/22/2024   PLT 212 05/22/2024   GLUCOSE 105 (H) 05/22/2024   CHOL 214 (H) 12/29/2023   TRIG 258.0 (H) 12/29/2023   HDL 43.10 12/29/2023   LDLDIRECT 132.0 10/08/2021   LDLCALC 119 (H) 12/29/2023   ALT 15 05/22/2024   AST 12 (L) 05/22/2024   NA 139 05/22/2024   K 3.8 05/22/2024   CL 99 05/22/2024   CREATININE 1.09 05/22/2024   BUN 20 05/22/2024   CO2 29 05/22/2024   TSH 0.83 12/29/2023   PSA 1.18 10/08/2021   INR 1.1 05/05/2024   HGBA1C 5.4 05/05/2024    No results found.  Assessment & Plan:   Problem List Items Addressed This Visit     Anxiety   Doing fair      Bladder tumor   Follow-up with oncology/urology Tramadol  as needed pain Status post nephrostomy      Complicated UTI (urinary tract infection) - Primary   UTI - E coli resistant to multiple antibiotics IV abx given in ER on 8/25  helped immediately w/pain in the suprapubic area that she normally gets with urinary tract infection Oral antibiotic was not effective Start Rocephin  1 g IM daily in the office or at home      GERD (gastroesophageal reflux disease)   Aciphex  to 20 mg twice daily Tums as needed         Meds ordered this encounter  Medications   DISCONTD: cefTRIAXone  (ROCEPHIN ) 1 g injection    Sig: Inject 1 g into the muscle daily.    Dispense:  10 each    Refill:  0    Resistent E coli UTI   HYDROmorphone  (DILAUDID ) 4 MG tablet    Sig: Take 1 tablet (4 mg total) by mouth every 6 (six) hours as needed for severe pain (pain  score 7-10).    Dispense:  20 tablet    Refill:  0   cefTRIAXone  (ROCEPHIN ) injection 1 g   DISCONTD: lidocaine , PF, (XYLOCAINE ) 1 % SOLN injection    Sig: Use 1-2 ml as diluent  for 1 g Rocephin  IM injections daily    Dispense:  10 mL    Refill:  0   DISCONTD: SYRINGE-NEEDLE, DISP, 3 ML (BD ECLIPSE SYRINGE/NEEDLE) 23G X 1 3 ML MISC    Sig: Use every day for IM injections (Rocephin )    Dispense:  50 each    Refill:  0      Follow-up: No follow-ups on file.  Marolyn Noel, MD

## 2024-05-24 ENCOUNTER — Ambulatory Visit (INDEPENDENT_AMBULATORY_CARE_PROVIDER_SITE_OTHER)

## 2024-05-24 DIAGNOSIS — N390 Urinary tract infection, site not specified: Secondary | ICD-10-CM | POA: Diagnosis not present

## 2024-05-24 MED ORDER — CEFTRIAXONE SODIUM 1 G IJ SOLR
1.0000 g | Freq: Once | INTRAMUSCULAR | Status: AC
Start: 2024-05-24 — End: 2024-05-24
  Administered 2024-05-24: 1 g via INTRAMUSCULAR

## 2024-05-24 NOTE — Progress Notes (Cosign Needed Addendum)
 Patient presents in office today for 2nd Ceftriaxone  injection per Dr. Garald. Tolerated injection well, scheduled for Thursday and Friday injections  Medical screening examination/treatment/procedure(s) were performed by non-physician practitioner and as supervising physician I was immediately available for consultation/collaboration.  I agree with above. Karlynn Garald, MD

## 2024-05-25 ENCOUNTER — Ambulatory Visit

## 2024-05-25 ENCOUNTER — Other Ambulatory Visit: Payer: Self-pay

## 2024-05-25 ENCOUNTER — Other Ambulatory Visit (HOSPITAL_COMMUNITY): Payer: Self-pay

## 2024-05-25 DIAGNOSIS — N3000 Acute cystitis without hematuria: Secondary | ICD-10-CM

## 2024-05-25 DIAGNOSIS — N390 Urinary tract infection, site not specified: Secondary | ICD-10-CM | POA: Diagnosis not present

## 2024-05-25 MED ORDER — CEFTRIAXONE SODIUM 1 G IJ SOLR
1.0000 g | Freq: Once | INTRAMUSCULAR | Status: AC
  Administered 2024-05-25: 1 g via INTRAMUSCULAR

## 2024-05-25 MED ORDER — LIDOCAINE HCL 1 % IJ SOLN
INTRAMUSCULAR | 0 refills | Status: AC
  Filled 2024-05-25: qty 10, 5d supply, fill #0

## 2024-05-25 MED ORDER — CEFTRIAXONE SODIUM 1 G IJ SOLR
1.0000 g | INTRAMUSCULAR | 0 refills | Status: DC
  Filled 2024-05-25: qty 5, 5d supply, fill #0

## 2024-05-25 MED ORDER — BD ECLIPSE SYRINGE/NEEDLE 23G X 1" 3 ML MISC
0 refills | Status: AC
  Filled 2024-05-25: qty 50, 50d supply, fill #0

## 2024-05-25 NOTE — Progress Notes (Cosign Needed Addendum)
 Patient presents in office today for 3rd Ceftriaxone  injection per Dr.Plotnikov. Tolerated injection well, scheduled for Friday injection.  Medical screening examination/treatment/procedure(s) were performed by non-physician practitioner and as supervising physician I was immediately available for consultation/collaboration.  I agree with above. Karlynn Noel, MD

## 2024-05-26 ENCOUNTER — Other Ambulatory Visit: Payer: Self-pay

## 2024-05-26 ENCOUNTER — Ambulatory Visit (INDEPENDENT_AMBULATORY_CARE_PROVIDER_SITE_OTHER)

## 2024-05-26 ENCOUNTER — Encounter: Payer: Self-pay | Admitting: Internal Medicine

## 2024-05-26 ENCOUNTER — Other Ambulatory Visit (HOSPITAL_COMMUNITY): Payer: Self-pay

## 2024-05-26 DIAGNOSIS — N390 Urinary tract infection, site not specified: Secondary | ICD-10-CM

## 2024-05-26 MED ORDER — CEFTRIAXONE SODIUM 1 G IJ SOLR
1.0000 g | Freq: Once | INTRAMUSCULAR | Status: AC
  Administered 2024-05-26: 1 g via INTRAMUSCULAR

## 2024-05-26 NOTE — Progress Notes (Signed)
 Pt was given 4 of 10 Abx injection w/o any complications at this time.

## 2024-05-27 LAB — CULTURE, BLOOD (ROUTINE X 2)
Culture: NO GROWTH
Culture: NO GROWTH

## 2024-05-28 ENCOUNTER — Encounter: Payer: Self-pay | Admitting: Internal Medicine

## 2024-05-28 NOTE — Assessment & Plan Note (Signed)
 Aciphex  to 20 mg twice daily Tums as needed

## 2024-05-28 NOTE — Assessment & Plan Note (Signed)
Doing fair 

## 2024-05-28 NOTE — Assessment & Plan Note (Signed)
 Follow-up with oncology/urology Tramadol  as needed pain Status post nephrostomy

## 2024-05-28 NOTE — Assessment & Plan Note (Signed)
 UTI - E coli resistant to multiple antibiotics IV abx given in ER on 8/25  helped immediately w/pain in the suprapubic area that she normally gets with urinary tract infection Oral antibiotic was not effective Start Rocephin  1 g IM daily in the office or at home

## 2024-06-05 ENCOUNTER — Other Ambulatory Visit (HOSPITAL_COMMUNITY): Payer: Self-pay | Admitting: Diagnostic Radiology

## 2024-06-05 ENCOUNTER — Ambulatory Visit (HOSPITAL_COMMUNITY)
Admission: RE | Admit: 2024-06-05 | Discharge: 2024-06-05 | Disposition: A | Discharge status: Home / Self Care | Payer: Self-pay | Source: Ambulatory Visit | Attending: Interventional Radiology | Admitting: Interventional Radiology

## 2024-06-05 DIAGNOSIS — Z436 Encounter for attention to other artificial openings of urinary tract: Secondary | ICD-10-CM | POA: Diagnosis present

## 2024-06-05 DIAGNOSIS — C679 Malignant neoplasm of bladder, unspecified: Secondary | ICD-10-CM

## 2024-06-05 HISTORY — PX: IR NEPHROSTOMY EXCHANGE RIGHT: IMG6070

## 2024-06-05 MED ORDER — IOHEXOL 300 MG/ML  SOLN
50.0000 mL | Freq: Once | INTRAMUSCULAR | Status: AC | PRN
  Administered 2024-06-05: 10 mL

## 2024-06-05 NOTE — Procedures (Signed)
 Interventional Radiology Procedure:   Indications: Chronic right nephrostomy tube  Procedure: Exchange of nephrostomy tube  Findings: New 10 Fr tube placed in right renal pelvis  Complications: None     EBL: Minimal  Plan: Continue with routine nephrostomy tube exchanges  Keosha Rossa R. Philip, MD  Pager: [phone]

## 2024-06-06 ENCOUNTER — Other Ambulatory Visit (HOSPITAL_COMMUNITY)

## 2024-06-07 ENCOUNTER — Other Ambulatory Visit (HOSPITAL_COMMUNITY)

## 2024-06-07 ENCOUNTER — Other Ambulatory Visit (HOSPITAL_COMMUNITY): Payer: Self-pay

## 2024-06-28 ENCOUNTER — Ambulatory Visit (INDEPENDENT_AMBULATORY_CARE_PROVIDER_SITE_OTHER): Payer: Self-pay | Admitting: Urology

## 2024-06-28 VITALS — BP 126/74 | HR 90

## 2024-06-28 DIAGNOSIS — C672 Malignant neoplasm of lateral wall of bladder: Secondary | ICD-10-CM

## 2024-06-28 DIAGNOSIS — R350 Frequency of micturition: Secondary | ICD-10-CM

## 2024-06-28 DIAGNOSIS — N3001 Acute cystitis with hematuria: Secondary | ICD-10-CM

## 2024-06-28 LAB — URINALYSIS, ROUTINE W REFLEX MICROSCOPIC
Bilirubin, UA: NEGATIVE
Glucose, UA: NEGATIVE
Ketones, UA: NEGATIVE
Nitrite, UA: NEGATIVE
Protein,UA: NEGATIVE
Specific Gravity, UA: 1.015 (ref 1.005–1.030)
Urobilinogen, Ur: 0.2 mg/dL (ref 0.2–1.0)
pH, UA: 6 (ref 5.0–7.5)

## 2024-06-28 LAB — MICROSCOPIC EXAMINATION
RBC, Urine: >30 /HPF — AB (ref 0–2)
WBC, UA: >30 /HPF — AB (ref 0–5)

## 2024-06-28 MED ORDER — TAMSULOSIN HCL 0.4 MG PO CAPS: 0.4000 mg | ORAL_CAPSULE | Freq: Every day | ORAL | 11 refills | Status: DC

## 2024-06-28 MED ORDER — SULFAMETHOXAZOLE-TRIMETHOPRIM 800-160 MG PO TABS: 1.0000 | ORAL_TABLET | Freq: Two times a day (BID) | ORAL | 0 refills | Status: DC

## 2024-06-28 NOTE — Progress Notes (Signed)
 06/28/2024 10:44 AM   Christian Kennedy 23-May-1966 [id number]  Referring provider: Garald [name], MD [address] [place],  [place] [address]  Followup bladder cancer   HPI: [name] is a 58yo here for followup for bladder cancer and for evaluation of frequent UTI. He was hospitalized beginning of August with a UTI. UA today is concerning for infection. CT 05/22/24 shows a likely recurrent bladder mass. His muscle invasive bladder cancer is treated with surveillance per the patients request. He has right ureteral obstruction managed with a right nephrostomy tube. IPSS 31 QOL 6 on no BPH therapy.    PMH: Past Medical History:  Diagnosis Date   Abscess    Right buttocks   Bladder tumor    Depression    Son passed 2014   Ear infection    Eating disorder    Frequent headaches    GERD (gastroesophageal reflux disease)    Helicobacter pylori ab+ 10/27/2017   Hepatitis    childhood, jaundice   Migraines    Urinary incontinence    Varicose vein of leg    Right leg   Vitamin D  deficiency     Surgical History: Past Surgical History:  Procedure Laterality Date   CYSTOSCOPY W/ RETROGRADES Bilateral 11/12/2022   Procedure: CYSTOSCOPY WITH RETROGRADE PYELOGRAM;  Surgeon: Sherrilee Belvie CROME, MD;  Location: [facility];  Service: Urology;  Laterality: Bilateral;   CYSTOSCOPY W/ URETERAL STENT PLACEMENT Right 11/19/2017   Procedure: CYSTOSCOPY WITH RETROGRADE PYELOGRAM/URETERAL STENT PLACEMENT;  Surgeon: Devere Lonni Righter, MD;  Location: [facility];  Service: Urology;  Laterality: Right;   CYSTOSCOPY WITH FULGERATION N/A 11/19/2017   Procedure: CYSTOSCOPY WITH FULGERATION AND CLOT EVACUATION;  Surgeon: Chauncey Redell Agent, MD;  Location: [facility];  Service: Urology;  Laterality: N/A;   IR NEPHROSTOMY EXCHANGE RIGHT  07/20/2022   IR NEPHROSTOMY EXCHANGE RIGHT  10/06/2022   IR NEPHROSTOMY EXCHANGE RIGHT  12/01/2022   IR NEPHROSTOMY EXCHANGE RIGHT  02/02/2023   IR  NEPHROSTOMY EXCHANGE RIGHT  04/06/2023   IR NEPHROSTOMY EXCHANGE RIGHT  04/20/2023   IR NEPHROSTOMY EXCHANGE RIGHT  06/15/2023   IR NEPHROSTOMY EXCHANGE RIGHT  08/17/2023   IR NEPHROSTOMY EXCHANGE RIGHT  09/17/2023   IR NEPHROSTOMY EXCHANGE RIGHT  11/08/2023   IR NEPHROSTOMY EXCHANGE RIGHT  12/21/2023   IR NEPHROSTOMY EXCHANGE RIGHT  02/15/2024   IR NEPHROSTOMY EXCHANGE RIGHT  04/11/2024   IR NEPHROSTOMY EXCHANGE RIGHT  06/05/2024   IR NEPHROSTOMY PLACEMENT RIGHT  05/26/2022   TRANSURETHRAL RESECTION OF BLADDER TUMOR N/A 11/19/2017   Procedure: TRANSURETHRAL RESECTION OF BLADDER TUMOR (TURBT);  Surgeon: Devere Lonni Righter, MD;  Location: [facility];  Service: Urology;  Laterality: N/A;   TRANSURETHRAL RESECTION OF BLADDER TUMOR N/A 05/18/2022   Procedure: TRANSURETHRAL RESECTION OF BLADDER TUMOR (TURBT);  Surgeon: Sherrilee Belvie CROME, MD;  Location: [facility];  Service: Urology;  Laterality: N/A;   TRANSURETHRAL RESECTION OF BLADDER TUMOR N/A 11/12/2022   Procedure: TRANSURETHRAL RESECTION OF BLADDER TUMOR (TURBT);  Surgeon: Sherrilee Belvie CROME, MD;  Location: [facility];  Service: Urology;  Laterality: N/A;    Home Medications:  Allergies as of 06/28/2024       Reactions   Macrobid  [nitrofurantoin ]    ?epig abd pain        Medication List        Accurate as of June 28, 2024 10:44 AM. If you have any questions, ask your nurse or doctor.  b complex vitamins capsule Take 1 capsule by mouth daily.   B-D 3CC LUER-LOK SYR 23GX1 23G X 1 3 ML Misc Generic drug: SYRINGE-NEEDLE (DISP) 3 ML Use every day for IM injections (Rocephin )   BLACK CURRANT SEED OIL PO Take 1 capsule by mouth daily.   cefTRIAXone  1 g injection Commonly known as: ROCEPHIN  Inject 1 g into the muscle daily.   Goodys Extra Strength 520-260-32.5 MG Pack Generic drug: Aspirin-Acetaminophen -Caffeine Take 1-2 Packages by mouth every 6 (six) hours as needed (Pain).   HYDROmorphone  4 MG  tablet Commonly known as: Dilaudid  Take 1 tablet (4 mg total) by mouth every 6 (six) hours as needed for severe pain (pain score 7-10).   lidocaine  1 % (with preservative) injection Commonly known as: XYLOCAINE  Use 1-2 ML as diluent  for 1G Rocephin  IM injection daily   RABEprazole  20 MG tablet Commonly known as: Aciphex  Take 1 tablet (20 mg total) by mouth 2 (two) times daily.   traMADol  50 MG tablet Commonly known as: ULTRAM  Take 1 tablet (50 mg total) by mouth every 8 (eight) hours as needed.   Vitamin D3 50 MCG (2000 UT) capsule Take 1 capsule (2,000 Units total) by mouth daily.        Allergies:  Allergies  Allergen Reactions   Macrobid  [Nitrofurantoin ]     ?epig abd pain    Family History: Family History  Problem Relation Age of Onset   Cancer Mother    Depression Mother    Arthritis Father    Depression Father    Hearing loss Father    Depression Daughter    Early death Son     Social History:  reports that he has been smoking cigarettes. He has a 30 pack-year smoking history. He has never used smokeless tobacco. He reports that he does not drink alcohol and does not use drugs.  ROS: All other review of systems were reviewed and are negative except what is noted above in HPI  Physical Exam: BP 126/74   Pulse 90   Constitutional:  Alert and oriented, No acute distress. HEENT: [place] AT, moist mucus membranes.  Trachea midline, no masses. Cardiovascular: No clubbing, cyanosis, or edema. Respiratory: Normal respiratory effort, no increased work of breathing. GI: Abdomen is soft, nontender, nondistended, no abdominal masses GU: No CVA tenderness.  Lymph: No cervical or inguinal lymphadenopathy. Skin: No rashes, bruises or suspicious lesions. Neurologic: Grossly intact, no focal deficits, moving all 4 extremities. Psychiatric: Normal mood and affect.  Laboratory Data: Lab Results  Component Value Date   WBC 7.8 05/22/2024   HGB 15.0 05/22/2024   HCT 44.6  05/22/2024   MCV 91.6 05/22/2024   PLT 212 05/22/2024    Lab Results  Component Value Date   CREATININE 1.09 05/22/2024    Lab Results  Component Value Date   PSA 1.18 10/08/2021   PSA 1.30 09/15/2017    Lab Results  Component Value Date   TESTOSTERONE  261.70 (L) 09/15/2017    Lab Results  Component Value Date   HGBA1C 5.4 05/05/2024    Urinalysis    Component Value Date/Time   COLORURINE YELLOW 05/22/2024 0300   APPEARANCEUR CLEAR 05/22/2024 0300   APPEARANCEUR Clear 10/05/2022 0842   LABSPEC 1.016 05/22/2024 0300   PHURINE 8.0 05/22/2024 0300   GLUCOSEU NEGATIVE 05/22/2024 0300   GLUCOSEU NEGATIVE 12/29/2023 1552   HGBUR SMALL (A) 05/22/2024 0300   BILIRUBINUR NEGATIVE 05/22/2024 0300   BILIRUBINUR Negative 10/05/2022 0842   KETONESUR NEGATIVE 05/22/2024 0300  PROTEINUR NEGATIVE 05/22/2024 0300   UROBILINOGEN 0.2 12/29/2023 1552   NITRITE NEGATIVE 05/22/2024 0300   LEUKOCYTESUR SMALL (A) 05/22/2024 0300    Lab Results  Component Value Date   LABMICR See below: 10/05/2022   WBCUA 0-5 10/05/2022   LABEPIT 0-10 10/05/2022   MUCUS Presence of (A) 12/29/2023   BACTERIA NONE SEEN 05/22/2024    Pertinent Imaging: CT 05/22/24: Images reviewed and discussed with the patient  No results found for this or any previous visit.  No results found for this or any previous visit.  No results found for this or any previous visit.  No results found for this or any previous visit.  No results found for this or any previous visit.  No results found for this or any previous visit.  No results found for this or any previous visit.  No results found for this or any previous visit.   Assessment & Plan:    1. Malignant neoplasm of lateral wall of urinary bladder (HCC) (Primary) -schedule for cystoscopy - Urinalysis, Routine w reflex microscopic  2. Acute cystitis with hematuria -urine for culture -bactrim  DS BID for 7 days  3. Frequency -flomax 0.4mg   daily   No follow-ups on file.  Belvie Clara, MD  [facility] [place]

## 2024-07-01 LAB — URINE CULTURE

## 2024-07-04 ENCOUNTER — Telehealth: Payer: Self-pay | Admitting: Urology

## 2024-07-04 ENCOUNTER — Encounter: Payer: Self-pay | Admitting: Urology

## 2024-07-04 NOTE — Patient Instructions (Signed)

## 2024-07-04 NOTE — Telephone Encounter (Signed)
 Return call to pt. Pt state's the abt is not helping and he is burning really bad. Pt state's when he look at his my chart it shows sulfamethoxazole -trimethoprim  is Resistant. Pt would like an alternative abt. Verbalized understanding

## 2024-07-04 NOTE — Telephone Encounter (Signed)
 Patient called into the office today with general questions/concerns regarding urine culture. Says antibiotic is not the right one for bacteria. It is not helping him Patient may be reached at [phone] to discuss questions.

## 2024-07-06 ENCOUNTER — Ambulatory Visit: Payer: Self-pay

## 2024-07-06 NOTE — Telephone Encounter (Signed)
 FYI Only or Action Required?: Action required by provider: clinical question for provider and update on patient condition.  Patient was last seen in primary care on 05/23/2024 by Plotnikov, [name], MD.  Called Nurse Triage reporting Recurrent UTI.  Symptoms began several months ago.  Interventions attempted: Prescription medications: Rocephin  IM injections, Bactrim .  Symptoms are: blood in urine yesterday completely resolved. Bilateral flank pain, painful urination, sweating and chills.  Triage Disposition: See HCP Within 4 Hours (Or PCP Triage)  Patient/caregiver understands and will follow disposition?: No, wishes to speak with PCP        Copied from CRM #[id number]. Topic: Clinical - Red Word Triage >> Jul 06, 2024  2:05 PM Robinson H wrote: [facility] that prompted transfer to Nurse Triage: Urologist gave antibiotic and not helping, pain with urination Reason for Disposition  [1] SEVERE pain (e.g., excruciating) AND [2] no improvement 2 hours after pain medications  Answer Assessment - Initial Assessment Questions 1. MAIN SYMPTOM: What is the main symptom you are concerned about? (e.g., painful urination, urine frequency)     Painful urination and he states now he is having flank pain on both sides.  2. BETTER-SAME-WORSE: Are you getting better, staying the same, or getting worse compared to how you felt at your last visit to the doctor (most recent medical visit)?     Worse.  3. PAIN: How bad is the pain?  (e.g., Scale 1-10; mild, moderate, or severe)     Severe. He states it is not constant but when it hits it is more than 10/10.  4. FEVER: Do you have a fever? If Yes, ask: What is it, how was it measured, and when did it start?     He states yesterday he was sweating and experienced chills during work, he did not have a thermometer to measure it but thinks possibly he did have one.  5. OTHER SYMPTOMS: Do you have any other symptoms? (e.g., blood in the urine,  flank pain, scrotal pain or swelling)     Blood in urine yesterday, resolved today. Denies nausea, vomiting.  6. DIAGNOSIS: When was the UTI diagnosed? By whom? Was it a kidney infection, bladder infection or both?     05/22/24 in ED. He has been having recurrent bladder infections.  7. ANTIBIOTIC: What antibiotic(s) are you taking? How many times per day?     Completed IM Rocephin  injections. Oral Bactrim .  8. ANTIBIOTIC - START DATE: When did you start taking the antibiotic?     He states he has been on antibiotics since 05/22/24 ED visit.  Protocols used: Urinary Tract Infection on Antibiotic Follow-up Call - Male-A-AH

## 2024-07-07 ENCOUNTER — Ambulatory Visit: Payer: Self-pay

## 2024-07-07 MED ORDER — CIPROFLOXACIN HCL 500 MG PO TABS: 500.0000 mg | ORAL_TABLET | Freq: Two times a day (BID) | ORAL | 0 refills | Status: DC

## 2024-07-07 NOTE — Telephone Encounter (Signed)
 Urine culture from 06/28/2024 with Enterococcus faecalis sensitive to multiple antibiotics.  What antibiotic is he taking now? Thanks

## 2024-07-07 NOTE — Telephone Encounter (Signed)
 Patient called and made aware of positive urine culture and new abt set to pharmacy.

## 2024-07-08 ENCOUNTER — Other Ambulatory Visit: Payer: Self-pay | Admitting: Internal Medicine

## 2024-07-08 MED ORDER — AMOXICILLIN-POT CLAVULANATE 875-125 MG PO TABS: 1.0000 | ORAL_TABLET | Freq: Two times a day (BID) | ORAL | 0 refills | Status: DC

## 2024-07-14 ENCOUNTER — Emergency Department (HOSPITAL_COMMUNITY)
Admission: EM | Admit: 2024-07-14 | Discharge: 2024-07-15 | Disposition: A | Payer: Self-pay | Attending: Emergency Medicine | Admitting: Emergency Medicine

## 2024-07-14 ENCOUNTER — Other Ambulatory Visit: Payer: Self-pay

## 2024-07-14 ENCOUNTER — Encounter (HOSPITAL_COMMUNITY): Payer: Self-pay

## 2024-07-14 DIAGNOSIS — Z7982 Long term (current) use of aspirin: Secondary | ICD-10-CM | POA: Insufficient documentation

## 2024-07-14 DIAGNOSIS — N3090 Cystitis, unspecified without hematuria: Secondary | ICD-10-CM | POA: Insufficient documentation

## 2024-07-14 LAB — URINALYSIS, ROUTINE W REFLEX MICROSCOPIC
Bacteria, UA: NONE SEEN
Bilirubin Urine: NEGATIVE
Glucose, UA: NEGATIVE mg/dL
Ketones, ur: NEGATIVE mg/dL
Nitrite: NEGATIVE
Protein, ur: NEGATIVE mg/dL
Specific Gravity, Urine: 1.016 (ref 1.005–1.030)
pH: 6 (ref 5.0–8.0)

## 2024-07-14 LAB — COMPREHENSIVE METABOLIC PANEL WITH GFR
ALT: 13 U/L (ref 0–44)
AST: 14 U/L — ABNORMAL LOW (ref 15–41)
Albumin: 4.3 g/dL (ref 3.5–5.0)
Alkaline Phosphatase: 64 U/L (ref 38–126)
Anion gap: 10 (ref 5–15)
BUN: 26 mg/dL — ABNORMAL HIGH (ref 6–20)
CO2: 24 mmol/L (ref 22–32)
Calcium: 9 mg/dL (ref 8.9–10.3)
Chloride: 104 mmol/L (ref 98–111)
Creatinine, Ser: 1.05 mg/dL (ref 0.61–1.24)
GFR, Estimated: >60 mL/min (ref >60)
Glucose, Bld: 102 mg/dL — ABNORMAL HIGH (ref 70–99)
Potassium: 4 mmol/L (ref 3.5–5.1)
Sodium: 138 mmol/L (ref 135–145)
Total Bilirubin: 0.3 mg/dL (ref 0.0–1.2)
Total Protein: 6.8 g/dL (ref 6.5–8.1)

## 2024-07-14 LAB — CBC WITH DIFFERENTIAL/PLATELET
Abs Immature Granulocytes: 0.03 K/uL (ref 0.00–0.07)
Basophils Absolute: 0.1 K/uL (ref 0.0–0.1)
Basophils Relative: 1 %
Eosinophils Absolute: 0.3 K/uL (ref 0.0–0.5)
Eosinophils Relative: 3 %
HCT: 42.2 % (ref 39.0–52.0)
Hemoglobin: 14.2 g/dL (ref 13.0–17.0)
Immature Granulocytes: 0 %
Lymphocytes Relative: 23 %
Lymphs Abs: 2.4 K/uL (ref 0.7–4.0)
MCH: 30.9 pg (ref 26.0–34.0)
MCHC: 33.6 g/dL (ref 30.0–36.0)
MCV: 91.7 fL (ref 80.0–100.0)
Monocytes Absolute: 0.8 K/uL (ref 0.1–1.0)
Monocytes Relative: 8 %
Neutro Abs: 6.7 K/uL (ref 1.7–7.7)
Neutrophils Relative %: 65 %
Platelets: 195 K/uL (ref 150–400)
RBC: 4.6 MIL/uL (ref 4.22–5.81)
RDW: 13.2 % (ref 11.5–15.5)
WBC: 10.2 K/uL (ref 4.0–10.5)
nRBC: 0 % (ref 0.0–0.2)

## 2024-07-14 LAB — LIPASE, BLOOD: Lipase: 42 U/L (ref 11–51)

## 2024-07-14 NOTE — ED Notes (Signed)
 Patient in restroom attempting to get urine sample

## 2024-07-14 NOTE — ED Triage Notes (Signed)
 Pov from home cc of abdominal pain for 2 months that never went away.  No n/v/d.  8/10   Took pain pill an hour PTA.

## 2024-07-15 MED ORDER — SODIUM CHLORIDE 0.9 % IV SOLN
1.0000 g | Freq: Once | INTRAVENOUS | Status: AC
  Administered 2024-07-15: 1 g via INTRAVENOUS
  Filled 2024-07-15: qty 10

## 2024-07-15 NOTE — ED Provider Notes (Signed)
  EMERGENCY DEPARTMENT AT [facility]  Provider Note  CSN: [id number] Arrival date & time: 07/14/24 2222  History Chief Complaint  Patient presents with   Abdominal Pain    Christian Kennedy is a 58 y.o. male with history of bladder tumor, chronic R nephrostomy and recurrent UTI reports he has been unable to clear bladder infection for the last 2 months. He has been on multiple Abx including recent cipro  and augmentin  orally. He reports continued dysuria but no bleeding. He has had clear urine draining from his nephrostomy. He reports the only thing that seemed to help was when he was getting rocephin  in August (initial dose in ED, then subsequent IM doses at PCP office and then home doses over a weekend/holiday). No fever, no vomiting.    Home Medications Prior to Admission medications   Medication Sig Start Date End Date Taking? Authorizing Provider  amoxicillin -clavulanate (AUGMENTIN ) 875-125 MG tablet Take 1 tablet by mouth 2 (two) times daily. 07/08/24   Plotnikov, Aleksei V, MD  Aspirin-Acetaminophen -Caffeine (GOODYS EXTRA STRENGTH) 520-260-32.5 MG PACK Take 1-2 Packages by mouth every 6 (six) hours as needed (Pain).    [provider]  b complex vitamins capsule Take 1 capsule by mouth daily.    [provider]  BLACK CURRANT SEED OIL PO Take 1 capsule by mouth daily.    [provider]  Cholecalciferol (VITAMIN D3) 50 MCG (2000 UT) CAPS Take 1 capsule (2,000 Units total) by mouth daily. 11/17/22   Plotnikov, Aleksei V, MD  HYDROmorphone  (DILAUDID ) 4 MG tablet Take 1 tablet (4 mg total) by mouth every 6 (six) hours as needed for severe pain (pain score 7-10). 05/23/24   Plotnikov, [name], MD  lidocaine  (XYLOCAINE ) 1 % (with preservative) injection Use 1-2 ML as diluent  for 1G Rocephin  IM injection daily 05/25/24   Plotnikov, Aleksei V, MD  RABEprazole  (ACIPHEX ) 20 MG tablet Take 1 tablet (20 mg total) by mouth 2 (two) times daily. 05/09/24    Plotnikov, Aleksei V, MD  SYRINGE-NEEDLE, DISP, 3 ML (BD ECLIPSE SYRINGE/NEEDLE) 23G X 1 3 ML MISC Use every day for IM injections (Rocephin ) 05/25/24   Plotnikov, Aleksei V, MD  tamsulosin (FLOMAX) 0.4 MG CAPS capsule Take 1 capsule (0.4 mg total) by mouth daily after supper. 06/28/24   McKenzie, Belvie CROME, MD  traMADol  (ULTRAM ) 50 MG tablet Take 1 tablet (50 mg total) by mouth every 8 (eight) hours as needed. 05/09/24   Plotnikov, [name], MD     Allergies    Macrobid  [nitrofurantoin ]   Review of Systems   Review of Systems Please see HPI for pertinent positives and negatives  Physical Exam BP 134/76   Pulse 82   Temp 97.6 F (36.4 C)   Resp 17   Ht 6' 3 (1.905 m)   Wt 115.7 kg   SpO2 99%   BMI 31.88 kg/m   Physical Exam Vitals and nursing note reviewed.  Constitutional:      Appearance: Normal appearance.  HENT:     Head: Normocephalic and atraumatic.     Nose: Nose normal.     Mouth/Throat:     Mouth: Mucous membranes are moist.  Eyes:     Extraocular Movements: Extraocular movements intact.     Conjunctiva/sclera: Conjunctivae normal.  Cardiovascular:     Rate and Rhythm: Normal rate.  Pulmonary:     Effort: Pulmonary effort is normal.     Breath sounds: Normal breath sounds.  Abdominal:  General: Abdomen is flat.     Palpations: Abdomen is soft.     Tenderness: There is no abdominal tenderness. There is no guarding. Negative signs include Murphy's sign and McBurney's sign.     Comments: Nephrostomy in R kidney with clear urine in bag  Musculoskeletal:        General: No swelling. Normal range of motion.     Cervical back: Neck supple.  Skin:    General: Skin is warm and dry.  Neurological:     General: No focal deficit present.     Mental Status: He is alert.  Psychiatric:        Mood and Affect: Mood normal.     ED Results / Procedures / Treatments   EKG None  Procedures Procedures  Medications Ordered in the ED Medications   cefTRIAXone  (ROCEPHIN ) 1 g in sodium chloride  0.9 % 100 mL IVPB (1 g Intravenous New Bag/Given 07/15/24 0039)    Initial Impression and Plan  Patient here with persistent dysuria despite numerous Abx, exam is benign and vitals reassuring. Labs done in triage show normal CBC, CMP and lipase. UA consistent with continued infection. Discussed management with patient, he would like a dose of rocephin  now. I explained that subsequent doses cannot be arranged at home or in clinic over the weekend and offered admission but he declines and states he will see PCP on Monday for further therapy. Urine culture sent. RTED if pain worsens or for fever, bleeding or other concerns.   ED Course       MDM Rules/Calculators/A&P Medical Decision Making Problems Addressed: Cystitis: chronic illness or injury with exacerbation, progression, or side effects of treatment  Amount and/or Complexity of Data Reviewed Labs: ordered. Decision-making details documented in ED Course.  Risk Prescription drug management. Decision regarding hospitalization.     Final Clinical Impression(s) / ED Diagnoses Final diagnoses:  Cystitis    Rx / DC Orders ED Discharge Orders     None        Roselyn Carlin NOVAK, MD 07/15/24 [phone]

## 2024-07-16 LAB — URINE CULTURE: Culture: NO GROWTH

## 2024-07-19 ENCOUNTER — Ambulatory Visit: Payer: Self-pay

## 2024-07-19 NOTE — Telephone Encounter (Signed)
 FYI Only or Action Required?: FYI only for provider.  Patient was last seen in primary care on 05/23/2024 by Plotnikov, [name], MD.  Called Nurse Triage reporting Dysuria.  Symptoms began several months ago.  Interventions attempted: Prescription medications: Antibiotics.  Symptoms are: gradually worsening.  Triage Disposition: See Physician Within 24 Hours  Patient/caregiver understands and will follow disposition?: Yes   Copied from CRM #[id number]. Topic: Clinical - Red Word Triage >> Jul 19, 2024  8:49 AM [name] wrote: Red Word that prompted transfer to Nurse Triage: burning with urination; antibiotics previously prescribed for UTI have been ineffective. Reason for Disposition  All other males with painful urination  Answer Assessment - Initial Assessment Questions Patient states he went to Urologist and was given antibiotics, he states he has been on antibiotics for 2 months for this issue and no symptoms are resolving.   1. SEVERITY: How bad is the pain?  (e.g., Scale 1-10; mild, moderate, or severe)     He states it gets past 10 2. FREQUENCY: How many times have you had painful urination today?      Every time he urinates 4. ONSET: When did the painful urination start?      2 months 5. FEVER: Do you have a fever? If Yes, ask: What is your temperature, how was it measured, and when did it start?     No 6. PAST UTI: Have you had a urine infection before? If Yes, ask: When was the last time? and What happened that time?      Yes patient states he has been on antibiotics for 2 months 7. CAUSE: What do you think is causing the painful urination?      UTI 8. OTHER SYMPTOMS: Do you have any other symptoms? (e.g., flank pain, penis discharge, scrotal pain, blood in urine)     Scrotal pain  Protocols used: Urination Pain - Male-A-AH

## 2024-07-21 ENCOUNTER — Ambulatory Visit (INDEPENDENT_AMBULATORY_CARE_PROVIDER_SITE_OTHER): Payer: Self-pay | Admitting: Internal Medicine

## 2024-07-21 ENCOUNTER — Encounter: Payer: Self-pay | Admitting: Internal Medicine

## 2024-07-21 VITALS — BP 126/72 | HR 96 | Temp 98.2°F | Ht 75.0 in | Wt 260.0 lb

## 2024-07-21 DIAGNOSIS — R300 Dysuria: Secondary | ICD-10-CM

## 2024-07-21 DIAGNOSIS — R3989 Other symptoms and signs involving the genitourinary system: Secondary | ICD-10-CM

## 2024-07-21 DIAGNOSIS — F5101 Primary insomnia: Secondary | ICD-10-CM

## 2024-07-21 DIAGNOSIS — K2100 Gastro-esophageal reflux disease with esophagitis, without bleeding: Secondary | ICD-10-CM

## 2024-07-21 DIAGNOSIS — N3289 Other specified disorders of bladder: Secondary | ICD-10-CM

## 2024-07-21 MED ORDER — OXYBUTYNIN CHLORIDE 5 MG PO TABS: 5.0000 mg | ORAL_TABLET | Freq: Three times a day (TID) | ORAL | 3 refills | Status: DC

## 2024-07-21 MED ORDER — BUTALBITAL-APAP-CAFF-COD 50-325-40-30 MG PO CAPS: 1.0000 | ORAL_CAPSULE | Freq: Four times a day (QID) | ORAL | 1 refills | Status: AC | PRN

## 2024-07-21 MED ORDER — KETOROLAC TROMETHAMINE 10 MG PO TABS: 10.0000 mg | ORAL_TABLET | Freq: Three times a day (TID) | ORAL | 1 refills | Status: DC | PRN

## 2024-07-21 MED ORDER — RABEPRAZOLE SODIUM 20 MG PO TBEC: 20.0000 mg | DELAYED_RELEASE_TABLET | Freq: Two times a day (BID) | ORAL | 3 refills | Status: AC

## 2024-07-21 NOTE — Progress Notes (Signed)
 Subjective:  Patient ID: Christian Kennedy, male    DOB: 1966/07/20  Age: 58 y.o. MRN: [id number]  CC: Urinary Tract Infection (Burning when urinating, continued)   HPI Christian Kennedy presents for almost constant bad burning pain and dysuria 10-15 times a day;the pain is severe and is torturing the patient.  Nothing by prescription, including stronger pain pills help.  The antibiotics bring no relief. BC powder helps-he has too take 2 packs bid - tid to relieve symptoms; baking soda helps as well Can't sleep due to pain On Augmentin  now for presumed UTI He has been seeing his urologist regular   Outpatient Medications Prior to Visit  Medication Sig Dispense Refill   amoxicillin -clavulanate (AUGMENTIN ) 875-125 MG tablet Take 1 tablet by mouth 2 (two) times daily. 28 tablet 0   Aspirin-Acetaminophen -Caffeine (GOODYS EXTRA STRENGTH) 520-260-32.5 MG PACK Take 1-2 Packages by mouth every 6 (six) hours as needed (Pain).     b complex vitamins capsule Take 1 capsule by mouth daily.     BLACK CURRANT SEED OIL PO Take 1 capsule by mouth daily.     Cholecalciferol (VITAMIN D3) 50 MCG (2000 UT) CAPS Take 1 capsule (2,000 Units total) by mouth daily. 100 capsule 3   lidocaine  (XYLOCAINE ) 1 % (with preservative) injection Use 1-2 ML as diluent  for 1G Rocephin  IM injection daily 10 mL 0   SYRINGE-NEEDLE, DISP, 3 ML (BD ECLIPSE SYRINGE/NEEDLE) 23G X 1 3 ML MISC Use every day for IM injections (Rocephin ) 50 each 0   traMADol  (ULTRAM ) 50 MG tablet Take 1 tablet (50 mg total) by mouth every 8 (eight) hours as needed. 90 tablet 3   HYDROmorphone  (DILAUDID ) 4 MG tablet Take 1 tablet (4 mg total) by mouth every 6 (six) hours as needed for severe pain (pain score 7-10). 20 tablet 0   RABEprazole  (ACIPHEX ) 20 MG tablet Take 1 tablet (20 mg total) by mouth 2 (two) times daily. 180 tablet 3   tamsulosin (FLOMAX) 0.4 MG CAPS capsule Take 1 capsule (0.4 mg total) by mouth daily after supper. 30 capsule 11   No  facility-administered medications prior to visit.    ROS: Review of Systems  Constitutional:  Positive for fatigue. Negative for appetite change, fever and unexpected weight change.  HENT:  Negative for congestion, nosebleeds, sneezing, sore throat and trouble swallowing.   Eyes:  Negative for itching and visual disturbance.  Respiratory:  Negative for cough.   Cardiovascular:  Negative for chest pain, palpitations and leg swelling.  Gastrointestinal:  Negative for abdominal distention, blood in stool, diarrhea and nausea.  Genitourinary:  Negative for frequency and hematuria.  Musculoskeletal:  Negative for back pain, gait problem, joint swelling and neck pain.  Skin:  Negative for rash.  Neurological:  Negative for dizziness, tremors, speech difficulty and weakness.  Psychiatric/Behavioral:  Negative for agitation, dysphoric mood and sleep disturbance. The patient is not nervous/anxious.     Objective:  BP 126/72   Pulse 96   Temp 98.2 F (36.8 C)   Ht 6' 3 (1.905 m)   Wt 260 lb (117.9 kg)   SpO2 98%   BMI 32.50 kg/m   BP Readings from Last 3 Encounters:  07/21/24 126/72  07/14/24 134/76  06/28/24 126/74    Wt Readings from Last 3 Encounters:  07/21/24 260 lb (117.9 kg)  07/14/24 255 lb 1.2 oz (115.7 kg)  05/23/24 255 lb (115.7 kg)    Physical Exam Constitutional:      General: He  is not in acute distress.    Appearance: Normal appearance. He is well-developed. He is not toxic-appearing.     Comments: NAD  Eyes:     Conjunctiva/sclera: Conjunctivae normal.     Pupils: Pupils are equal, round, and reactive to light.  Neck:     Thyroid : No thyromegaly.     Vascular: No JVD.  Cardiovascular:     Rate and Rhythm: Normal rate and regular rhythm.     Heart sounds: Normal heart sounds. No murmur heard.    No friction rub. No gallop.  Pulmonary:     Effort: Pulmonary effort is normal. No respiratory distress.     Breath sounds: Normal breath sounds. No wheezing or  rales.  Chest:     Chest wall: No tenderness.  Abdominal:     General: Bowel sounds are normal. There is no distension.     Palpations: Abdomen is soft. There is no mass.     Tenderness: There is no abdominal tenderness. There is no guarding or rebound.  Musculoskeletal:        General: No tenderness. Normal range of motion.     Cervical back: Normal range of motion.  Lymphadenopathy:     Cervical: No cervical adenopathy.  Skin:    General: Skin is warm and dry.     Findings: No rash.  Neurological:     Mental Status: He is alert and oriented to person, place, and time.     Cranial Nerves: No cranial nerve deficit.     Motor: No abnormal muscle tone.     Coordination: Coordination normal.     Gait: Gait normal.     Deep Tendon Reflexes: Reflexes are normal and symmetric.  Psychiatric:        Behavior: Behavior normal.        Thought Content: Thought content normal.        Judgment: Judgment normal.   Abdomen nontender LS spine without pain Urostomy present  Lab Results  Component Value Date   WBC 10.2 07/14/2024   HGB 14.2 07/14/2024   HCT 42.2 07/14/2024   PLT 195 07/14/2024   GLUCOSE 102 (H) 07/14/2024   CHOL 214 (H) 12/29/2023   TRIG 258.0 (H) 12/29/2023   HDL 43.10 12/29/2023   LDLDIRECT 132.0 10/08/2021   LDLCALC 119 (H) 12/29/2023   ALT 13 07/14/2024   AST 14 (L) 07/14/2024   NA 138 07/14/2024   K 4.0 07/14/2024   CL 104 07/14/2024   CREATININE 1.05 07/14/2024   BUN 26 (H) 07/14/2024   CO2 24 07/14/2024   TSH 0.83 12/29/2023   PSA 1.18 10/08/2021   INR 1.1 05/05/2024   HGBA1C 5.4 05/05/2024    No results found.  Assessment & Plan:   Problem List Items Addressed This Visit     Dysuria    Chronic almost constant bad burning urinary bladder pain and dysuria 10-15 times a day;the pain is severe and is torturing the patient.  Nothing by prescription, including stronger pain pills help.  The antibiotics bring no relief. BC powder helps-he has too take  2 packs bid - tid to relieve symptoms; baking soda helps as well Can't sleep due to pain.  Overactive/pain bladder symptoms. On Augmentin  now for presumed UTI He has been seeing his urologist regular Will try Ditropan  empirically 5 mg 3 times a day as needed.  Will try Toradol  10 mg 3 times a day as needed; will try Fioricet with codeine as needed Risks/benefits discussed Follow-up  with urology      GERD (gastroesophageal reflux disease)   On rabeprazole .  Other PPIs do not help      Relevant Medications   RABEprazole  (ACIPHEX ) 20 MG tablet   Insomnia   Will treat bladder pain      Painful bladder spasm - Primary    Chronic almost constant bad burning urinary bladder pain and dysuria 10-15 times a day;the pain is severe and is torturing the patient.  Nothing by prescription, including stronger pain pills help.  The antibiotics bring no relief. BC powder helps-he has too take 2 packs bid - tid to relieve symptoms; baking soda helps as well Can't sleep due to pain.  Overactive/pain bladder symptoms. On Augmentin  now for presumed UTI He has been seeing his urologist regular Will try Ditropan  empirically 5 mg 3 times a day as needed.  Will try Toradol  10 mg 3 times a day as needed; will try Fioricet with codeine as needed Risks/benefits discussed Follow-up with urology         Meds ordered this encounter  Medications   oxybutynin  (DITROPAN ) 5 MG tablet    Sig: Take 1 tablet (5 mg total) by mouth 3 (three) times daily.    Dispense:  90 tablet    Refill:  3   RABEprazole  (ACIPHEX ) 20 MG tablet    Sig: Take 1 tablet (20 mg total) by mouth 2 (two) times daily.    Dispense:  180 tablet    Refill:  3    Protonix  causes muscle pain, Nexium and omeprazole do not help   ketorolac  (TORADOL ) 10 MG tablet    Sig: Take 1 tablet (10 mg total) by mouth every 8 (eight) hours as needed.    Dispense:  60 tablet    Refill:  1    Urinary pain   butalbital-apap-caffeine-codeine (FIORICET WITH  CODEINE) 50-325-40-30 MG capsule    Sig: Take 1-2 capsules by mouth every 6 (six) hours as needed for headache or other (pain).    Dispense:  40 capsule    Refill:  1      Follow-up: Return in about 6 weeks (around 09/01/2024) for a follow-up visit.  Marolyn Noel, MD

## 2024-07-24 DIAGNOSIS — R3989 Other symptoms and signs involving the genitourinary system: Secondary | ICD-10-CM | POA: Insufficient documentation

## 2024-07-24 NOTE — Assessment & Plan Note (Signed)
 Will treat bladder pain

## 2024-07-24 NOTE — Assessment & Plan Note (Signed)
 Chronic almost constant bad burning urinary bladder pain and dysuria 10-15 times a day;the pain is severe and is torturing the patient.  Nothing by prescription, including stronger pain pills help.  The antibiotics bring no relief. BC powder helps-he has too take 2 packs bid - tid to relieve symptoms; baking soda helps as well Can't sleep due to pain.  Overactive/pain bladder symptoms. On Augmentin  now for presumed UTI He has been seeing his urologist regular Will try Ditropan  empirically 5 mg 3 times a day as needed.  Will try Toradol  10 mg 3 times a day as needed; will try Fioricet with codeine as needed Risks/benefits discussed Follow-up with urology

## 2024-07-24 NOTE — Assessment & Plan Note (Signed)
 On rabeprazole .  Other PPIs do not help

## 2024-07-29 ENCOUNTER — Encounter: Payer: Self-pay | Admitting: Internal Medicine

## 2024-08-01 ENCOUNTER — Other Ambulatory Visit (HOSPITAL_COMMUNITY): Payer: Self-pay | Admitting: Interventional Radiology

## 2024-08-01 ENCOUNTER — Ambulatory Visit (HOSPITAL_COMMUNITY)
Admission: RE | Admit: 2024-08-01 | Discharge: 2024-08-01 | Disposition: A | Discharge status: Home / Self Care | Payer: Self-pay | Source: Ambulatory Visit | Attending: Diagnostic Radiology | Admitting: Diagnostic Radiology

## 2024-08-01 DIAGNOSIS — C679 Malignant neoplasm of bladder, unspecified: Secondary | ICD-10-CM

## 2024-08-01 DIAGNOSIS — N135 Crossing vessel and stricture of ureter without hydronephrosis: Secondary | ICD-10-CM | POA: Insufficient documentation

## 2024-08-01 HISTORY — PX: IR NEPHROSTOMY EXCHANGE RIGHT: IMG6070

## 2024-08-01 MED ORDER — IOHEXOL 300 MG/ML  SOLN
50.0000 mL | Freq: Once | INTRAMUSCULAR | Status: AC | PRN
  Administered 2024-08-01: 10 mL

## 2024-08-01 MED ORDER — LIDOCAINE HCL 1 % IJ SOLN
INTRAMUSCULAR | Status: AC
  Filled 2024-08-01: qty 20

## 2024-08-05 ENCOUNTER — Other Ambulatory Visit: Payer: Self-pay

## 2024-08-05 ENCOUNTER — Emergency Department (HOSPITAL_COMMUNITY)
Admission: EM | Admit: 2024-08-05 | Discharge: 2024-08-05 | Disposition: A | Discharge status: Home / Self Care | Payer: Self-pay

## 2024-08-05 ENCOUNTER — Encounter (HOSPITAL_COMMUNITY): Payer: Self-pay

## 2024-08-05 DIAGNOSIS — Z7982 Long term (current) use of aspirin: Secondary | ICD-10-CM | POA: Insufficient documentation

## 2024-08-05 DIAGNOSIS — R319 Hematuria, unspecified: Secondary | ICD-10-CM | POA: Insufficient documentation

## 2024-08-05 LAB — CBC WITH DIFFERENTIAL/PLATELET
Abs Immature Granulocytes: 0.02 K/uL (ref 0.00–0.07)
Basophils Absolute: 0.1 K/uL (ref 0.0–0.1)
Basophils Relative: 1 %
Eosinophils Absolute: 0.3 K/uL (ref 0.0–0.5)
Eosinophils Relative: 4 %
HCT: 50 % (ref 39.0–52.0)
Hemoglobin: 16.5 g/dL (ref 13.0–17.0)
Immature Granulocytes: 0 %
Lymphocytes Relative: 25 %
Lymphs Abs: 2.1 K/uL (ref 0.7–4.0)
MCH: 30.4 pg (ref 26.0–34.0)
MCHC: 33 g/dL (ref 30.0–36.0)
MCV: 92.3 fL (ref 80.0–100.0)
Monocytes Absolute: 0.6 K/uL (ref 0.1–1.0)
Monocytes Relative: 7 %
Neutro Abs: 5.2 K/uL (ref 1.7–7.7)
Neutrophils Relative %: 63 %
Platelets: 209 K/uL (ref 150–400)
RBC: 5.42 MIL/uL (ref 4.22–5.81)
RDW: 13.7 % (ref 11.5–15.5)
WBC: 8.2 K/uL (ref 4.0–10.5)
nRBC: 0 % (ref 0.0–0.2)

## 2024-08-05 LAB — BASIC METABOLIC PANEL WITH GFR
Anion gap: 9 (ref 5–15)
BUN: 14 mg/dL (ref 6–20)
CO2: 27 mmol/L (ref 22–32)
Calcium: 9.9 mg/dL (ref 8.9–10.3)
Chloride: 102 mmol/L (ref 98–111)
Creatinine, Ser: 1.08 mg/dL (ref 0.61–1.24)
GFR, Estimated: >60 mL/min (ref >60)
Glucose, Bld: 96 mg/dL (ref 70–99)
Potassium: 4.5 mmol/L (ref 3.5–5.1)
Sodium: 139 mmol/L (ref 135–145)

## 2024-08-05 LAB — URINALYSIS, W/ REFLEX TO CULTURE (INFECTION SUSPECTED)
Bacteria, UA: NONE SEEN
Bilirubin Urine: NEGATIVE
Glucose, UA: NEGATIVE mg/dL
Ketones, ur: NEGATIVE mg/dL
Leukocytes,Ua: NEGATIVE
Nitrite: NEGATIVE
Protein, ur: 100 mg/dL — AB
RBC / HPF: >50 RBC/hpf (ref 0–5)
Specific Gravity, Urine: 1.006 (ref 1.005–1.030)
WBC, UA: >50 WBC/hpf (ref 0–5)
pH: 6 (ref 5.0–8.0)

## 2024-08-05 NOTE — ED Provider Notes (Signed)
 [place] EMERGENCY DEPARTMENT AT [facility] Provider Note   CSN: [id number] Arrival date & time: 08/05/24  9242     Patient presents with: Hematuria   Christian Kennedy is a 58 y.o. male.   58 year old male presents for evaluation of hematuria.  States has had this happen before as he has a bladder tumor and he follows up with his urologist.  States he had some trouble urinating and was passing clots.  States he had a few intermittent episodes of urinary retention but those have resolved and he is now just peeing blood.  Denies any other symptoms or concerns.   Hematuria Pertinent negatives include no chest pain, no abdominal pain and no shortness of breath.       Prior to Admission medications   Medication Sig Start Date End Date Taking? Authorizing Provider  amoxicillin -clavulanate (AUGMENTIN ) 875-125 MG tablet Take 1 tablet by mouth 2 (two) times daily. 07/08/24   Plotnikov, Aleksei V, MD  Aspirin-Acetaminophen -Caffeine (GOODYS EXTRA STRENGTH) 520-260-32.5 MG PACK Take 1-2 Packages by mouth every 6 (six) hours as needed (Pain).    [provider]  b complex vitamins capsule Take 1 capsule by mouth daily.    [provider]  BLACK CURRANT SEED OIL PO Take 1 capsule by mouth daily.    [provider]  butalbital-apap-caffeine-codeine (FIORICET WITH CODEINE) 50-325-40-30 MG capsule Take 1-2 capsules by mouth every 6 (six) hours as needed for headache or other (pain). 07/21/24   Plotnikov, Aleksei V, MD  Cholecalciferol (VITAMIN D3) 50 MCG (2000 UT) CAPS Take 1 capsule (2,000 Units total) by mouth daily. 11/17/22   Plotnikov, Aleksei V, MD  ketorolac  (TORADOL ) 10 MG tablet Take 1 tablet (10 mg total) by mouth every 8 (eight) hours as needed. 07/21/24   Plotnikov, [name], MD  lidocaine  (XYLOCAINE ) 1 % (with preservative) injection Use 1-2 ML as diluent  for 1G Rocephin  IM injection daily 05/25/24   Plotnikov, Aleksei V, MD  oxybutynin  (DITROPAN ) 5  MG tablet Take 1 tablet (5 mg total) by mouth 3 (three) times daily. 07/21/24 07/21/25  Plotnikov, [name], MD  RABEprazole  (ACIPHEX ) 20 MG tablet Take 1 tablet (20 mg total) by mouth 2 (two) times daily. 07/21/24   Plotnikov, Aleksei V, MD  SYRINGE-NEEDLE, DISP, 3 ML (BD ECLIPSE SYRINGE/NEEDLE) 23G X 1 3 ML MISC Use every day for IM injections (Rocephin ) 05/25/24   Plotnikov, [name], MD  traMADol  (ULTRAM ) 50 MG tablet Take 1 tablet (50 mg total) by mouth every 8 (eight) hours as needed. 05/09/24   Plotnikov, [name], MD    Allergies: Macrobid  [nitrofurantoin ]    Review of Systems  Constitutional:  Negative for chills and fever.  HENT:  Negative for ear pain and sore throat.   Eyes:  Negative for pain and visual disturbance.  Respiratory:  Negative for cough and shortness of breath.   Cardiovascular:  Negative for chest pain and palpitations.  Gastrointestinal:  Negative for abdominal pain and vomiting.  Genitourinary:  Positive for hematuria. Negative for dysuria.  Musculoskeletal:  Negative for arthralgias and back pain.  Skin:  Negative for color change and rash.  Neurological:  Negative for seizures and syncope.  All other systems reviewed and are negative.   Updated Vital Signs BP 130/80   Pulse 84   Temp 98 F (36.7 C)   Resp 17   Ht 6' 3 (1.905 m)   Wt 117.9 kg   SpO2 96%   BMI 32.50 kg/m  Physical Exam Vitals and nursing note reviewed.  Constitutional:      General: He is not in acute distress.    Appearance: Normal appearance. He is well-developed. He is not ill-appearing.  HENT:     Head: Normocephalic and atraumatic.  Eyes:     Conjunctiva/sclera: Conjunctivae normal.  Cardiovascular:     Rate and Rhythm: Normal rate and regular rhythm.     Heart sounds: No murmur heard. Pulmonary:     Effort: Pulmonary effort is normal. No respiratory distress.     Breath sounds: Normal breath sounds.  Abdominal:     Palpations: Abdomen is soft.     Tenderness:  There is no abdominal tenderness.  Musculoskeletal:        General: No swelling.     Cervical back: Neck supple.  Skin:    General: Skin is warm and dry.     Capillary Refill: Capillary refill takes less than 2 seconds.  Neurological:     Mental Status: He is alert.  Psychiatric:        Mood and Affect: Mood normal.     (all labs ordered are listed, but only abnormal results are displayed) Labs Reviewed  URINALYSIS, W/ REFLEX TO CULTURE (INFECTION SUSPECTED) - Abnormal; Notable for the following components:      Result Value   Color, Urine PINK (*)    APPearance HAZY (*)    Hgb urine dipstick LARGE (*)    Protein, ur 100 (*)    All other components within normal limits  URINE CULTURE  BASIC METABOLIC PANEL WITH GFR  CBC WITH DIFFERENTIAL/PLATELET    EKG: None  Radiology: No results found.   Procedures   Medications Ordered in the ED - No data to display                                  Medical Decision Making Patient here for hematuria.  He states he had some difficulties with urinary retention earlier but passed some blood clots and that is resolved.  CBC and BMP fairly unremarkable and he has no evidence of infection.  Does follow-up with urology regularly.  I offered him a catheter but he declined and would like to try to follow-up with urology next week.  Will plan to call the office Monday.  Advised to return for any new or worsening symptoms.  He feels comfortable to plan to be discharged home.  He has been able to void without difficulty here.  Problems Addressed: Hematuria, unspecified type: acute illness or injury  Amount and/or Complexity of Data Reviewed External Data Reviewed: notes.    Details: Prior outpatient records reviewed and patient follows with urology, has a history of bladder tumor and right sided nephrostomy tube Labs: ordered. Decision-making details documented in ED Course.    Details: Ordered and reviewed by me.  CBC BMP unremarkable  patient has some blood in his urine but no evidence of infection  Risk OTC drugs. Prescription drug management.     Final diagnoses:  Hematuria, unspecified type    ED Discharge Orders     None          Gennaro Duwaine CROME, DO 08/05/24 1109

## 2024-08-05 NOTE — Discharge Instructions (Signed)
 It is important that you call your urologist on Monday to make a follow-up appointment.  Return to the ER for any new or worsening symptoms.

## 2024-08-05 NOTE — ED Triage Notes (Signed)
 Pt states that he has had clots of blood with urination since yesterday. Pt states that he has had blood in his urine before, but not clots. Pt states he does have a tumor that he has had surgery on in the past that has caused the blood.

## 2024-08-06 ENCOUNTER — Emergency Department (HOSPITAL_COMMUNITY): Payer: Self-pay

## 2024-08-06 ENCOUNTER — Emergency Department (HOSPITAL_COMMUNITY)
Admission: EM | Admit: 2024-08-06 | Discharge: 2024-08-07 | Disposition: A | Discharge status: Home / Self Care | Payer: Self-pay | Attending: Emergency Medicine | Admitting: Emergency Medicine

## 2024-08-06 ENCOUNTER — Other Ambulatory Visit: Payer: Self-pay

## 2024-08-06 DIAGNOSIS — R1030 Lower abdominal pain, unspecified: Secondary | ICD-10-CM | POA: Insufficient documentation

## 2024-08-06 DIAGNOSIS — R310 Gross hematuria: Secondary | ICD-10-CM | POA: Insufficient documentation

## 2024-08-06 DIAGNOSIS — R339 Retention of urine, unspecified: Secondary | ICD-10-CM | POA: Insufficient documentation

## 2024-08-06 LAB — CBC WITH DIFFERENTIAL/PLATELET
Abs Immature Granulocytes: 0.05 K/uL (ref 0.00–0.07)
Basophils Absolute: 0.1 K/uL (ref 0.0–0.1)
Basophils Relative: 1 %
Eosinophils Absolute: 0.3 K/uL (ref 0.0–0.5)
Eosinophils Relative: 2 %
HCT: 47.6 % (ref 39.0–52.0)
Hemoglobin: 15.7 g/dL (ref 13.0–17.0)
Immature Granulocytes: 0 %
Lymphocytes Relative: 23 %
Lymphs Abs: 2.9 K/uL (ref 0.7–4.0)
MCH: 30.4 pg (ref 26.0–34.0)
MCHC: 33 g/dL (ref 30.0–36.0)
MCV: 92.2 fL (ref 80.0–100.0)
Monocytes Absolute: 0.8 K/uL (ref 0.1–1.0)
Monocytes Relative: 6 %
Neutro Abs: 8.9 K/uL — ABNORMAL HIGH (ref 1.7–7.7)
Neutrophils Relative %: 68 %
Platelets: 229 K/uL (ref 150–400)
RBC: 5.16 MIL/uL (ref 4.22–5.81)
RDW: 13.5 % (ref 11.5–15.5)
WBC: 13 K/uL — ABNORMAL HIGH (ref 4.0–10.5)
nRBC: 0 % (ref 0.0–0.2)

## 2024-08-06 LAB — URINE CULTURE: Culture: NO GROWTH

## 2024-08-06 LAB — COMPREHENSIVE METABOLIC PANEL WITH GFR
ALT: 11 U/L (ref 0–44)
AST: 13 U/L — ABNORMAL LOW (ref 15–41)
Albumin: 4.7 g/dL (ref 3.5–5.0)
Alkaline Phosphatase: 66 U/L (ref 38–126)
Anion gap: 12 (ref 5–15)
BUN: 23 mg/dL — ABNORMAL HIGH (ref 6–20)
CO2: 22 mmol/L (ref 22–32)
Calcium: 9.4 mg/dL (ref 8.9–10.3)
Chloride: 105 mmol/L (ref 98–111)
Creatinine, Ser: 1.39 mg/dL — ABNORMAL HIGH (ref 0.61–1.24)
GFR, Estimated: 58 mL/min — ABNORMAL LOW (ref >60)
Glucose, Bld: 98 mg/dL (ref 70–99)
Potassium: 4.3 mmol/L (ref 3.5–5.1)
Sodium: 139 mmol/L (ref 135–145)
Total Bilirubin: 0.3 mg/dL (ref 0.0–1.2)
Total Protein: 7.4 g/dL (ref 6.5–8.1)

## 2024-08-06 LAB — PROTIME-INR
INR: 0.9 (ref 0.8–1.2)
Prothrombin Time: 12.8 s (ref 11.4–15.2)

## 2024-08-06 NOTE — ED Triage Notes (Addendum)
 Pt states unable to urinate since this morning was seen here for same yesterday but couldn't not wait til appt on the 19th to follow up. When trying to pee blood comes from penis. Fever today at home 102.

## 2024-08-06 NOTE — ED Provider Notes (Signed)
 [place] EMERGENCY DEPARTMENT AT [facility] Provider Note   CSN: [id number] Arrival date & time: 08/06/24  2055     Patient presents with: Urinary Retention   Christian Kennedy is a 58 y.o. male.  {Add pertinent medical, surgical, social history, OB history to YEP:67052} HPI Patient presents for concern of urine retention.  Medical history includes migraines, GERD, urothelial cancer, anxiety.  He was seen in the ED yesterday for hematuria.  He declined catheter placement with plan for urology follow-up next week.  Since prior visit, he has a difficulty urinating.  He was unable to urinate throughout the day today.  He did develop lower abdominal discomfort.  At around 3 PM, he had sharp abdominal pain and a fever of 102 degrees.  He reports that fever broke within 30 minutes.  He has not felt feverish since that time.  While in the ED, he was able to urinate approximately 200 cc of urine.  He states that his hematuria has improved and his urine is now pink in color.  He does have ongoing lower abdominal discomfort.    Prior to Admission medications   Medication Sig Start Date End Date Taking? Authorizing Provider  amoxicillin -clavulanate (AUGMENTIN ) 875-125 MG tablet Take 1 tablet by mouth 2 (two) times daily. 07/08/24   Plotnikov, Aleksei V, MD  Aspirin-Acetaminophen -Caffeine (GOODYS EXTRA STRENGTH) 520-260-32.5 MG PACK Take 1-2 Packages by mouth every 6 (six) hours as needed (Pain).    [provider]  b complex vitamins capsule Take 1 capsule by mouth daily.    [provider]  BLACK CURRANT SEED OIL PO Take 1 capsule by mouth daily.    [provider]  butalbital-apap-caffeine-codeine (FIORICET WITH CODEINE) 50-325-40-30 MG capsule Take 1-2 capsules by mouth every 6 (six) hours as needed for headache or other (pain). 07/21/24   Plotnikov, Aleksei V, MD  Cholecalciferol (VITAMIN D3) 50 MCG (2000 UT) CAPS Take 1 capsule (2,000 Units total) by mouth  daily. 11/17/22   Plotnikov, Aleksei V, MD  ketorolac  (TORADOL ) 10 MG tablet Take 1 tablet (10 mg total) by mouth every 8 (eight) hours as needed. 07/21/24   Plotnikov, [name], MD  lidocaine  (XYLOCAINE ) 1 % (with preservative) injection Use 1-2 ML as diluent  for 1G Rocephin  IM injection daily 05/25/24   Plotnikov, Aleksei V, MD  oxybutynin  (DITROPAN ) 5 MG tablet Take 1 tablet (5 mg total) by mouth 3 (three) times daily. 07/21/24 07/21/25  Plotnikov, [name], MD  RABEprazole  (ACIPHEX ) 20 MG tablet Take 1 tablet (20 mg total) by mouth 2 (two) times daily. 07/21/24   Plotnikov, Aleksei V, MD  SYRINGE-NEEDLE, DISP, 3 ML (BD ECLIPSE SYRINGE/NEEDLE) 23G X 1 3 ML MISC Use every day for IM injections (Rocephin ) 05/25/24   Plotnikov, [name], MD  traMADol  (ULTRAM ) 50 MG tablet Take 1 tablet (50 mg total) by mouth every 8 (eight) hours as needed. 05/09/24   Plotnikov, [name], MD    Allergies: Macrobid  [nitrofurantoin ]    Review of Systems  Constitutional:  Positive for fever.  Gastrointestinal:  Positive for abdominal pain.  Genitourinary:  Positive for difficulty urinating.  All other systems reviewed and are negative.   Updated Vital Signs BP 126/74   Pulse 95   Temp 97.8 F (36.6 C) (Oral)   Resp 20   SpO2 93%   Physical Exam Vitals and nursing note reviewed.  Constitutional:      General: He is not in acute distress.    Appearance: Normal  appearance. He is well-developed. He is not ill-appearing, toxic-appearing or diaphoretic.  HENT:     Head: Normocephalic and atraumatic.     Right Ear: External ear normal.     Left Ear: External ear normal.     Nose: Nose normal.     Mouth/Throat:     Mouth: Mucous membranes are moist.  Eyes:     Extraocular Movements: Extraocular movements intact.     Conjunctiva/sclera: Conjunctivae normal.  Cardiovascular:     Rate and Rhythm: Normal rate and regular rhythm.  Pulmonary:     Effort: Pulmonary effort is normal. No respiratory  distress.  Abdominal:     General: There is no distension.     Palpations: Abdomen is soft.     Tenderness: There is abdominal tenderness. There is no guarding or rebound.  Musculoskeletal:        General: No swelling. Normal range of motion.     Cervical back: Normal range of motion and neck supple.  Skin:    General: Skin is warm and dry.     Coloration: Skin is not jaundiced or pale.  Neurological:     General: No focal deficit present.     Mental Status: He is alert and oriented to person, place, and time.  Psychiatric:        Mood and Affect: Mood normal.        Behavior: Behavior normal.     (all labs ordered are listed, but only abnormal results are displayed) Labs Reviewed  CBC WITH DIFFERENTIAL/PLATELET - Abnormal; Notable for the following components:      Result Value   WBC 13.0 (*)    Neutro Abs 8.9 (*)    All other components within normal limits  COMPREHENSIVE METABOLIC PANEL WITH GFR  PROTIME-INR  URINALYSIS, W/ REFLEX TO CULTURE (INFECTION SUSPECTED)    EKG: None  Radiology: No results found.  {Document cardiac monitor, telemetry assessment procedure when appropriate:32947} Procedures   Medications Ordered in the ED - No data to display    {Click here for ABCD2, HEART and other calculators REFRESH Note before signing:1}                              Medical Decision Making  This patient presents to the ED for concern of ***, this involves an extensive number of treatment options, and is a complaint that carries with it a high risk of complications and morbidity.  The differential diagnosis includes ***   Co morbidities / Chronic conditions that complicate the patient evaluation  ***   Additional history obtained:  Additional history obtained from EMR External records from outside source obtained and reviewed including ***   Lab Tests:  I Ordered, and personally interpreted labs.  The pertinent results include:  ***   Imaging Studies  ordered:  I ordered imaging studies including ***  I independently visualized and interpreted imaging which showed *** I agree with the radiologist interpretation   Cardiac Monitoring: / EKG:  The patient was maintained on a cardiac monitor.  I personally viewed and interpreted the cardiac monitored which showed an underlying rhythm of: ***   Problem List / ED Course / Critical interventions / Medication management  Patient presenting for urine retention.  Vital signs on arrival are normal.  Patient is well-appearing on exam.  Although he was unable to urinate throughout the day today, he did have a 200 cc urine output this evening.  On subsequent bladder scan, patient has a remaining 450 cc of urine in his bladder.  This is consistent with urine retention.  Patient initially declines Foley catheter.  Given what he describes as sharp abdominal pains today and transient fever, workup was initiated.*** I ordered medication including ***   Reevaluation of the patient after these medicines showed that the patient *** I have reviewed the patients home medicines and have made adjustments as needed   Consultations Obtained:  I requested consultation with the ***,  and discussed lab and imaging findings as well as pertinent plan - they recommend: ***   Social Determinants of Health:  ***   Test / Admission - Considered:  ***   {Document critical care time when appropriate  Document review of labs and clinical decision tools ie CHADS2VASC2, etc  Document your independent review of radiology images and any outside records  Document your discussion with family members, caretakers and with consultants  Document social determinants of health affecting pt's care  Document your decision making why or why not admission, treatments were needed:32947:::1}   Final diagnoses:  None    ED Discharge Orders     None

## 2024-08-06 NOTE — ED Notes (Signed)
 Pt said he used the restroom a little bit ago. He was able to pee a small amount. Pt was hurting down in lower abdomen and around the right side. But states he is feeling a bit better now.

## 2024-08-07 ENCOUNTER — Other Ambulatory Visit: Payer: Self-pay | Admitting: Internal Medicine

## 2024-08-07 ENCOUNTER — Telehealth: Payer: Self-pay | Admitting: Urology

## 2024-08-07 LAB — URINALYSIS, W/ REFLEX TO CULTURE (INFECTION SUSPECTED)
Bacteria, UA: NONE SEEN
Bilirubin Urine: NEGATIVE
Glucose, UA: NEGATIVE mg/dL
Ketones, ur: NEGATIVE mg/dL
Leukocytes,Ua: NEGATIVE
Nitrite: NEGATIVE
Protein, ur: 100 mg/dL — AB
Specific Gravity, Urine: 1.018 (ref 1.005–1.030)
pH: 6 (ref 5.0–8.0)

## 2024-08-07 MED ORDER — LACTATED RINGERS IV BOLUS: 1000.0000 mL | Freq: Once | INTRAVENOUS | Status: DC

## 2024-08-07 MED ORDER — CEPHALEXIN 500 MG PO CAPS: 500.0000 mg | ORAL_CAPSULE | Freq: Four times a day (QID) | ORAL | 0 refills | Status: DC

## 2024-08-07 MED ORDER — SODIUM CHLORIDE 0.9 % IV SOLN
1.0000 g | Freq: Once | INTRAVENOUS | Status: AC
  Administered 2024-08-07: 1 g via INTRAVENOUS
  Filled 2024-08-07: qty 10

## 2024-08-07 MED ORDER — SODIUM CHLORIDE 0.9 % IV BOLUS
1000.0000 mL | Freq: Once | INTRAVENOUS | Status: AC
  Administered 2024-08-07: 1000 mL via INTRAVENOUS

## 2024-08-07 NOTE — Telephone Encounter (Signed)
 Return call to pt and he state's that he had the inability to voided and had a cath placed in the hospital 11/09. Pt state he had surgery in February of this year and want to have the same surgery again because he is going through the same issue. Pt is made aware a message will be sent to the MD, McKenzie

## 2024-08-07 NOTE — Telephone Encounter (Signed)
 SABRA

## 2024-08-07 NOTE — Discharge Instructions (Addendum)
 Follow-up with Dr. Sherrilee as soon as possible.  Leave Foley catheter in place until you see him.  Drink plenty of fluids to stay hydrated.  A prescription for antibiotics was sent to your pharmacy.  Take this until urine cultures result in 1 to 2 days.  If culture does not show infection, it can be discontinued.

## 2024-08-08 ENCOUNTER — Ambulatory Visit: Payer: Self-pay

## 2024-08-08 ENCOUNTER — Telehealth: Payer: Self-pay | Admitting: Internal Medicine

## 2024-08-08 LAB — URINE CULTURE: Culture: NO GROWTH

## 2024-08-08 NOTE — Telephone Encounter (Signed)
 FYI Only or Action Required?: Action required by provider: clinical question for provider and update on patient condition.  Patient was last seen in primary care on 07/21/2024 by Plotnikov, [name], MD.  Called Nurse Triage reporting Bleeding/Bruising.    Triage Disposition: Call PCP Now  Patient/caregiver understands and will follow disposition?: Yes        Copied from CRM [id number]. Topic: Clinical - Red Word Triage >> Aug 08, 2024  9:35 AM Harlene ORN wrote: Red Word that prompted transfer to Nurse Triage: bleeding out Reason for Disposition  [1] Caller requests to speak ONLY to PCP AND [2] URGENT question  Answer Assessment - Initial Assessment Questions Pt denies feeling dizzy or feeling like he will faint. States he has some discomfort with the catheter and he is  bleeding out. He states he attempted to call urology but did not get in contact with anyone. Pt refused to be seen today for symptoms. Requesting an appointment with his PCP today. Pt advised to go to ED for worsening symptoms. Pt also requesting a call from his PCP.    1. COLOR of URINE: Describe the color of the urine.  (e.g., tea-colored, pink, red, bloody) Do you have blood clots in your urine? (e.g., none, pea, grape, small coin)     Red and looks like clots  2. ONSET: When did the bleeding start?      Ongoing  3. PAIN with URINATION: Is there any pain with passing your urine? If Yes, ask: How bad is the pain?  (Scale 1-10; or mild, moderate, severe)     Denies pain  4. FEVER: Do you have a fever? If Yes, ask: What is your temperature, how was it measured, and when did it start?     Denies  Protocols used: PCP Call - No Triage-A-AH

## 2024-08-08 NOTE — Telephone Encounter (Unsigned)
 Copied from CRM #[id number]. Topic: Clinical - Prescription Issue >> Aug 08, 2024 11:28 AM Ahlexyia S wrote: Reason for CRM: Medford from [facility] called in stating that they received a prescription for sodium chloride  flush (BD POSIFLUSH) 0.9 % SOLN injection at 3 millimeters. Medford stated the smallest that they can fill is 300 which is what its normally packaged as. Medford also stated they will dispense the whole box to pt if they need it. Pharmacy is needing an updated prescription.

## 2024-08-09 ENCOUNTER — Telehealth: Payer: Self-pay

## 2024-08-09 ENCOUNTER — Ambulatory Visit: Payer: Self-pay

## 2024-08-09 DIAGNOSIS — N3001 Acute cystitis with hematuria: Secondary | ICD-10-CM

## 2024-08-09 MED ORDER — AMBULATORY NON FORMULARY MEDICATION: 0 refills | Status: DC

## 2024-08-09 NOTE — Telephone Encounter (Signed)
 Return call to patient. Patient state's he is seeing hematuria in his cath. Pt state's he has been flushing his cath with 10 cc of sodium chloride  flush. Pt is advised to flush cath with 70 cc of sodium chloride  and toomey syringe will be up front for pick up

## 2024-08-09 NOTE — Telephone Encounter (Signed)
 FYI Only or Action Required?: FYI only for provider: ED advised.  Patient was last seen in primary care on 07/21/2024 by Plotnikov, [name], MD.  Called Nurse Triage reporting Hematuria.  Symptoms began several days ago.  Interventions attempted: Nothing.  Symptoms are: gradually worsening.  Triage Disposition: Go to ED Now (or PCP Triage)  Patient/caregiver understands and will follow disposition?: Yes Reason for Disposition  Patient sounds very sick or weak to the triager  Answer Assessment - Initial Assessment Questions Patient states getting dizzy. Patient states he called urologist and they stated he has an appointment on 11/19, patient states he cannot wait. Advised patient the office tried to reach him and he did not answer. Advised patient to go to ED for symptoms as symptoms has worsened since speaking to NT yesterday. Patient states wife can drive him  1. SYMPTOMS: What symptoms are you concerned about?     Hematuria, blood clots in catheter and urine and clots are coming out around the catheter as well  2. ONSET:  When did the symptoms start?     A week ago, since Saturday gotten worse  3. FEVER: Do you have a fever? If Yes, ask: What is the temperature, how was it measured, and when did it start?     Denies  4. ABDOMEN PAIN: Is there any abdomen pain? (Scale 1-10; or mild, moderate, severe)     5/10  5. URINE COLOR: What color is the urine?  Is there blood present in the urine? (e.g., clear, yellow, cloudy, tea-colored, blood streaks, bright red)     Dark red  6. OTHER SYMPTOMS: Are there any other symptoms? (e.g., abdomen swelling, back pain, bladder spasms, constipation, foul smelling urine, leaking of urine)      Bladder spasms, flank pain, lower abdominal pain  Protocols used: Urinary Catheter (e.g., Foley) Symptoms and Questions-A-AH  Copied from CRM #[id number]. Topic: Clinical - Red Word Triage >> Aug 09, 2024  1:17 PM Pinkey ORN  wrote: Red Word that prompted transfer to Nurse Triage: Dizziness + Bleeding Out

## 2024-08-09 NOTE — Telephone Encounter (Signed)
 2nd attempt to reach pt in regards to note from Triage nurse.

## 2024-08-09 NOTE — Addendum Note (Signed)
 Addended by: GRETTA MASTERS R on: 08/09/2024 02:36 PM   Modules accepted: Orders

## 2024-08-09 NOTE — Telephone Encounter (Signed)
 Tried to reach out to the pt... Pt phone went to VM .SABRASABRA However, pts vm is not set up at this time.

## 2024-08-10 MED ORDER — SODIUM CHLORIDE 0.9 % IJ SOLN: 3.0000 mL | INTRAMUSCULAR | 3 refills | Status: AC | PRN

## 2024-08-10 NOTE — Telephone Encounter (Signed)
 Okay to dispense according to Medford' suggestion.  Thanks

## 2024-08-10 NOTE — Telephone Encounter (Signed)
 Noted.  Christian Kennedy needs to follow-up with his urologist.  Thank you

## 2024-08-15 ENCOUNTER — Encounter: Payer: Self-pay | Admitting: Internal Medicine

## 2024-08-15 NOTE — Telephone Encounter (Signed)
 Patient was able to follow up with urology regarding this issue and has an appointment sent for them tomorrow

## 2024-08-15 NOTE — Telephone Encounter (Signed)
 Most recent addendum documented in the wrong note. Please disregard

## 2024-08-16 ENCOUNTER — Ambulatory Visit: Payer: Self-pay | Admitting: Urology

## 2024-08-16 VITALS — BP 124/73 | HR 90

## 2024-08-16 DIAGNOSIS — C672 Malignant neoplasm of lateral wall of bladder: Secondary | ICD-10-CM

## 2024-08-16 DIAGNOSIS — R339 Retention of urine, unspecified: Secondary | ICD-10-CM

## 2024-08-16 MED ORDER — CIPROFLOXACIN HCL 500 MG PO TABS
500.0000 mg | ORAL_TABLET | Freq: Once | ORAL | Status: AC
  Administered 2024-08-16: 500 mg via ORAL

## 2024-08-16 NOTE — Progress Notes (Signed)
 Fill and Pull Catheter Removal  Patient is present today for a catheter removal due to Malignant neoplasm of lateral wall of urinary bladder.  of sterile water  was instilled into the bladder when the patient felt the urge to urinate. 10ml of water  was then drained from the balloon.  A 16 FR foley cath was removed from the bladder no complications were noted .  Foley catheter intact and time of removal. Patient as then given some time to void on their own.  Patient can void  on their own after some time.  Patient tolerated well.  One oral prophylactic antibiotic given per MD orders  Performed by: Exie T. CMA  Follow up/ Additional notes: f/u as scheduled

## 2024-08-16 NOTE — Progress Notes (Signed)
 08/16/2024 9:47 AM   Christian Kennedy 1966-05-28 [id number]  Referring provider: Garald [name], MD [address] [place],  [place] [address]  Followup urinary retention   HPI: Christian Kennedy is a 58yo here for followup for urinary retention. He passed his voiding trial today. He has a known enlarging right lateral wall bladder tumor. He denies any recent gross hematuria. He denies any flank or pelvic pain. NO other complaints today   PMH: Past Medical History:  Diagnosis Date   Abscess    Right buttocks   Bladder tumor    Depression    Son passed 2014   Ear infection    Eating disorder    Frequent headaches    GERD (gastroesophageal reflux disease)    Helicobacter pylori ab+ 10/27/2017   Hepatitis    childhood, jaundice   Migraines    Urinary incontinence    Varicose vein of leg    Right leg   Vitamin D  deficiency     Surgical History: Past Surgical History:  Procedure Laterality Date   CYSTOSCOPY W/ RETROGRADES Bilateral 11/12/2022   Procedure: CYSTOSCOPY WITH RETROGRADE PYELOGRAM;  Surgeon: Sherrilee Belvie CROME, MD;  Location: [facility];  Service: Urology;  Laterality: Bilateral;   CYSTOSCOPY W/ URETERAL STENT PLACEMENT Right 11/19/2017   Procedure: CYSTOSCOPY WITH RETROGRADE PYELOGRAM/URETERAL STENT PLACEMENT;  Surgeon: Devere Lonni Righter, MD;  Location: [facility];  Service: Urology;  Laterality: Right;   CYSTOSCOPY WITH FULGERATION N/A 11/19/2017   Procedure: CYSTOSCOPY WITH FULGERATION AND CLOT EVACUATION;  Surgeon: Chauncey Redell Agent, MD;  Location: [facility];  Service: Urology;  Laterality: N/A;   IR NEPHROSTOMY EXCHANGE RIGHT  07/20/2022   IR NEPHROSTOMY EXCHANGE RIGHT  10/06/2022   IR NEPHROSTOMY EXCHANGE RIGHT  12/01/2022   IR NEPHROSTOMY EXCHANGE RIGHT  02/02/2023   IR NEPHROSTOMY EXCHANGE RIGHT  04/06/2023   IR NEPHROSTOMY EXCHANGE RIGHT  04/20/2023   IR NEPHROSTOMY EXCHANGE RIGHT  06/15/2023   IR NEPHROSTOMY EXCHANGE RIGHT  08/17/2023    IR NEPHROSTOMY EXCHANGE RIGHT  09/17/2023   IR NEPHROSTOMY EXCHANGE RIGHT  11/08/2023   IR NEPHROSTOMY EXCHANGE RIGHT  12/21/2023   IR NEPHROSTOMY EXCHANGE RIGHT  02/15/2024   IR NEPHROSTOMY EXCHANGE RIGHT  04/11/2024   IR NEPHROSTOMY EXCHANGE RIGHT  06/05/2024   IR NEPHROSTOMY EXCHANGE RIGHT  08/01/2024   IR NEPHROSTOMY PLACEMENT RIGHT  05/26/2022   TRANSURETHRAL RESECTION OF BLADDER TUMOR N/A 11/19/2017   Procedure: TRANSURETHRAL RESECTION OF BLADDER TUMOR (TURBT);  Surgeon: Devere Lonni Righter, MD;  Location: [facility];  Service: Urology;  Laterality: N/A;   TRANSURETHRAL RESECTION OF BLADDER TUMOR N/A 05/18/2022   Procedure: TRANSURETHRAL RESECTION OF BLADDER TUMOR (TURBT);  Surgeon: Sherrilee Belvie CROME, MD;  Location: [facility];  Service: Urology;  Laterality: N/A;   TRANSURETHRAL RESECTION OF BLADDER TUMOR N/A 11/12/2022   Procedure: TRANSURETHRAL RESECTION OF BLADDER TUMOR (TURBT);  Surgeon: Sherrilee Belvie CROME, MD;  Location: [facility];  Service: Urology;  Laterality: N/A;    Home Medications:  Allergies as of 08/16/2024       Reactions   Macrobid  [nitrofurantoin ]    ?epig abd pain        Medication List        Accurate as of August 16, 2024  9:47 AM. If you have any questions, ask your nurse or doctor.          AMBULATORY NON FORMULARY MEDICATION Medication Name: Sodium Chloride  flush  Use 70 cc syringe to flush  amoxicillin -clavulanate 875-125 MG tablet Commonly known as: AUGMENTIN  Take 1 tablet by mouth 2 (two) times daily.   b complex vitamins capsule Take 1 capsule by mouth daily.   B-D 3CC LUER-LOK SYR 23GX1 23G X 1 3 ML Misc Generic drug: SYRINGE-NEEDLE (DISP) 3 ML Use every day for IM injections (Rocephin )   BD PosiFlush 0.9 % Soln injection Generic drug: sodium chloride  flush USE TEN MLS AS DIRECTED PER MD   BLACK CURRANT SEED OIL PO Take 1 capsule by mouth daily.   butalbital -apap-caffeine-codeine 50-325-40-30 MG  capsule Commonly known as: FIORICET WITH CODEINE Take 1-2 capsules by mouth every 6 (six) hours as needed for headache or other (pain).   cephALEXin  500 MG capsule Commonly known as: KEFLEX  Take 1 capsule (500 mg total) by mouth 4 (four) times daily.   Goodys Extra Strength 520-260-32.5 MG Pack Generic drug: Aspirin-Acetaminophen -Caffeine Take 1-2 Packages by mouth every 6 (six) hours as needed (Pain).   ketorolac  10 MG tablet Commonly known as: TORADOL  Take 1 tablet (10 mg total) by mouth every 8 (eight) hours as needed.   lidocaine  1 % (with preservative) injection Commonly known as: XYLOCAINE  Use 1-2 ML as diluent  for 1G Rocephin  IM injection daily   oxybutynin  5 MG tablet Commonly known as: DITROPAN  Take 1 tablet (5 mg total) by mouth 3 (three) times daily.   RABEprazole  20 MG tablet Commonly known as: Aciphex  Take 1 tablet (20 mg total) by mouth 2 (two) times daily.   sodium chloride  0.9 % injection Inject 3 mLs into the vein as needed. Inject 3mLs daily   traMADol  50 MG tablet Commonly known as: ULTRAM  Take 1 tablet (50 mg total) by mouth every 8 (eight) hours as needed.   Vitamin D3 50 MCG (2000 UT) capsule Take 1 capsule (2,000 Units total) by mouth daily.        Allergies:  Allergies  Allergen Reactions   Macrobid  [Nitrofurantoin ]     ?epig abd pain    Family History: Family History  Problem Relation Age of Onset   Cancer Mother    Depression Mother    Arthritis Father    Depression Father    Hearing loss Father    Depression Daughter    Early death Son     Social History:  reports that he has been smoking cigarettes. He has a 30 pack-year smoking history. He has never used smokeless tobacco. He reports that he does not drink alcohol and does not use drugs.  ROS: All other review of systems were reviewed and are negative except what is noted above in HPI  Physical Exam: BP 124/73   Pulse 90   Constitutional:  Alert and oriented, No acute  distress. HEENT: [place] AT, moist mucus membranes.  Trachea midline, no masses. Cardiovascular: No clubbing, cyanosis, or edema. Respiratory: Normal respiratory effort, no increased work of breathing. GI: Abdomen is soft, nontender, nondistended, no abdominal masses GU: No CVA tenderness.  Lymph: No cervical or inguinal lymphadenopathy. Skin: No rashes, bruises or suspicious lesions. Neurologic: Grossly intact, no focal deficits, moving all 4 extremities. Psychiatric: Normal mood and affect.  Laboratory Data: Lab Results  Component Value Date   WBC 13.0 (H) 08/06/2024   HGB 15.7 08/06/2024   HCT 47.6 08/06/2024   MCV 92.2 08/06/2024   PLT 229 08/06/2024    Lab Results  Component Value Date   CREATININE 1.39 (H) 08/06/2024    Lab Results  Component Value Date   PSA 1.18 10/08/2021   PSA  1.30 09/15/2017    Lab Results  Component Value Date   TESTOSTERONE  261.70 (L) 09/15/2017    Lab Results  Component Value Date   HGBA1C 5.4 05/05/2024    Urinalysis    Component Value Date/Time   COLORURINE YELLOW 08/07/2024 0016   APPEARANCEUR CLOUDY (A) 08/07/2024 0016   APPEARANCEUR Cloudy (A) 06/28/2024 1033   LABSPEC 1.018 08/07/2024 0016   PHURINE 6.0 08/07/2024 0016   GLUCOSEU NEGATIVE 08/07/2024 0016   GLUCOSEU NEGATIVE 12/29/2023 1552   HGBUR LARGE (A) 08/07/2024 0016   BILIRUBINUR NEGATIVE 08/07/2024 0016   BILIRUBINUR Negative 06/28/2024 1033   KETONESUR NEGATIVE 08/07/2024 0016   PROTEINUR 100 (A) 08/07/2024 0016   UROBILINOGEN 0.2 12/29/2023 1552   NITRITE NEGATIVE 08/07/2024 0016   LEUKOCYTESUR NEGATIVE 08/07/2024 0016    Lab Results  Component Value Date   LABMICR See below: 06/28/2024   WBCUA >30 (A) 06/28/2024   LABEPIT 0-10 06/28/2024   MUCUS Presence of (A) 12/29/2023   BACTERIA NONE SEEN 08/07/2024    Pertinent Imaging:  No results found for this or any previous visit.  No results found for this or any previous visit.  No results found for  this or any previous visit.  No results found for this or any previous visit.  No results found for this or any previous visit.  No results found for this or any previous visit.  No results found for this or any previous visit.  Results for orders placed during the hospital encounter of 08/06/24  CT Renal Stone Study  Narrative EXAM: CT UROGRAM 08/06/2024 11:43:08 PM  TECHNIQUE: CT of the abdomen and pelvis was performed without the administration of intravenous contrast. Multiplanar reformatted images as well as MIP urogram images are provided for review. Automated exposure control, iterative reconstruction, and/or weight based adjustment of the mA/kV was utilized to reduce the radiation dose to as low as reasonably achievable.  COMPARISON: 05/05/2024  CLINICAL HISTORY: Abdominal/flank pain, stone suspected.  FINDINGS:  LOWER CHEST: No acute abnormality.  LIVER: The liver is unremarkable.  GALLBLADDER AND BILE DUCTS: Gallbladder is unremarkable. No biliary ductal dilatation.  SPLEEN: No acute abnormality.  PANCREAS: No acute abnormality.  ADRENAL GLANDS: No acute abnormality.  KIDNEYS, URETERS AND BLADDER: Mild right renal atrophy, chronic. Indwelling right percutaneous nephrostomy catheter. Moderate layering bladder hemorrhage (image 77). 6.7 x 5.3 cm right posterolateral bladder mass (image 23), overlying the right ureteral orifice, suggesting primary bladder cancer. No stones in the kidneys or ureters. No hydronephrosis. No perinephric or periureteral stranding.  GI AND BOWEL: Stomach demonstrates no acute abnormality. Normal appendix (image 60). There is no bowel obstruction.  PERITONEUM AND RETROPERITONEUM: No ascites. No free air.  VASCULATURE: Aorta is normal in caliber. Atherosclerotic calcifications of the abdominal aorta and branch vessels.  LYMPH NODES: No lymphadenopathy.  REPRODUCTIVE ORGANS: No acute abnormality.  BONES AND  SOFT TISSUES: Mild degenerative changes of the lower thoracic spine. No acute osseous abnormality. No focal soft tissue abnormality.  IMPRESSION: 1. 6.7 cm right posterolateral bladder mass overlying the right ureteral orifice, suggesting primary bladder cancer. 2. Moderate layering bladder hemorrhage. 3. Indwelling right nephrostomy. No hydronephrosis.  Electronically signed by: Pinkie Pebbles MD 08/06/2024 11:52 PM EST RP Workstation: [id number]   Assessment & Plan:    1. Malignant neoplasm of lateral wall of urinary bladder (HCC) (Primary) The risks/benefits/alteranitves to transurethral resection of a bladder tumor was explained to the patient and he understands and wishes to proceed with surgery - ciprofloxacin  (CIPRO ) tablet  500 mg - Bladder Voiding Trial  2. Urinary retention -resolved   No follow-ups on file.  Belvie Clara, MD  [facility] [place]

## 2024-08-20 ENCOUNTER — Encounter: Payer: Self-pay | Admitting: Urology

## 2024-08-20 NOTE — Patient Instructions (Signed)
 Transurethral Resection of Bladder Tumor  Transurethral resection of a bladder tumor is the removal (resection) of cancerous tissue (tumor) from the inside wall of the bladder. The bladder is the organ that holds urine. The tumor is removed through the tube that carries urine out of the body (urethra). In a transurethral resection, a thin telescope with a light, a tiny camera, and an electric cutting edge (resectoscope) is passed through the urethra. In men, the opening of the urethra is at the end of the penis. In women, it is just above the opening of the vagina. Tell a health care provider about: Any allergies you have. All medicines you are taking, including vitamins, herbs, eye drops, creams, and over-the-counter medicines. Any problems you or family members have had with anesthetic medicines. Any bleeding problems you have. Any surgeries you have had. Any medical conditions you have, including recent urinary tract infections. Whether you are pregnant or may be pregnant. What are the risks? Generally, this is a safe procedure. However, problems may occur, including: Infection. Bleeding. Allergic reactions to medicines. Damage to nearby structures or organs. Difficulty urinating from blockage of the urethra or not being able to urinate (urinary retention). Deep vein thrombosis. This is a blood clot that can develop in your leg. Recurring cancer. What happens before the procedure? When to stop eating and drinking Follow instructions from your health care provider about what you may eat and drink before your procedure. These may include: 8 hours before your procedure Stop eating most foods. Do not eat meat, fried foods, or fatty foods. Eat only light foods, such as toast or crackers. All liquids are okay except energy drinks and alcohol. 6 hours before your procedure Stop eating. Drink only clear liquids, such as water, clear fruit juice, black coffee, plain tea, and sports  drinks. Do not drink energy drinks or alcohol. 2 hours before your procedure Stop drinking all liquids. You may be allowed to take medicines with small sips of water. Medicines Ask your health care provider about: Changing or stopping your regular medicines. This is especially important if you are taking diabetes medicines or blood thinners. Taking medicines such as aspirin and ibuprofen. These medicines can thin your blood. Do not take these medicines unless your health care provider tells you to take them. Taking over-the-counter medicines, vitamins, herbs, and supplements. General instructions If you will be going home right after the procedure, plan to have a responsible adult: Take you home from the hospital or clinic. You will not be allowed to drive. Care for you for the time you are told. Ask your health care provider what steps will be taken to help prevent infection. These steps may include: Washing skin with a germ-killing soap. Taking antibiotic medicine. Do not use any products that contain nicotine or tobacco for at least 4 weeks before the procedure. These products include cigarettes, chewing tobacco, and vaping devices, such as e-cigarettes. If you need help quitting, ask your health care provider. What happens during the procedure? An IV will be inserted into one of your veins. You will be given one or more of the following: A medicine to help you relax (sedative). A medicine that is injected into your spine to numb the area below and slightly above the injection site (spinal anesthetic). A medicine that is injected into an area of your body to numb everything below the injection site (regional anesthetic). A medicine to make you fall asleep (general anesthetic). Your legs will be placed in foot rests (  stirrups) to open your legs and bend your knees. The resectoscope will be passed through your urethra and into your bladder. The part of your bladder with the tumor will be  resected by the cutting edge of the resectoscope. Fluid will be passed to rinse out the cut tissues (irrigation). The resectoscope will then be taken out. A small, thin tube (catheter) will be passed through your urethra and into your bladder. The catheter will drain urine into a bag outside of your body. The procedure may vary among health care providers and hospitals. What happens after the procedure? Your blood pressure, heart rate, breathing rate, and blood oxygen level will be monitored until you leave the hospital or clinic. You may continue to receive fluids and medicines through an IV. You will be given pain medicine to relieve pain. You will have a catheter to drain your urine. The amount of urine will be measured. If you have blood in your urine, your bladder may be rinsed out by passing fluid through your catheter. You will be encouraged to walk as soon as you can. You may have to wear compression stockings. These stockings help to prevent blood clots and reduce swelling in your legs. If you were given a sedative during the procedure, it can affect you for several hours. Do not drive or operate machinery until your health care provider says that it is safe. Summary Transurethral resection of a bladder tumor is the removal (resection) of a cancerous growth (tumor) on the inside wall of the bladder. To do this procedure, your health care provider uses a thin telescope with a light, a tiny camera, and an electric cutting edge (resectoscope) that is guided to your bladder through your urethra. The part of your bladder that is affected by the tumor will be resected by the cutting edge of the resectoscope. A catheter will be passed through your urethra and into your bladder. The catheter will drain urine into a bag outside of your body. If you will be going home right after the procedure, plan to have a responsible adult take you home from the hospital or clinic. You will not be allowed to  drive. This information is not intended to replace advice given to you by your health care provider. Make sure you discuss any questions you have with your health care provider. Document Revised: 09/19/2021 Document Reviewed: 09/19/2021 Elsevier Patient Education  2024 [employer].

## 2024-09-05 ENCOUNTER — Telehealth: Payer: Self-pay | Admitting: Internal Medicine

## 2024-09-05 ENCOUNTER — Ambulatory Visit: Payer: Self-pay | Admitting: Internal Medicine

## 2024-09-05 ENCOUNTER — Encounter: Payer: Self-pay | Admitting: Internal Medicine

## 2024-09-05 VITALS — BP 122/82 | HR 95 | Ht 75.0 in | Wt 260.0 lb

## 2024-09-05 DIAGNOSIS — K2100 Gastro-esophageal reflux disease with esophagitis, without bleeding: Secondary | ICD-10-CM

## 2024-09-05 DIAGNOSIS — C689 Malignant neoplasm of urinary organ, unspecified: Secondary | ICD-10-CM

## 2024-09-05 DIAGNOSIS — E559 Vitamin D deficiency, unspecified: Secondary | ICD-10-CM

## 2024-09-05 DIAGNOSIS — C672 Malignant neoplasm of lateral wall of bladder: Secondary | ICD-10-CM

## 2024-09-05 DIAGNOSIS — F419 Anxiety disorder, unspecified: Secondary | ICD-10-CM

## 2024-09-05 DIAGNOSIS — N3289 Other specified disorders of bladder: Secondary | ICD-10-CM

## 2024-09-05 DIAGNOSIS — R202 Paresthesia of skin: Secondary | ICD-10-CM

## 2024-09-05 LAB — COMPREHENSIVE METABOLIC PANEL WITH GFR
ALT: 13 U/L (ref 0–53)
AST: 13 U/L (ref 0–37)
Albumin: 5 g/dL (ref 3.5–5.2)
Alkaline Phosphatase: 54 U/L (ref 39–117)
BUN: 18 mg/dL (ref 6–23)
CO2: 28 meq/L (ref 19–32)
Calcium: 10.3 mg/dL (ref 8.4–10.5)
Chloride: 102 meq/L (ref 96–112)
Creatinine, Ser: 1 mg/dL (ref 0.40–1.50)
GFR: 82.4 mL/min (ref >60.00)
Glucose, Bld: 93 mg/dL (ref 70–99)
Potassium: 4.5 meq/L (ref 3.5–5.1)
Sodium: 139 meq/L (ref 135–145)
Total Bilirubin: 0.4 mg/dL (ref 0.2–1.2)
Total Protein: 7.6 g/dL (ref 6.0–8.3)

## 2024-09-05 LAB — URINALYSIS, ROUTINE W REFLEX MICROSCOPIC
Ketones, ur: NEGATIVE
Nitrite: NEGATIVE
Specific Gravity, Urine: 1.02 (ref 1.000–1.030)
Total Protein, Urine: 100 — AB
Urine Glucose: NEGATIVE
Urobilinogen, UA: 0.2 (ref 0.0–1.0)
pH: 6 (ref 5.0–8.0)

## 2024-09-05 LAB — CBC WITH DIFFERENTIAL/PLATELET
Basophils Absolute: 0.1 K/uL (ref 0.0–0.1)
Basophils Relative: 0.5 % (ref 0.0–3.0)
Eosinophils Absolute: 0.3 K/uL (ref 0.0–0.7)
Eosinophils Relative: 2.6 % (ref 0.0–5.0)
HCT: 46.5 % (ref 39.0–52.0)
Hemoglobin: 15.5 g/dL (ref 13.0–17.0)
Lymphocytes Relative: 24.1 % (ref 12.0–46.0)
Lymphs Abs: 2.8 K/uL (ref 0.7–4.0)
MCHC: 33.3 g/dL (ref 30.0–36.0)
MCV: 90.1 fl (ref 78.0–100.0)
Monocytes Absolute: 0.7 K/uL (ref 0.1–1.0)
Monocytes Relative: 5.7 % (ref 3.0–12.0)
Neutro Abs: 7.7 K/uL (ref 1.4–7.7)
Neutrophils Relative %: 67.1 % (ref 43.0–77.0)
Platelets: 249 K/uL (ref 150.0–400.0)
RBC: 5.16 Mil/uL (ref 4.22–5.81)
RDW: 13.7 % (ref 11.5–15.5)
WBC: 11.5 K/uL — ABNORMAL HIGH (ref 4.0–10.5)

## 2024-09-05 LAB — VITAMIN D 25 HYDROXY (VIT D DEFICIENCY, FRACTURES): VITD: 25.86 ng/mL — ABNORMAL LOW (ref 30.00–100.00)

## 2024-09-05 LAB — VITAMIN B12: Vitamin B-12: 489 pg/mL (ref 211–911)

## 2024-09-05 MED ORDER — HYDROCODONE-ACETAMINOPHEN 10-325 MG PO TABS: 1.0000 | ORAL_TABLET | Freq: Three times a day (TID) | ORAL | 0 refills | Status: AC | PRN

## 2024-09-05 MED ORDER — KETOROLAC TROMETHAMINE 10 MG PO TABS: 10.0000 mg | ORAL_TABLET | Freq: Three times a day (TID) | ORAL | 1 refills | Status: AC | PRN

## 2024-09-05 MED ORDER — OXYBUTYNIN CHLORIDE 5 MG PO TABS: 5.0000 mg | ORAL_TABLET | Freq: Three times a day (TID) | ORAL | 3 refills | Status: AC

## 2024-09-05 NOTE — Assessment & Plan Note (Signed)
 On Vit D

## 2024-09-05 NOTE — Patient Instructions (Addendum)
 [employer] - CBD gummies - get highest dose (no Delta 9 or Delta 8)

## 2024-09-05 NOTE — Telephone Encounter (Signed)
 Hadassah, can we do a PA for Rabeprazol and for Fioricet w/codeine, pls Thx

## 2024-09-05 NOTE — Addendum Note (Signed)
 Addended by: Dandrae Kustra V on: 09/05/2024 03:57 PM   Modules accepted: Orders

## 2024-09-05 NOTE — Assessment & Plan Note (Signed)
 Norco prn 10/325 tid prn Toradol  prn - d/c BC poweder helps Dr Sherrilee is planning to do TURBT, WITH CHEMOTHERAPEUTIC AGENT INSTILLATION INTO BLADDER on 10/04/2024

## 2024-09-05 NOTE — Progress Notes (Addendum)
 Subjective:  Patient ID: Christian Kennedy, male    DOB: 1966/03/17  Age: 58 y.o. MRN: [id number]  CC: Medical Management of Chronic Issues (6 week follow up. Has since seen urologist)   HPI [name] presents for urinary sx's, bleeding, pain and burning BC powder Dr Sherrilee is planning to do TURBT, WITH CHEMOTHERAPEUTIC AGENT INSTILLATION INTO BLADDER on 10/04/2024  F/u on GERD, cancer   Outpatient Medications Prior to Visit  Medication Sig Dispense Refill   AMBULATORY NON FORMULARY MEDICATION Medication Name: Sodium Chloride  flush  Use 70 cc syringe to flush 3 Bottle 0   Aspirin-Acetaminophen -Caffeine (GOODYS EXTRA STRENGTH) 520-260-32.5 MG PACK Take 1-2 Packages by mouth every 6 (six) hours as needed (Pain).     b complex vitamins capsule Take 1 capsule by mouth daily.     BLACK CURRANT SEED OIL PO Take 1 capsule by mouth daily.     butalbital -apap-caffeine-codeine (FIORICET WITH CODEINE) 50-325-40-30 MG capsule Take 1-2 capsules by mouth every 6 (six) hours as needed for headache or other (pain). 40 capsule 1   Cholecalciferol (VITAMIN D3) 50 MCG (2000 UT) CAPS Take 1 capsule (2,000 Units total) by mouth daily. 100 capsule 3   lidocaine  (XYLOCAINE ) 1 % (with preservative) injection Use 1-2 ML as diluent  for 1G Rocephin  IM injection daily 10 mL 0   RABEprazole  (ACIPHEX ) 20 MG tablet Take 1 tablet (20 mg total) by mouth 2 (two) times daily. 180 tablet 3   sodium chloride  0.9 % injection Inject 3 mLs into the vein as needed. Inject 3mLs daily 300 mL 3   sodium chloride  flush (BD POSIFLUSH) 0.9 % SOLN injection USE TEN MLS AS DIRECTED PER MD 3 mL 5   SYRINGE-NEEDLE, DISP, 3 ML (BD ECLIPSE SYRINGE/NEEDLE) 23G X 1 3 ML MISC Use every day for IM injections (Rocephin ) 50 each 0   amoxicillin -clavulanate (AUGMENTIN ) 875-125 MG tablet Take 1 tablet by mouth 2 (two) times daily. 28 tablet 0   cephALEXin  (KEFLEX ) 500 MG capsule Take 1 capsule (500 mg total) by mouth 4 (four) times  daily. 20 capsule 0   ketorolac  (TORADOL ) 10 MG tablet Take 1 tablet (10 mg total) by mouth every 8 (eight) hours as needed. 60 tablet 1   oxybutynin  (DITROPAN ) 5 MG tablet Take 1 tablet (5 mg total) by mouth 3 (three) times daily. 90 tablet 3   traMADol  (ULTRAM ) 50 MG tablet Take 1 tablet (50 mg total) by mouth every 8 (eight) hours as needed. 90 tablet 3   No facility-administered medications prior to visit.    ROS: Review of Systems  Constitutional:  Positive for fatigue. Negative for appetite change and unexpected weight change.  HENT:  Negative for congestion, nosebleeds, sneezing, sore throat and trouble swallowing.   Eyes:  Negative for itching and visual disturbance.  Respiratory:  Negative for cough.   Cardiovascular:  Negative for chest pain, palpitations and leg swelling.  Gastrointestinal:  Negative for abdominal distention, blood in stool, diarrhea and nausea.  Genitourinary:  Positive for dysuria, flank pain, hematuria and urgency. Negative for frequency.  Musculoskeletal:  Negative for back pain, gait problem, joint swelling and neck pain.  Skin:  Negative for rash.  Neurological:  Negative for dizziness, tremors, speech difficulty and weakness.  Hematological:  Does not bruise/bleed easily.  Psychiatric/Behavioral:  Negative for agitation, dysphoric mood and sleep disturbance. The patient is not nervous/anxious.     Objective:  BP 122/82   Pulse 95   Ht 6' 3 (1.905 m)  Wt 260 lb (117.9 kg)   SpO2 97%   BMI 32.50 kg/m   BP Readings from Last 3 Encounters:  09/05/24 122/82  08/16/24 124/73  08/07/24 (!) 142/79    Wt Readings from Last 3 Encounters:  09/05/24 260 lb (117.9 kg)  08/05/24 260 lb (117.9 kg)  07/21/24 260 lb (117.9 kg)    Physical Exam Constitutional:      General: He is not in acute distress.    Appearance: He is well-developed. He is not toxic-appearing.     Comments: NAD  Eyes:     Conjunctiva/sclera: Conjunctivae normal.     Pupils:  Pupils are equal, round, and reactive to light.  Neck:     Thyroid : No thyromegaly.     Vascular: No JVD.  Cardiovascular:     Rate and Rhythm: Normal rate and regular rhythm.     Heart sounds: Normal heart sounds. No murmur heard.    No friction rub. No gallop.  Pulmonary:     Effort: Pulmonary effort is normal. No respiratory distress.     Breath sounds: Normal breath sounds. No wheezing or rales.  Chest:     Chest wall: No tenderness.  Abdominal:     General: Bowel sounds are normal. There is no distension.     Palpations: Abdomen is soft. There is no mass.     Tenderness: There is no abdominal tenderness. There is no guarding or rebound.  Musculoskeletal:        General: No tenderness. Normal range of motion.     Cervical back: Normal range of motion.     Right lower leg: No edema.     Left lower leg: No edema.  Lymphadenopathy:     Cervical: No cervical adenopathy.  Skin:    General: Skin is warm and dry.     Findings: No rash.  Neurological:     Mental Status: He is alert and oriented to person, place, and time.     Cranial Nerves: No cranial nerve deficit.     Motor: No weakness or abnormal muscle tone.     Coordination: Coordination normal.     Gait: Gait normal.     Deep Tendon Reflexes: Reflexes are normal and symmetric.  Psychiatric:        Behavior: Behavior normal.        Thought Content: Thought content normal.        Judgment: Judgment normal.   Bloody irine in the bag Looks tired  Lab Results  Component Value Date   WBC 13.0 (H) 08/06/2024   HGB 15.7 08/06/2024   HCT 47.6 08/06/2024   PLT 229 08/06/2024   GLUCOSE 98 08/06/2024   CHOL 214 (H) 12/29/2023   TRIG 258.0 (H) 12/29/2023   HDL 43.10 12/29/2023   LDLDIRECT 132.0 10/08/2021   LDLCALC 119 (H) 12/29/2023   ALT 11 08/06/2024   AST 13 (L) 08/06/2024   NA 139 08/06/2024   K 4.3 08/06/2024   CL 105 08/06/2024   CREATININE 1.39 (H) 08/06/2024   BUN 23 (H) 08/06/2024   CO2 22 08/06/2024    TSH 0.83 12/29/2023   PSA 1.18 10/08/2021   INR 0.9 08/06/2024   HGBA1C 5.4 05/05/2024    CT Renal Stone Study Result Date: 08/06/2024 EXAM: CT UROGRAM 08/06/2024 11:43:08 PM TECHNIQUE: CT of the abdomen and pelvis was performed without the administration of intravenous contrast. Multiplanar reformatted images as well as MIP urogram images are provided for review. Automated exposure control, iterative  reconstruction, and/or weight based adjustment of the mA/kV was utilized to reduce the radiation dose to as low as reasonably achievable. COMPARISON: 05/05/2024 CLINICAL HISTORY: Abdominal/flank pain, stone suspected. FINDINGS: LOWER CHEST: No acute abnormality. LIVER: The liver is unremarkable. GALLBLADDER AND BILE DUCTS: Gallbladder is unremarkable. No biliary ductal dilatation. SPLEEN: No acute abnormality. PANCREAS: No acute abnormality. ADRENAL GLANDS: No acute abnormality. KIDNEYS, URETERS AND BLADDER: Mild right renal atrophy, chronic. Indwelling right percutaneous nephrostomy catheter. Moderate layering bladder hemorrhage (image 77). 6.7 x 5.3 cm right posterolateral bladder mass (image 23), overlying the right ureteral orifice, suggesting primary bladder cancer. No stones in the kidneys or ureters. No hydronephrosis. No perinephric or periureteral stranding. GI AND BOWEL: Stomach demonstrates no acute abnormality. Normal appendix (image 60). There is no bowel obstruction. PERITONEUM AND RETROPERITONEUM: No ascites. No free air. VASCULATURE: Aorta is normal in caliber. Atherosclerotic calcifications of the abdominal aorta and branch vessels. LYMPH NODES: No lymphadenopathy. REPRODUCTIVE ORGANS: No acute abnormality. BONES AND SOFT TISSUES: Mild degenerative changes of the lower thoracic spine. No acute osseous abnormality. No focal soft tissue abnormality. IMPRESSION: 1. 6.7 cm right posterolateral bladder mass overlying the right ureteral orifice, suggesting primary bladder cancer. 2. Moderate layering  bladder hemorrhage. 3. Indwelling right nephrostomy. No hydronephrosis. Electronically signed by: Pinkie Pebbles MD 08/06/2024 11:52 PM EST RP Workstation: [id number]    Assessment & Plan:   Problem List Items Addressed This Visit     Anxiety - Primary   Doing fair      GERD (gastroesophageal reflux disease)   Not much better Needs Rabeprazole  PA Try Voquezna 10 mg/d samples      Malignant neoplasm of lateral wall of urinary bladder (HCC)   Norco prn 10/325 tid prn Toradol  prn - d/c BC poweder helps Dr Sherrilee is planning to do TURBT, WITH CHEMOTHERAPEUTIC AGENT INSTILLATION INTO BLADDER on 10/04/2024      Relevant Orders   CBC with Differential/Platelet   Comprehensive metabolic panel with GFR   CULTURE, URINE COMPREHENSIVE   Urinalysis   Vitamin B12   VITAMIN D  25 Hydroxy (Vit-D Deficiency, Fractures)   Painful bladder spasm   Ditropan  prn      Relevant Orders   CBC with Differential/Platelet   Comprehensive metabolic panel with GFR   CULTURE, URINE COMPREHENSIVE   Urinalysis   Vitamin B12   VITAMIN D  25 Hydroxy (Vit-D Deficiency, Fractures)   Urothelial cancer (HCC)   Dr Sherrilee is planning to do TURBT, WITH CHEMOTHERAPEUTIC AGENT INSTILLATION INTO BLADDER on 10/04/2024      Relevant Orders   CBC with Differential/Platelet   Comprehensive metabolic panel with GFR   CULTURE, URINE COMPREHENSIVE   Urinalysis   Vitamin B12   VITAMIN D  25 Hydroxy (Vit-D Deficiency, Fractures)   Vitamin D  deficiency   On Vit D      Relevant Orders   VITAMIN D  25 Hydroxy (Vit-D Deficiency, Fractures)   Other Visit Diagnoses       Paresthesias       Relevant Orders   CBC with Differential/Platelet   Vitamin B12   VITAMIN D  25 Hydroxy (Vit-D Deficiency, Fractures)         Meds ordered this encounter  Medications   HYDROcodone -acetaminophen  (NORCO) 10-325 MG tablet    Sig: Take 1 tablet by mouth every 8 (eight) hours as needed.    Dispense:  90 tablet    Refill:   0    Code: C67.2 bladder cancer   ketorolac  (TORADOL ) 10 MG tablet  Sig: Take 1 tablet (10 mg total) by mouth every 8 (eight) hours as needed.    Dispense:  60 tablet    Refill:  1    Urinary pain   oxybutynin  (DITROPAN ) 5 MG tablet    Sig: Take 1 tablet (5 mg total) by mouth 3 (three) times daily.    Dispense:  90 tablet    Refill:  3      Follow-up: Return in about 6 weeks (around 10/17/2024) for a follow-up visit.  Marolyn Noel, MD

## 2024-09-05 NOTE — Assessment & Plan Note (Signed)
Doing fair 

## 2024-09-05 NOTE — Assessment & Plan Note (Addendum)
 Not much better Needs Rabeprazole  PA Try Voquezna 10 mg/d samples

## 2024-09-05 NOTE — Assessment & Plan Note (Signed)
 Dr Sherrilee is planning to do TURBT, WITH CHEMOTHERAPEUTIC AGENT INSTILLATION INTO BLADDER on 10/04/2024

## 2024-09-05 NOTE — Assessment & Plan Note (Signed)
 Ditropan prn

## 2024-09-06 ENCOUNTER — Other Ambulatory Visit (HOSPITAL_COMMUNITY): Payer: Self-pay

## 2024-09-06 ENCOUNTER — Telehealth: Payer: Self-pay

## 2024-09-06 ENCOUNTER — Ambulatory Visit: Payer: Self-pay | Admitting: Internal Medicine

## 2024-09-06 NOTE — Telephone Encounter (Signed)
 Pharmacy Patient Advocate Encounter   Received notification from Physician's Office that prior authorization for Butalbital -APAP-Caff-Cod 50-325-40-30MG  capsules is required/requested.   Insurance verification completed.   The patient is insured through [facility].   Per test claim: PA required; PA submitted to above mentioned insurance via Latent Key/confirmation #/EOC [id number] Status is pending

## 2024-09-06 NOTE — Telephone Encounter (Signed)
 Pharmacy Patient Advocate Encounter   Received notification from Physician's Office that prior authorization for RABEprazole  Sodium 20MG  dr tablets  is required/requested.   Insurance verification completed.   The patient is insured through [facility].   Per test claim: PA required; PA submitted to above mentioned insurance via Latent Key/confirmation #/EOC [id number] Status is pending

## 2024-09-07 ENCOUNTER — Telehealth: Payer: Self-pay

## 2024-09-07 ENCOUNTER — Other Ambulatory Visit (HOSPITAL_COMMUNITY): Payer: Self-pay

## 2024-09-07 LAB — CULTURE, URINE COMPREHENSIVE: RESULT:: NO GROWTH

## 2024-09-07 NOTE — Telephone Encounter (Signed)
 Pharmacy Patient Advocate Encounter  Received notification from [facility] MEDICAID that Prior Authorization for Rabeprazole  20mg  tabs has been APPROVED from 09/06/24 to 09/06/25   PA #/Case ID/Reference #: [id number]

## 2024-09-07 NOTE — Telephone Encounter (Signed)
 Placed call to [facility] to see if they could get the generic Fioricet with codeine to go thru.   Per the [facility], the patient had the Hydrocodone /APAP filled recently and asked if the patient is to be on both for pain.  The [facility] was able to get the generic Fioricet with codeine to go through to the insurance.

## 2024-09-07 NOTE — Telephone Encounter (Signed)
 Pharmacy Patient Advocate Encounter  Received notification from [employer] that Prior Authorization for Butalbital -APAP-Caff-Cod 50-325-40-30MG  capsules has been DENIED.  Full denial letter will be uploaded to the media tab. See denial reason below.  Requires trials of two preferred short-acting opioids or documented contraindication. Preferred options include Percocet, Roxicodone , Norco/Vicodin, Dilaudid , MSIR, Tylox, Tylenol  with Codeine, tramadol , Ultracet.   PA #/Case ID/Reference #: [id number]

## 2024-09-26 ENCOUNTER — Other Ambulatory Visit (HOSPITAL_COMMUNITY): Payer: Self-pay | Admitting: Interventional Radiology

## 2024-09-26 ENCOUNTER — Ambulatory Visit (HOSPITAL_COMMUNITY)
Admission: RE | Admit: 2024-09-26 | Discharge: 2024-09-26 | Disposition: A | Discharge status: Home / Self Care | Source: Ambulatory Visit | Attending: Interventional Radiology | Admitting: Interventional Radiology

## 2024-09-26 DIAGNOSIS — Z436 Encounter for attention to other artificial openings of urinary tract: Secondary | ICD-10-CM | POA: Diagnosis present

## 2024-09-26 DIAGNOSIS — C679 Malignant neoplasm of bladder, unspecified: Secondary | ICD-10-CM | POA: Diagnosis not present

## 2024-09-26 HISTORY — PX: IR NEPHROSTOMY EXCHANGE RIGHT: IMG6070

## 2024-09-26 MED ORDER — IOHEXOL 300 MG/ML  SOLN
50.0000 mL | Freq: Once | INTRAMUSCULAR | Status: AC | PRN
  Administered 2024-09-26: 15 mL

## 2024-09-27 ENCOUNTER — Encounter (HOSPITAL_COMMUNITY)
Admission: RE | Admit: 2024-09-27 | Discharge: 2024-09-27 | Disposition: A | Discharge status: Home / Self Care | Source: Ambulatory Visit | Attending: Urology | Admitting: Urology

## 2024-09-27 ENCOUNTER — Emergency Department (HOSPITAL_COMMUNITY)
Admission: EM | Admit: 2024-09-27 | Discharge: 2024-09-28 | Disposition: A | Discharge status: Home / Self Care | Attending: Emergency Medicine | Admitting: Emergency Medicine

## 2024-09-27 ENCOUNTER — Other Ambulatory Visit: Payer: Self-pay

## 2024-09-27 ENCOUNTER — Encounter (HOSPITAL_COMMUNITY): Payer: Self-pay | Admitting: Emergency Medicine

## 2024-09-27 DIAGNOSIS — R339 Retention of urine, unspecified: Secondary | ICD-10-CM | POA: Diagnosis not present

## 2024-09-27 DIAGNOSIS — Z8551 Personal history of malignant neoplasm of bladder: Secondary | ICD-10-CM | POA: Insufficient documentation

## 2024-09-27 DIAGNOSIS — R319 Hematuria, unspecified: Secondary | ICD-10-CM | POA: Insufficient documentation

## 2024-09-27 DIAGNOSIS — R1030 Lower abdominal pain, unspecified: Secondary | ICD-10-CM | POA: Insufficient documentation

## 2024-09-27 DIAGNOSIS — R310 Gross hematuria: Secondary | ICD-10-CM

## 2024-09-27 LAB — COMPREHENSIVE METABOLIC PANEL WITH GFR
ALT: 9 U/L (ref 0–44)
AST: 14 U/L — ABNORMAL LOW (ref 15–41)
Albumin: 4.3 g/dL (ref 3.5–5.0)
Alkaline Phosphatase: 54 U/L (ref 38–126)
Anion gap: 8 (ref 5–15)
BUN: 23 mg/dL — ABNORMAL HIGH (ref 6–20)
CO2: 28 mmol/L (ref 22–32)
Calcium: 8.8 mg/dL — ABNORMAL LOW (ref 8.9–10.3)
Chloride: 105 mmol/L (ref 98–111)
Creatinine, Ser: 1.38 mg/dL — ABNORMAL HIGH (ref 0.61–1.24)
GFR, Estimated: 59 mL/min — ABNORMAL LOW
Glucose, Bld: 104 mg/dL — ABNORMAL HIGH (ref 70–99)
Potassium: 4 mmol/L (ref 3.5–5.1)
Sodium: 141 mmol/L (ref 135–145)
Total Bilirubin: 0.3 mg/dL (ref 0.0–1.2)
Total Protein: 6.6 g/dL (ref 6.5–8.1)

## 2024-09-27 LAB — CBC WITH DIFFERENTIAL/PLATELET
Abs Immature Granulocytes: 0.02 K/uL (ref 0.00–0.07)
Basophils Absolute: 0 K/uL (ref 0.0–0.1)
Basophils Relative: 0 %
Eosinophils Absolute: 0.2 K/uL (ref 0.0–0.5)
Eosinophils Relative: 2 %
HCT: 39 % (ref 39.0–52.0)
Hemoglobin: 12.9 g/dL — ABNORMAL LOW (ref 13.0–17.0)
Immature Granulocytes: 0 %
Lymphocytes Relative: 22 %
Lymphs Abs: 2.1 K/uL (ref 0.7–4.0)
MCH: 30.6 pg (ref 26.0–34.0)
MCHC: 33.1 g/dL (ref 30.0–36.0)
MCV: 92.4 fL (ref 80.0–100.0)
Monocytes Absolute: 0.8 K/uL (ref 0.1–1.0)
Monocytes Relative: 8 %
Neutro Abs: 6.5 K/uL (ref 1.7–7.7)
Neutrophils Relative %: 68 %
Platelets: 188 K/uL (ref 150–400)
RBC: 4.22 MIL/uL (ref 4.22–5.81)
RDW: 13.5 % (ref 11.5–15.5)
WBC: 9.6 K/uL (ref 4.0–10.5)
nRBC: 0 % (ref 0.0–0.2)

## 2024-09-27 LAB — URINALYSIS, ROUTINE W REFLEX MICROSCOPIC

## 2024-09-27 LAB — URINALYSIS, MICROSCOPIC (REFLEX): RBC / HPF: >50 RBC/hpf (ref 0–5)

## 2024-09-27 MED ORDER — TRANEXAMIC ACID-NACL 1000-0.7 MG/100ML-% IV SOLN
1000.0000 mg | INTRAVENOUS | Status: AC
  Administered 2024-09-28: 1000 mg via INTRAVENOUS
  Filled 2024-09-27: qty 100

## 2024-09-27 MED ORDER — HYDROMORPHONE HCL 1 MG/ML IJ SOLN
1.0000 mg | Freq: Once | INTRAMUSCULAR | Status: AC
  Administered 2024-09-27: 1 mg via INTRAVENOUS

## 2024-09-27 MED ORDER — HYDROMORPHONE HCL 1 MG/ML IJ SOLN
1.0000 mg | Freq: Once | INTRAMUSCULAR | Status: DC
  Filled 2024-09-27: qty 1

## 2024-09-27 MED ORDER — SODIUM CHLORIDE 0.9 % IV SOLN
2.0000 g | Freq: Once | INTRAVENOUS | Status: AC
  Administered 2024-09-27: 2 g via INTRAVENOUS
  Filled 2024-09-27: qty 20

## 2024-09-27 NOTE — ED Triage Notes (Signed)
 Pt to the ED with complaints of urinary retention since Monday. Pt last voided in the waiting room bathroom, but states it was probably only 50 ml.  Pt has a surgery scheduled with Dr. Jacquline on Monday to address his retention issue.

## 2024-09-27 NOTE — ED Provider Notes (Signed)
 " [place] EMERGENCY DEPARTMENT AT [facility] Provider Note   CSN: [id number] Arrival date & time: 09/27/24  2153     Patient presents with: Urinary Retention   Christian Kennedy is a 58 y.o. male.  {Add pertinent medical, surgical, social history, OB history to YEP:67052} Patient states that he has been having bleeding with his urine for 2 days.  Now he cannot urinate.  He complains of lower abdominal pain.  Patient has a history of a bladder tumor that he is supposed to have surgery on Monday   Abdominal Pain Pain location:  Suprapubic Pain quality: aching   Pain radiates to:  Does not radiate Pain severity:  Moderate Onset quality:  Gradual Timing:  Constant Progression:  Unchanged Chronicity:  Recurrent Context: not alcohol use   Relieved by:  Nothing Worsened by:  Nothing Associated symptoms: no chest pain, no cough, no diarrhea, no fatigue and no hematuria        Prior to Admission medications  Medication Sig Start Date End Date Taking? Authorizing Provider  Aspirin-Acetaminophen -Caffeine (GOODYS EXTRA STRENGTH) 520-260-32.5 MG PACK Take 1-2 Packages by mouth every 6 (six) hours as needed (Pain).    [provider]  b complex vitamins capsule Take 1 capsule by mouth daily.    [provider]  BLACK CURRANT SEED OIL PO Take 1 capsule by mouth daily.    [provider]  butalbital -apap-caffeine-codeine (FIORICET WITH CODEINE) 50-325-40-30 MG capsule Take 1-2 capsules by mouth every 6 (six) hours as needed for headache or other (pain). 07/21/24   Plotnikov, Aleksei V, MD  Cholecalciferol (VITAMIN D3) 50 MCG (2000 UT) CAPS Take 1 capsule (2,000 Units total) by mouth daily. Patient taking differently: Take 4,000-6,000 Units by mouth daily. 11/17/22   Plotnikov, Aleksei V, MD  HYDROcodone -acetaminophen  (NORCO) 10-325 MG tablet Take 1 tablet by mouth every 8 (eight) hours as needed. 09/05/24 10/05/24  Plotnikov, [name], MD  ketorolac   (TORADOL ) 10 MG tablet Take 1 tablet (10 mg total) by mouth every 8 (eight) hours as needed. 09/05/24   Plotnikov, [name], MD  lidocaine  (XYLOCAINE ) 1 % (with preservative) injection Use 1-2 ML as diluent  for 1G Rocephin  IM injection daily Patient not taking: Reported on 09/25/2024 05/25/24   Plotnikov, Aleksei V, MD  oxybutynin  (DITROPAN ) 5 MG tablet Take 1 tablet (5 mg total) by mouth 3 (three) times daily. 09/05/24   Plotnikov, Aleksei V, MD  RABEprazole  (ACIPHEX ) 20 MG tablet Take 1 tablet (20 mg total) by mouth 2 (two) times daily. 07/21/24   Plotnikov, Aleksei V, MD  sodium chloride  0.9 % injection Inject 3 mLs into the vein as needed. Inject 3mLs daily Patient not taking: Reported on 09/25/2024 08/10/24   Plotnikov, [name], MD  sodium chloride  flush (BD POSIFLUSH) 0.9 % SOLN injection USE TEN MLS AS DIRECTED PER MD Patient not taking: Reported on 09/25/2024 08/08/24   Plotnikov, [name], MD  SYRINGE-NEEDLE, DISP, 3 ML (BD ECLIPSE SYRINGE/NEEDLE) 23G X 1 3 ML MISC Use every day for IM injections (Rocephin ) 05/25/24   Plotnikov, [name], MD    Allergies: Macrobid  [nitrofurantoin ]    Review of Systems  Constitutional:  Negative for appetite change and fatigue.  HENT:  Negative for congestion, ear discharge and sinus pressure.   Eyes:  Negative for discharge.  Respiratory:  Negative for cough.   Cardiovascular:  Negative for chest pain.  Gastrointestinal:  Positive for abdominal pain. Negative for diarrhea.  Genitourinary:  Negative for frequency and  hematuria.       Hematuria  Musculoskeletal:  Negative for back pain.  Skin:  Negative for rash.  Neurological:  Negative for seizures and headaches.  Psychiatric/Behavioral:  Negative for hallucinations.     Updated Vital Signs BP 137/77 (BP Location: Right Arm)   Pulse 91   Temp 98.6 F (37 C) (Oral)   Resp 16   Ht 6' 3 (1.905 m)   Wt 117.9 kg   SpO2 99%   BMI 32.49 kg/m   Physical Exam Vitals and nursing note  reviewed.  Constitutional:      Appearance: He is well-developed.  HENT:     Head: Normocephalic.     Nose: Nose normal.  Eyes:     General: No scleral icterus.    Conjunctiva/sclera: Conjunctivae normal.  Neck:     Thyroid : No thyromegaly.  Cardiovascular:     Rate and Rhythm: Normal rate and regular rhythm.     Heart sounds: No murmur heard.    No friction rub. No gallop.  Pulmonary:     Breath sounds: No stridor. No wheezing or rales.  Chest:     Chest wall: No tenderness.  Abdominal:     General: There is no distension.     Tenderness: There is abdominal tenderness. There is no rebound.  Musculoskeletal:        General: Normal range of motion.     Cervical back: Neck supple.  Lymphadenopathy:     Cervical: No cervical adenopathy.  Skin:    Findings: No erythema or rash.  Neurological:     Mental Status: He is alert and oriented to person, place, and time.     Motor: No abnormal muscle tone.     Coordination: Coordination normal.  Psychiatric:        Behavior: Behavior normal.     (all labs ordered are listed, but only abnormal results are displayed) Labs Reviewed  CBC WITH DIFFERENTIAL/PLATELET - Abnormal; Notable for the following components:      Result Value   Hemoglobin 12.9 (*)    All other components within normal limits  COMPREHENSIVE METABOLIC PANEL WITH GFR - Abnormal; Notable for the following components:   Glucose, Bld 104 (*)    BUN 23 (*)    Creatinine, Ser 1.38 (*)    Calcium 8.8 (*)    AST 14 (*)    GFR, Estimated 59 (*)    All other components within normal limits  URINE CULTURE  URINALYSIS, ROUTINE W REFLEX MICROSCOPIC    EKG: None  Radiology: IR NEPHROSTOMY EXCHANGE RIGHT Result Date: 09/26/2024 INDICATION: 58 year old male with history of bladder cancer and chronic indwelling right percutaneous nephrostomy tube presenting for routine check and exchange. EXAM: FLUOROSCOPIC GUIDED RIGHT SIDED NEPHROSTOMY CATHETER EXCHANGE COMPARISON:   08/01/2024 CONTRAST:  A total of 15 mL Isovue -300 administered was administered into the collecting system FLUOROSCOPY TIME:  Ten mGy reference air kerma COMPLICATIONS: None immediate. TECHNIQUE: Informed written consent was obtained from the patient after a discussion of the risks, benefits and alternatives to treatment. Questions regarding the procedure were encouraged and answered. A timeout was performed prior to the initiation of the procedure. The right flank and external portions of existing nephrostomy catheter was prepped and draped in the usual sterile fashion. A sterile drape was applied covering the operative field. Maximum barrier sterile technique with sterile gowns and gloves were used for the procedure. A timeout was performed prior to the initiation of the procedure. A pre procedural spot  fluoroscopic image was obtained. A small amount of contrast was injected via the existing nephrostomy catheter demonstrating appropriate positioning within the renal pelvis. There is no evidence of contrast propagation to the urinary bladder. The existing nephrostomy catheter was cut and cannulated with an Amplatz wire which was coiled within the renal pelvis. Under intermittent fluoroscopic guidance, the existing nephrostomy catheter was exchanged for a new 10 French all-purpose drainage catheter. Limited contrast injection confirmed appropriate positioning within the renal pelvis and a post exchange fluoroscopic image was obtained. The catheter was locked and reconnected to a gravity bag. A dressing was placed. The patient tolerated the procedure well without immediate postprocedural complication. FINDINGS: The existing nephrostomy catheter is appropriately positioned and functioning. After successful fluoroscopic guided exchange, a new 10French nephrostomy catheter is coiled and locked within the renal pelvis. IMPRESSION: Successful fluoroscopic guided exchange of right 10 French percutaneous nephrostomy  catheter. Ester Sides, MD [facility] Electronically Signed   By: Ester Sides M.D.   On: 09/26/2024 16:13    {Document cardiac monitor, telemetry assessment procedure when appropriate:32947} Procedures   Medications Ordered in the ED  cefTRIAXone  (ROCEPHIN ) 2 g in sodium chloride  0.9 % 100 mL IVPB (2 g Intravenous New Bag/Given 09/27/24 2317)  tranexamic acid (CYKLOKAPRON) IVPB 1,000 mg (has no administration in time range)  HYDROmorphone  (DILAUDID ) injection 1 mg (1 mg Intravenous Given 09/27/24 2236)   Patient had a Foley catheter placed and his bladder was drained but is  considerably bloody.  His hemoglobin is 12.9 which is down from 15.5      3 weeks ago.  I spoke with Dr. Carolynn and he recommended giving TXA IV, and irrigate his bladder.  If the bleeding does not improve significantly then he would like the patient admitted to the medicine service at [facility] and transported to the emergency department if there are no beds in the hospital   {Click here for ABCD2, HEART and other calculators REFRESH Note before signing:1}                              Medical Decision Making Amount and/or Complexity of Data Reviewed Labs: ordered.  Risk Prescription drug management.   Hematuria and urinary retention with bladder tumor  {Document critical care time when appropriate  Document review of labs and clinical decision tools ie CHADS2VASC2, etc  Document your independent review of radiology images and any outside records  Document your discussion with family members, caretakers and with consultants  Document social determinants of health affecting pt's care  Document your decision making why or why not admission, treatments were needed:32947:::1}   Final diagnoses:  None    ED Discharge Orders     None        "

## 2024-09-27 NOTE — Pre-Procedure Instructions (Signed)
 Called for pre-op phone call. Patient states that he has tried to call Dr Birdia office and left messages for him to call back because he is concerned. He states that he stared having a lot of bleeding form his penis on 12/29 around lunch time. He also states that he is having clots and cannot pass urine all the time and he is very unconfortable. I messaged Veleria Silversmith and Dr Sherrilee on secure chat to let them know what was going on and asked them to call patient.

## 2024-09-28 ENCOUNTER — Encounter (HOSPITAL_COMMUNITY): Payer: Self-pay | Admitting: Emergency Medicine

## 2024-09-28 ENCOUNTER — Other Ambulatory Visit: Payer: Self-pay

## 2024-09-28 ENCOUNTER — Emergency Department (HOSPITAL_COMMUNITY)
Admission: EM | Admit: 2024-09-28 | Discharge: 2024-09-28 | Disposition: A | Discharge status: Home / Self Care | Source: Home / Self Care | Attending: Emergency Medicine | Admitting: Emergency Medicine

## 2024-09-28 DIAGNOSIS — R319 Hematuria, unspecified: Secondary | ICD-10-CM | POA: Insufficient documentation

## 2024-09-28 DIAGNOSIS — R339 Retention of urine, unspecified: Secondary | ICD-10-CM | POA: Insufficient documentation

## 2024-09-28 DIAGNOSIS — R1024 Suprapubic pain: Secondary | ICD-10-CM | POA: Insufficient documentation

## 2024-09-28 LAB — BASIC METABOLIC PANEL WITH GFR
Anion gap: 16 — ABNORMAL HIGH (ref 5–15)
BUN: 16 mg/dL (ref 6–20)
CO2: 20 mmol/L — ABNORMAL LOW (ref 22–32)
Calcium: 9.3 mg/dL (ref 8.9–10.3)
Chloride: 104 mmol/L (ref 98–111)
Creatinine, Ser: 1 mg/dL (ref 0.61–1.24)
GFR, Estimated: >60 mL/min
Glucose, Bld: 93 mg/dL (ref 70–99)
Potassium: 4.3 mmol/L (ref 3.5–5.1)
Sodium: 139 mmol/L (ref 135–145)

## 2024-09-28 LAB — CBC WITH DIFFERENTIAL/PLATELET
Abs Immature Granulocytes: 0.02 K/uL (ref 0.00–0.07)
Basophils Absolute: 0 K/uL (ref 0.0–0.1)
Basophils Relative: 0 %
Eosinophils Absolute: 0.1 K/uL (ref 0.0–0.5)
Eosinophils Relative: 1 %
HCT: 40.6 % (ref 39.0–52.0)
Hemoglobin: 13.4 g/dL (ref 13.0–17.0)
Immature Granulocytes: 0 %
Lymphocytes Relative: 18 %
Lymphs Abs: 1.7 K/uL (ref 0.7–4.0)
MCH: 30.3 pg (ref 26.0–34.0)
MCHC: 33 g/dL (ref 30.0–36.0)
MCV: 91.9 fL (ref 80.0–100.0)
Monocytes Absolute: 0.6 K/uL (ref 0.1–1.0)
Monocytes Relative: 6 %
Neutro Abs: 7.2 K/uL (ref 1.7–7.7)
Neutrophils Relative %: 75 %
Platelets: 195 K/uL (ref 150–400)
RBC: 4.42 MIL/uL (ref 4.22–5.81)
RDW: 13.4 % (ref 11.5–15.5)
WBC: 9.7 K/uL (ref 4.0–10.5)
nRBC: 0 % (ref 0.0–0.2)

## 2024-09-28 MED ORDER — HYDROCODONE-ACETAMINOPHEN 5-325 MG PO TABS
2.0000 | ORAL_TABLET | Freq: Once | ORAL | Status: AC
  Administered 2024-09-28: 2 via ORAL
  Filled 2024-09-28: qty 2

## 2024-09-28 MED ORDER — HYDROMORPHONE HCL 1 MG/ML IJ SOLN
1.0000 mg | Freq: Once | INTRAMUSCULAR | Status: AC
  Administered 2024-09-28: 1 mg via INTRAVENOUS
  Filled 2024-09-28: qty 1

## 2024-09-28 MED ORDER — CEPHALEXIN 500 MG PO CAPS: 500.0000 mg | ORAL_CAPSULE | Freq: Four times a day (QID) | ORAL | 0 refills | Status: AC

## 2024-09-28 MED ORDER — CEPHALEXIN 500 MG PO CAPS
500.0000 mg | ORAL_CAPSULE | Freq: Once | ORAL | Status: AC
  Administered 2024-09-28: 500 mg via ORAL
  Filled 2024-09-28: qty 1

## 2024-09-28 MED ORDER — ONDANSETRON HCL 4 MG/2ML IJ SOLN
4.0000 mg | Freq: Once | INTRAMUSCULAR | Status: AC
  Administered 2024-09-28: 4 mg via INTRAVENOUS
  Filled 2024-09-28: qty 2

## 2024-09-28 NOTE — ED Notes (Signed)
 Waiting for a 3 way coude foley per Zammit, MD. [facility] to bring catheter.

## 2024-09-28 NOTE — Discharge Instructions (Signed)
 Follow-up Monday to get your surgery.  If you have any problems between now and Monday you should go to [facility] in [place] 

## 2024-09-28 NOTE — ED Provider Notes (Signed)
 " [place] EMERGENCY DEPARTMENT AT [facility] Provider Note   CSN: [id number] Arrival date & time: 09/28/24  1558     Patient presents with: Urinary Retention   Christian Kennedy is a 59 y.o. male.  {Add pertinent medical, surgical, social history, OB history to YEP:67052} Patient complains of urinary retention.  He was seen yesterday and had a Foley placed and it showed hematuria.  When they tried to put a three-way Foley and they were unable to get it and he left AMA.  Patient has a bladder tumor and he is having surgery Monday   Abdominal Pain      Prior to Admission medications  Medication Sig Start Date End Date Taking? Authorizing Provider  cephALEXin  (KEFLEX ) 500 MG capsule Take 1 capsule (500 mg total) by mouth 4 (four) times daily. 09/28/24  Yes Keir Foland, MD  Aspirin-Acetaminophen -Caffeine (GOODYS EXTRA STRENGTH) 520-260-32.5 MG PACK Take 1-2 Packages by mouth every 6 (six) hours as needed (Pain).    [provider]  b complex vitamins capsule Take 1 capsule by mouth daily.    [provider]  BLACK CURRANT SEED OIL PO Take 1 capsule by mouth daily.    [provider]  butalbital -apap-caffeine-codeine (FIORICET WITH CODEINE) 50-325-40-30 MG capsule Take 1-2 capsules by mouth every 6 (six) hours as needed for headache or other (pain). 07/21/24   Plotnikov, Aleksei V, MD  Cholecalciferol (VITAMIN D3) 50 MCG (2000 UT) CAPS Take 1 capsule (2,000 Units total) by mouth daily. Patient taking differently: Take 4,000-6,000 Units by mouth daily. 11/17/22   Plotnikov, Aleksei V, MD  HYDROcodone -acetaminophen  (NORCO) 10-325 MG tablet Take 1 tablet by mouth every 8 (eight) hours as needed. 09/05/24 10/05/24  Plotnikov, Aleksei V, MD  ketorolac  (TORADOL ) 10 MG tablet Take 1 tablet (10 mg total) by mouth every 8 (eight) hours as needed. 09/05/24   Plotnikov, [name], MD  lidocaine  (XYLOCAINE ) 1 % (with preservative) injection Use 1-2 ML as diluent  for  1G Rocephin  IM injection daily Patient not taking: Reported on 09/25/2024 05/25/24   Plotnikov, Aleksei V, MD  oxybutynin  (DITROPAN ) 5 MG tablet Take 1 tablet (5 mg total) by mouth 3 (three) times daily. 09/05/24   Plotnikov, Aleksei V, MD  RABEprazole  (ACIPHEX ) 20 MG tablet Take 1 tablet (20 mg total) by mouth 2 (two) times daily. 07/21/24   Plotnikov, Aleksei V, MD  sodium chloride  0.9 % injection Inject 3 mLs into the vein as needed. Inject 3mLs daily Patient not taking: Reported on 09/25/2024 08/10/24   Plotnikov, [name], MD  sodium chloride  flush (BD POSIFLUSH) 0.9 % SOLN injection USE TEN MLS AS DIRECTED PER MD Patient not taking: Reported on 09/25/2024 08/08/24   Plotnikov, [name], MD  SYRINGE-NEEDLE, DISP, 3 ML (BD ECLIPSE SYRINGE/NEEDLE) 23G X 1 3 ML MISC Use every day for IM injections (Rocephin ) 05/25/24   Plotnikov, [name], MD    Allergies: Macrobid  [nitrofurantoin ]    Review of Systems  Gastrointestinal:  Positive for abdominal pain.    Updated Vital Signs BP (!) 142/81 (BP Location: Right Arm)   Pulse 92   Temp 98.2 F (36.8 C) (Oral)   Resp 17   Ht 6' 3 (1.905 m)   Wt 117.9 kg   SpO2 99%   BMI 32.49 kg/m   Physical Exam  (all labs ordered are listed, but only abnormal results are displayed) Labs Reviewed  BASIC METABOLIC PANEL WITH GFR - Abnormal; Notable for the following components:  Result Value   CO2 20 (*)    Anion gap 16 (*)    All other components within normal limits  CBC WITH DIFFERENTIAL/PLATELET    EKG: None  Radiology: No results found.  {Document cardiac monitor, telemetry assessment procedure when appropriate:32947} Procedures   Medications Ordered in the ED  cephALEXin  (KEFLEX ) capsule 500 mg (has no administration in time range)  HYDROmorphone  (DILAUDID ) injection 1 mg (1 mg Intravenous Given 09/28/24 1642)  ondansetron  (ZOFRAN ) injection 4 mg (4 mg Intravenous Given 09/28/24 1644)      {Click here for ABCD2, HEART and  other calculators REFRESH Note before signing:1}                              Medical Decision Making Amount and/or Complexity of Data Reviewed Labs: ordered.  Risk Prescription drug management.  Urinary retention with hematuria  {Document critical care time when appropriate  Document review of labs and clinical decision tools ie CHADS2VASC2, etc  Document your independent review of radiology images and any outside records  Document your discussion with family members, caretakers and with consultants  Document social determinants of health affecting pt's care  Document your decision making why or why not admission, treatments were needed:32947:::1}   Final diagnoses:  Urinary retention    ED Discharge Orders          Ordered    cephALEXin  (KEFLEX ) 500 MG capsule  4 times daily        09/28/24 1834             "

## 2024-09-28 NOTE — ED Triage Notes (Signed)
 Pov urinary retention. Pt last urinated, with dribbles, at 1600. Pt states symptoms are the same when pt was in this ED for the same. Pt states blood in urine has gone and urine is just dark brown.

## 2024-09-28 NOTE — ED Provider Notes (Signed)
 Patient now refusing all catheter use He wants to be discharged Discussed at length need for catheter due to likely obstruction but he refuses D/w patient and his daughter He understands the risk If he has any worsening he plans to return    Midge Golas, MD 09/28/24 [phone]

## 2024-09-28 NOTE — ED Notes (Addendum)
 Attempted to replace foley with 3-way coude for CBI with Teacher, English As [employer] Foreign Language. Unable to get new catheter inserted due to resistance. Patient could not tolerate and more attempts. EDP notified

## 2024-09-29 LAB — URINE CULTURE: Culture: <10000 — AB

## 2024-09-29 MED ORDER — GEMCITABINE CHEMO FOR BLADDER INSTILLATION 2000 MG
2000.0000 mg | Freq: Once | INTRAVENOUS | Status: DC
  Filled 2024-09-29: qty 52.6

## 2024-10-02 ENCOUNTER — Encounter (HOSPITAL_COMMUNITY): Payer: Self-pay

## 2024-10-02 ENCOUNTER — Encounter (HOSPITAL_COMMUNITY): Payer: Self-pay | Admitting: Urology

## 2024-10-02 ENCOUNTER — Other Ambulatory Visit: Payer: Self-pay

## 2024-10-02 ENCOUNTER — Ambulatory Visit (HOSPITAL_COMMUNITY)
Admission: RE | Admit: 2024-10-02 | Discharge: 2024-10-02 | Disposition: A | Discharge status: Home / Self Care | Payer: Self-pay | Attending: Urology | Admitting: Urology

## 2024-10-02 ENCOUNTER — Encounter (HOSPITAL_COMMUNITY)
Admission: RE | Disposition: A | Discharge status: Home / Self Care | Payer: Self-pay | Source: Home / Self Care | Attending: Urology

## 2024-10-02 ENCOUNTER — Ambulatory Visit (HOSPITAL_COMMUNITY): Payer: Self-pay

## 2024-10-02 DIAGNOSIS — F17210 Nicotine dependence, cigarettes, uncomplicated: Secondary | ICD-10-CM | POA: Diagnosis not present

## 2024-10-02 DIAGNOSIS — C678 Malignant neoplasm of overlapping sites of bladder: Secondary | ICD-10-CM

## 2024-10-02 DIAGNOSIS — C679 Malignant neoplasm of bladder, unspecified: Secondary | ICD-10-CM

## 2024-10-02 DIAGNOSIS — K219 Gastro-esophageal reflux disease without esophagitis: Secondary | ICD-10-CM | POA: Insufficient documentation

## 2024-10-02 DIAGNOSIS — C672 Malignant neoplasm of lateral wall of bladder: Secondary | ICD-10-CM | POA: Diagnosis present

## 2024-10-02 DIAGNOSIS — R338 Other retention of urine: Secondary | ICD-10-CM | POA: Diagnosis not present

## 2024-10-02 SURGERY — TURBT, WITH CHEMOTHERAPEUTIC AGENT INSTILLATION INTO BLADDER
Anesthesia: General | Site: Bladder

## 2024-10-02 MED ORDER — SUGAMMADEX SODIUM 200 MG/2ML IV SOLN
INTRAVENOUS | Status: DC | PRN
  Administered 2024-10-02: 480 mg via INTRAVENOUS

## 2024-10-02 MED ORDER — FENTANYL CITRATE (PF) 100 MCG/2ML IJ SOLN
INTRAMUSCULAR | Status: AC
  Filled 2024-10-02: qty 2

## 2024-10-02 MED ORDER — ROCURONIUM BROMIDE 10 MG/ML (PF) SYRINGE
PREFILLED_SYRINGE | INTRAVENOUS | Status: AC
  Filled 2024-10-02: qty 20

## 2024-10-02 MED ORDER — LACTATED RINGERS IV SOLN: INTRAVENOUS | Status: DC

## 2024-10-02 MED ORDER — ONDANSETRON HCL 4 MG/2ML IJ SOLN: 4.0000 mg | Freq: Once | INTRAMUSCULAR | Status: DC | PRN

## 2024-10-02 MED ORDER — HYDROMORPHONE HCL 1 MG/ML IJ SOLN
INTRAMUSCULAR | Status: AC
  Filled 2024-10-02: qty 0.5

## 2024-10-02 MED ORDER — DEXMEDETOMIDINE HCL IN NACL 80 MCG/20ML IV SOLN
INTRAVENOUS | Status: AC
  Filled 2024-10-02: qty 20

## 2024-10-02 MED ORDER — FENTANYL CITRATE (PF) 50 MCG/ML IJ SOSY
25.0000 ug | PREFILLED_SYRINGE | INTRAMUSCULAR | Status: DC | PRN
  Administered 2024-10-02 (×3): 50 ug via INTRAVENOUS
  Filled 2024-10-02 (×2): qty 1

## 2024-10-02 MED ORDER — MIDAZOLAM HCL 2 MG/2ML IJ SOLN
INTRAMUSCULAR | Status: AC
  Filled 2024-10-02: qty 2

## 2024-10-02 MED ORDER — GEMCITABINE CHEMO FOR BLADDER INSTILLATION 2000 MG
INTRAVENOUS | Status: DC | PRN
  Administered 2024-10-02: 2000 mg via INTRAVESICAL

## 2024-10-02 MED ORDER — PHENYLEPHRINE 80 MCG/ML (10ML) SYRINGE FOR IV PUSH (FOR BLOOD PRESSURE SUPPORT)
PREFILLED_SYRINGE | INTRAVENOUS | Status: AC
  Filled 2024-10-02: qty 10

## 2024-10-02 MED ORDER — ORAL CARE MOUTH RINSE: 15.0000 mL | Freq: Once | OROMUCOSAL | Status: DC

## 2024-10-02 MED ORDER — PROPOFOL 500 MG/50ML IV EMUL
INTRAVENOUS | Status: DC | PRN
  Administered 2024-10-02: 30 mg via INTRAVENOUS
  Administered 2024-10-02: 200 mg via INTRAVENOUS

## 2024-10-02 MED ORDER — LIDOCAINE 2% (20 MG/ML) 5 ML SYRINGE
INTRAMUSCULAR | Status: DC | PRN
  Administered 2024-10-02: 100 mg via INTRAVENOUS

## 2024-10-02 MED ORDER — DEXAMETHASONE SOD PHOSPHATE PF 10 MG/ML IJ SOLN
INTRAMUSCULAR | Status: DC | PRN
  Administered 2024-10-02: 8 mg via INTRAVENOUS

## 2024-10-02 MED ORDER — ROCURONIUM BROMIDE 10 MG/ML (PF) SYRINGE
PREFILLED_SYRINGE | INTRAVENOUS | Status: AC
  Filled 2024-10-02: qty 10

## 2024-10-02 MED ORDER — DEXMEDETOMIDINE HCL IN NACL 80 MCG/20ML IV SOLN
INTRAVENOUS | Status: DC | PRN
  Administered 2024-10-02: 8 ug via INTRAVENOUS

## 2024-10-02 MED ORDER — ROCURONIUM BROMIDE 10 MG/ML (PF) SYRINGE
PREFILLED_SYRINGE | INTRAVENOUS | Status: DC | PRN
  Administered 2024-10-02: 80 mg via INTRAVENOUS

## 2024-10-02 MED ORDER — FENTANYL CITRATE (PF) 100 MCG/2ML IJ SOLN
INTRAMUSCULAR | Status: DC | PRN
  Administered 2024-10-02 (×2): 50 ug via INTRAVENOUS
  Administered 2024-10-02: 100 ug via INTRAVENOUS

## 2024-10-02 MED ORDER — PROPOFOL 500 MG/50ML IV EMUL
INTRAVENOUS | Status: AC
  Filled 2024-10-02: qty 50

## 2024-10-02 MED ORDER — LIDOCAINE 2% (20 MG/ML) 5 ML SYRINGE
INTRAMUSCULAR | Status: AC
  Filled 2024-10-02: qty 5

## 2024-10-02 MED ORDER — HYDROMORPHONE HCL 1 MG/ML IJ SOLN
INTRAMUSCULAR | Status: DC | PRN
  Administered 2024-10-02 (×2): .5 mg via INTRAVENOUS

## 2024-10-02 MED ORDER — MIDAZOLAM HCL (PF) 2 MG/2ML IJ SOLN
INTRAMUSCULAR | Status: DC | PRN
  Administered 2024-10-02: 2 mg via INTRAVENOUS

## 2024-10-02 MED ORDER — SODIUM CHLORIDE 0.9 % IR SOLN
Status: DC | PRN
  Administered 2024-10-02 (×3): 3000 mL

## 2024-10-02 MED ORDER — CHLORHEXIDINE GLUCONATE 0.12 % MT SOLN
15.0000 mL | Freq: Once | OROMUCOSAL | Status: AC
  Administered 2024-10-02: 15 mL via OROMUCOSAL

## 2024-10-02 MED ORDER — ONDANSETRON HCL 4 MG/2ML IJ SOLN
INTRAMUSCULAR | Status: DC | PRN
  Administered 2024-10-02: 4 mg via INTRAVENOUS

## 2024-10-02 MED ORDER — FENTANYL CITRATE (PF) 50 MCG/ML IJ SOSY
PREFILLED_SYRINGE | INTRAMUSCULAR | Status: AC
  Filled 2024-10-02: qty 1

## 2024-10-02 MED ORDER — STERILE WATER FOR IRRIGATION IR SOLN
Status: DC | PRN
  Administered 2024-10-02: 1000 mL

## 2024-10-02 MED ORDER — CEFAZOLIN SODIUM-DEXTROSE 2-4 GM/100ML-% IV SOLN
2.0000 g | INTRAVENOUS | Status: AC
  Administered 2024-10-02: 2 g via INTRAVENOUS
  Filled 2024-10-02: qty 100

## 2024-10-02 MED ORDER — OXYCODONE HCL 5 MG/5ML PO SOLN: 5.0000 mg | Freq: Once | ORAL | Status: AC | PRN

## 2024-10-02 MED ORDER — SUGAMMADEX SODIUM 200 MG/2ML IV SOLN
INTRAVENOUS | Status: AC
  Filled 2024-10-02: qty 6

## 2024-10-02 MED ORDER — ONDANSETRON HCL 4 MG/2ML IJ SOLN
INTRAMUSCULAR | Status: AC
  Filled 2024-10-02: qty 2

## 2024-10-02 MED ORDER — OXYCODONE HCL 5 MG PO TABS
5.0000 mg | ORAL_TABLET | Freq: Once | ORAL | Status: AC | PRN
  Administered 2024-10-02: 5 mg via ORAL
  Filled 2024-10-02: qty 1

## 2024-10-02 MED ORDER — HYDROCODONE-ACETAMINOPHEN 5-325 MG PO TABS: 1.0000 | ORAL_TABLET | Freq: Four times a day (QID) | ORAL | 0 refills | Status: AC | PRN

## 2024-10-02 MED ORDER — CHLORHEXIDINE GLUCONATE 0.12 % MT SOLN: 15.0000 mL | Freq: Once | OROMUCOSAL | Status: DC

## 2024-10-02 SURGICAL SUPPLY — 20 items
BAG DRAIN URO TABLE W/ADPT NS (BAG) ×1 IMPLANT
BAG HAMPER (MISCELLANEOUS) ×1 IMPLANT
BAG URINE DRAIN 2000ML AR STRL (UROLOGICAL SUPPLIES) ×1 IMPLANT
CATH FOLEY 3WAY 30CC 22FR (CATHETERS) IMPLANT
CLOTH BEACON ORANGE TIMEOUT ST (SAFETY) ×1 IMPLANT
ELECTRODE LOOP 22F BIPOLAR SML (ELECTROSURGICAL) ×1 IMPLANT
GLOVE BIO SURGEON STRL SZ8 (GLOVE) ×1 IMPLANT
GLOVE BIOGEL PI IND STRL 7.0 (GLOVE) ×2 IMPLANT
GOWN STRL REUS W/TWL LRG LVL3 (GOWN DISPOSABLE) ×1 IMPLANT
GOWN STRL REUS W/TWL XL LVL3 (GOWN DISPOSABLE) ×1 IMPLANT
KIT CHEMO SPILL (MISCELLANEOUS) ×1 IMPLANT
KIT TURNOVER CYSTO (KITS) ×1 IMPLANT
PACK CYSTO (CUSTOM PROCEDURE TRAY) ×1 IMPLANT
PAD ARMBOARD POSITIONER FOAM (MISCELLANEOUS) ×1 IMPLANT
PLUG CATH AND CAP STRL 200 (CATHETERS) IMPLANT
POSITIONER HEAD 8X9X4 ADT (SOFTGOODS) ×1 IMPLANT
SOL .9 NS 3000ML IRR UROMATIC (IV SOLUTION) ×2 IMPLANT
SYR 30ML LL (SYRINGE) ×1 IMPLANT
SYRINGE TOOMEY IRRIG 70ML (MISCELLANEOUS) ×1 IMPLANT
TOWEL OR DSP BLU STD STRL 2/PK (DISPOSABLE) ×2 IMPLANT

## 2024-10-02 NOTE — H&P (Signed)
 HPI: [name] is a 59yo here for followup for urinary retention. He passed his voiding trial today. He has a known enlarging right lateral wall bladder tumor. He denies any recent gross hematuria. He denies any flank or pelvic pain. NO other complaints today     PMH:     Past Medical History:  Diagnosis Date   Abscess      Right buttocks   Bladder tumor     Depression      Son passed 2014   Ear infection     Eating disorder     Frequent headaches     GERD (gastroesophageal reflux disease)     Helicobacter pylori ab+ 10/27/2017   Hepatitis      childhood, jaundice   Migraines     Urinary incontinence     Varicose vein of leg      Right leg   Vitamin D  deficiency            Surgical History:      Past Surgical History:  Procedure Laterality Date   CYSTOSCOPY W/ RETROGRADES Bilateral 11/12/2022    Procedure: CYSTOSCOPY WITH RETROGRADE PYELOGRAM;  Surgeon: Sherrilee Belvie CROME, MD;  Location: [facility];  Service: Urology;  Laterality: Bilateral;   CYSTOSCOPY W/ URETERAL STENT PLACEMENT Right 11/19/2017    Procedure: CYSTOSCOPY WITH RETROGRADE PYELOGRAM/URETERAL STENT PLACEMENT;  Surgeon: Devere Lonni Righter, MD;  Location: [facility];  Service: Urology;  Laterality: Right;   CYSTOSCOPY WITH FULGERATION N/A 11/19/2017    Procedure: CYSTOSCOPY WITH FULGERATION AND CLOT EVACUATION;  Surgeon: Chauncey Redell Agent, MD;  Location: [facility];  Service: Urology;  Laterality: N/A;   IR NEPHROSTOMY EXCHANGE RIGHT   07/20/2022   IR NEPHROSTOMY EXCHANGE RIGHT   10/06/2022   IR NEPHROSTOMY EXCHANGE RIGHT   12/01/2022   IR NEPHROSTOMY EXCHANGE RIGHT   02/02/2023   IR NEPHROSTOMY EXCHANGE RIGHT   04/06/2023   IR NEPHROSTOMY EXCHANGE RIGHT   04/20/2023   IR NEPHROSTOMY EXCHANGE RIGHT   06/15/2023   IR NEPHROSTOMY EXCHANGE RIGHT   08/17/2023   IR NEPHROSTOMY EXCHANGE RIGHT   09/17/2023   IR NEPHROSTOMY EXCHANGE RIGHT   11/08/2023   IR NEPHROSTOMY EXCHANGE RIGHT   12/21/2023   IR  NEPHROSTOMY EXCHANGE RIGHT   02/15/2024   IR NEPHROSTOMY EXCHANGE RIGHT   04/11/2024   IR NEPHROSTOMY EXCHANGE RIGHT   06/05/2024   IR NEPHROSTOMY EXCHANGE RIGHT   08/01/2024   IR NEPHROSTOMY PLACEMENT RIGHT   05/26/2022   TRANSURETHRAL RESECTION OF BLADDER TUMOR N/A 11/19/2017    Procedure: TRANSURETHRAL RESECTION OF BLADDER TUMOR (TURBT);  Surgeon: Devere Lonni Righter, MD;  Location: [facility];  Service: Urology;  Laterality: N/A;   TRANSURETHRAL RESECTION OF BLADDER TUMOR N/A 05/18/2022    Procedure: TRANSURETHRAL RESECTION OF BLADDER TUMOR (TURBT);  Surgeon: Sherrilee Belvie CROME, MD;  Location: [facility];  Service: Urology;  Laterality: N/A;   TRANSURETHRAL RESECTION OF BLADDER TUMOR N/A 11/12/2022    Procedure: TRANSURETHRAL RESECTION OF BLADDER TUMOR (TURBT);  Surgeon: Sherrilee Belvie CROME, MD;  Location: [facility];  Service: Urology;  Laterality: N/A;          Home Medications:  Allergies as of 08/16/2024         Reactions    Macrobid  [nitrofurantoin ]      ?epig abd pain            Medication List           Accurate as of  August 16, 2024  9:47 AM. If you have any questions, ask your nurse or doctor.              AMBULATORY NON FORMULARY MEDICATION Medication Name: Sodium Chloride  flush   Use 70 cc syringe to flush    amoxicillin -clavulanate 875-125 MG tablet Commonly known as: AUGMENTIN  Take 1 tablet by mouth 2 (two) times daily.    b complex vitamins capsule Take 1 capsule by mouth daily.    B-D 3CC LUER-LOK SYR 23GX1 23G X 1 3 ML Misc Generic drug: SYRINGE-NEEDLE (DISP) 3 ML Use every day for IM injections (Rocephin )    BD PosiFlush 0.9 % Soln injection Generic drug: sodium chloride  flush USE TEN MLS AS DIRECTED PER MD    BLACK CURRANT SEED OIL PO Take 1 capsule by mouth daily.    butalbital -apap-caffeine-codeine 50-325-40-30 MG capsule Commonly known as: FIORICET WITH CODEINE Take 1-2 capsules by mouth every 6 (six) hours as needed for  headache or other (pain).    cephALEXin  500 MG capsule Commonly known as: KEFLEX  Take 1 capsule (500 mg total) by mouth 4 (four) times daily.    Goodys Extra Strength 520-260-32.5 MG Pack Generic drug: Aspirin-Acetaminophen -Caffeine Take 1-2 Packages by mouth every 6 (six) hours as needed (Pain).    ketorolac  10 MG tablet Commonly known as: TORADOL  Take 1 tablet (10 mg total) by mouth every 8 (eight) hours as needed.    lidocaine  1 % (with preservative) injection Commonly known as: XYLOCAINE  Use 1-2 ML as diluent  for 1G Rocephin  IM injection daily    oxybutynin  5 MG tablet Commonly known as: DITROPAN  Take 1 tablet (5 mg total) by mouth 3 (three) times daily.    RABEprazole  20 MG tablet Commonly known as: Aciphex  Take 1 tablet (20 mg total) by mouth 2 (two) times daily.    sodium chloride  0.9 % injection Inject 3 mLs into the vein as needed. Inject 3mLs daily    traMADol  50 MG tablet Commonly known as: ULTRAM  Take 1 tablet (50 mg total) by mouth every 8 (eight) hours as needed.    Vitamin D3 50 MCG (2000 UT) capsule Take 1 capsule (2,000 Units total) by mouth daily.             Allergies:  Allergies       Allergies  Allergen Reactions   Macrobid  [Nitrofurantoin ]        ?epig abd pain        Family History:      Family History  Problem Relation Age of Onset   Cancer Mother     Depression Mother     Arthritis Father     Depression Father     Hearing loss Father     Depression Daughter     Early death Son            Social History:  reports that he has been smoking cigarettes. He has a 30 pack-year smoking history. He has never used smokeless tobacco. He reports that he does not drink alcohol and does not use drugs.   ROS: All other review of systems were reviewed and are negative except what is noted above in HPI   Physical Exam: BP 124/73   Pulse 90   Constitutional:  Alert and oriented, No acute distress. HEENT: [place] AT, moist mucus membranes.   Trachea midline, no masses. Cardiovascular: No clubbing, cyanosis, or edema. Respiratory: Normal respiratory effort, no increased work of breathing. GI: Abdomen is soft, nontender, nondistended, no  abdominal masses GU: No CVA tenderness.  Lymph: No cervical or inguinal lymphadenopathy. Skin: No rashes, bruises or suspicious lesions. Neurologic: Grossly intact, no focal deficits, moving all 4 extremities. Psychiatric: Normal mood and affect.   Laboratory Data: Recent Labs       Lab Results  Component Value Date    WBC 13.0 (H) 08/06/2024    HGB 15.7 08/06/2024    HCT 47.6 08/06/2024    MCV 92.2 08/06/2024    PLT 229 08/06/2024        Recent Labs       Lab Results  Component Value Date    CREATININE 1.39 (H) 08/06/2024        Recent Labs       Lab Results  Component Value Date    PSA 1.18 10/08/2021    PSA 1.30 09/15/2017        Recent Labs       Lab Results  Component Value Date    TESTOSTERONE  261.70 (L) 09/15/2017        Recent Labs       Lab Results  Component Value Date    HGBA1C 5.4 05/05/2024        Urinalysis Labs (Brief)          Component Value Date/Time    COLORURINE YELLOW 08/07/2024 0016    APPEARANCEUR CLOUDY (A) 08/07/2024 0016    APPEARANCEUR Cloudy (A) 06/28/2024 1033    LABSPEC 1.018 08/07/2024 0016    PHURINE 6.0 08/07/2024 0016    GLUCOSEU NEGATIVE 08/07/2024 0016    GLUCOSEU NEGATIVE 12/29/2023 1552    HGBUR LARGE (A) 08/07/2024 0016    BILIRUBINUR NEGATIVE 08/07/2024 0016    BILIRUBINUR Negative 06/28/2024 1033    KETONESUR NEGATIVE 08/07/2024 0016    PROTEINUR 100 (A) 08/07/2024 0016    UROBILINOGEN 0.2 12/29/2023 1552    NITRITE NEGATIVE 08/07/2024 0016    LEUKOCYTESUR NEGATIVE 08/07/2024 0016        Recent Labs       Lab Results  Component Value Date    LABMICR See below: 06/28/2024    WBCUA >30 (A) 06/28/2024    LABEPIT 0-10 06/28/2024    MUCUS Presence of (A) 12/29/2023    BACTERIA NONE SEEN 08/07/2024         Pertinent Imaging:   No results found for this or any previous visit.   No results found for this or any previous visit.   No results found for this or any previous visit.   No results found for this or any previous visit.   No results found for this or any previous visit.   No results found for this or any previous visit.   No results found for this or any previous visit.   Results for orders placed during the hospital encounter of 08/06/24   CT Renal Stone Study   Narrative EXAM: CT UROGRAM 08/06/2024 11:43:08 PM   TECHNIQUE: CT of the abdomen and pelvis was performed without the administration of intravenous contrast. Multiplanar reformatted images as well as MIP urogram images are provided for review. Automated exposure control, iterative reconstruction, and/or weight based adjustment of the mA/kV was utilized to reduce the radiation dose to as low as reasonably achievable.   COMPARISON: 05/05/2024   CLINICAL HISTORY: Abdominal/flank pain, stone suspected.   FINDINGS:   LOWER CHEST: No acute abnormality.   LIVER: The liver is unremarkable.   GALLBLADDER AND BILE DUCTS: Gallbladder is unremarkable. No biliary ductal dilatation.  SPLEEN: No acute abnormality.   PANCREAS: No acute abnormality.   ADRENAL GLANDS: No acute abnormality.   KIDNEYS, URETERS AND BLADDER: Mild right renal atrophy, chronic. Indwelling right percutaneous nephrostomy catheter. Moderate layering bladder hemorrhage (image 77). 6.7 x 5.3 cm right posterolateral bladder mass (image 23), overlying the right ureteral orifice, suggesting primary bladder cancer. No stones in the kidneys or ureters. No hydronephrosis. No perinephric or periureteral stranding.   GI AND BOWEL: Stomach demonstrates no acute abnormality. Normal appendix (image 60). There is no bowel obstruction.   PERITONEUM AND RETROPERITONEUM: No ascites. No free air.   VASCULATURE: Aorta is normal in  caliber. Atherosclerotic calcifications of the abdominal aorta and branch vessels.   LYMPH NODES: No lymphadenopathy.   REPRODUCTIVE ORGANS: No acute abnormality.   BONES AND SOFT TISSUES: Mild degenerative changes of the lower thoracic spine. No acute osseous abnormality. No focal soft tissue abnormality.   IMPRESSION: 1. 6.7 cm right posterolateral bladder mass overlying the right ureteral orifice, suggesting primary bladder cancer. 2. Moderate layering bladder hemorrhage. 3. Indwelling right nephrostomy. No hydronephrosis.   Electronically signed by: Pinkie Pebbles MD 08/06/2024 11:52 PM EST RP Workstation: [id number]     Assessment & Plan:     1. Malignant neoplasm of lateral wall of urinary bladder (HCC) (Primary) The risks/benefits/alteranitves to transurethral resection of a bladder tumor was explained to the patient and he understands and wishes to proceed with surgery

## 2024-10-02 NOTE — Anesthesia Preprocedure Evaluation (Addendum)
"                                    Anesthesia Evaluation  Patient identified by MRN, date of birth, ID band Patient awake    Reviewed: Allergy & Precautions, H&P , NPO status , Patient's Chart, lab work & pertinent test results, reviewed documented beta blocker date and time   Airway Mallampati: II  TM Distance: >3 FB Neck ROM: full    Dental no notable dental hx.    Pulmonary Current Smoker   Pulmonary exam normal breath sounds clear to auscultation       Cardiovascular Exercise Tolerance: Good hypertension, negative cardio ROS  Rhythm:regular Rate:Normal     Neuro/Psych  Headaches PSYCHIATRIC DISORDERS Anxiety Depression       GI/Hepatic ,GERD  ,,(+) Hepatitis -  Endo/Other  negative endocrine ROS    Renal/GU Renal disease  negative genitourinary   Musculoskeletal   Abdominal   Peds  Hematology negative hematology ROS (+)   Anesthesia Other Findings   Reproductive/Obstetrics negative OB ROS                              Anesthesia Physical Anesthesia Plan  ASA: 2  Anesthesia Plan: General, General LMA and General ETT   Post-op Pain Management:    Induction:   PONV Risk Score and Plan: Ondansetron   Airway Management Planned:   Additional Equipment:   Intra-op Plan:   Post-operative Plan:   Informed Consent: I have reviewed the patients History and Physical, chart, labs and discussed the procedure including the risks, benefits and alternatives for the proposed anesthesia with the patient or authorized representative who has indicated his/her understanding and acceptance.     Dental Advisory Given  Plan Discussed with: CRNA  Anesthesia Plan Comments:          Anesthesia Quick Evaluation  "

## 2024-10-02 NOTE — Anesthesia Postprocedure Evaluation (Signed)
"   Anesthesia Post Note  Patient: Christian Kennedy  Procedure(s) Performed: TURBT, WITH CHEMOTHERAPEUTIC AGENT INSTILLATION INTO BLADDER (Bladder)  Patient location during evaluation: Phase II Anesthesia Type: General Level of consciousness: awake Pain management: pain level controlled Vital Signs Assessment: post-procedure vital signs reviewed and stable Respiratory status: spontaneous breathing and respiratory function stable Cardiovascular status: blood pressure returned to baseline and stable Postop Assessment: no headache and no apparent nausea or vomiting Anesthetic complications: no Comments: Late entry   No notable events documented.   Last Vitals:  Vitals:   10/02/24 0915 10/02/24 0917  BP: (!) 140/69 (!) 140/69  Pulse: 69 75  Resp: 12 13  Temp:    SpO2: 100% 100%    Last Pain:  Vitals:   10/02/24 0927  TempSrc:   PainSc: 7                  [name]      "

## 2024-10-02 NOTE — Addendum Note (Signed)
 Addendum  created 10/02/24 1324 by Para Jerelene CROME, CRNA   Clinical Note Signed

## 2024-10-02 NOTE — Op Note (Signed)
 Preoperative diagnosis: bladder cancer  Postoperative diagnosis: Same  Procedure: 1 cystoscopy 2.  Clot evacuation 3. Transurethral resection of bladder tumor, large 4. Instillation of bladder chemotherapy agent  Attending: Belvie Standing  Anesthesia: General  Estimated blood loss: Minimal  Drains: 22 French foley  Specimens: bladder tumor  Antibiotics: ancef   Findings: 10cm sessile right lateral wall tumor extending to dome.   Indications: Patient is a 59 year old male with a history of bladder cancer and gross hematuria.  After discussing treatment options, they decided proceed with transurethral resection of a bladder tumor.  Procedure in detail: The patient was brought to the operating room and a brief timeout was done to ensure correct patient, correct procedure, correct site.  General anesthesia was administered patient was placed in dorsal lithotomy position.  Their genitalia was then prepped and draped in usual sterile fashion.  A rigid 22 French cystoscope was passed in the urethra and the bladder.  Bladder was inspected and we noted a large amount of clot and a 10cm bladder tumor.  the ureteral orifices were in the normal orthotopic locations.  a 6 french ureteral catheter was then instilled into the left ureteral orifice.  a gentle retrograde was obtained and findings noted above. We then turned our attention to the right side. a 6 french ureteral catheter was then instilled into the right ureteral orifice.  a gentle retrograde was obtained and findings noted above. We then removed the cystoscope and placed a resectoscope into the bladder. We proceeded to remove the large clot burden from the bladder. Once this was complete we turned our attention to the bladder tumor. Using the bipolar resectoscope we resected the bladder tumor until we identified bladder muscle at the lateral edges of the tumor. The tumor extended past the bladder wall. Hemostasis was then obtained with  electrocautery. We then removed the bladder tumor chips and sent them for pathology. We then re-inspected the bladder and found no residual bleeding.  the bladder was then drained, a 22 French foley was placed, 2g of gemcitabine  was instilled into the bladder, and this concluded the procedure which was well tolerated by patient.  Complications: None  Condition: Stable, extubated, transferred to PACU  Plan: Patient is to have their foley drained after 1 hour.  They will be discharged home and followup in 5 days for foley catheter removal and pathology discussion.

## 2024-10-02 NOTE — Transfer of Care (Addendum)
 Immediate Anesthesia Transfer of Care Note  Patient: Christian Kennedy  Procedure(s) Performed: TURBT, WITH CHEMOTHERAPEUTIC AGENT INSTILLATION INTO BLADDER (Bladder)  Patient Location: PACU  Anesthesia Type:General  Level of Consciousness: drowsy and patient cooperative  Airway & Oxygen Therapy: Patient Spontanous Breathing and Patient connected to nasal cannula oxygen  Post-op Assessment: Report given to RN and Post -op Vital signs reviewed and stable  Post vital signs: Reviewed and stable  Last Vitals:  Vitals Value Taken Time  BP 144/87 10/02/24   09:00  Temp 36.6 10/02/24   09:00  Pulse 77 10/02/24 08:57  Resp 14 10/02/24 08:57  SpO2 100 % 10/02/24 08:57  Vitals shown include unfiled device data.  Last Pain:  Vitals:   10/02/24 0647  TempSrc: Oral  PainSc: 0-No pain      Patients Stated Pain Goal: 5 (10/02/24 9352)  Complications: No notable events documented.

## 2024-10-02 NOTE — Anesthesia Procedure Notes (Signed)
 Procedure Name: Intubation Date/Time: 10/02/2024 7:50 AM  Performed by: Para Jerelene CROME, CRNAPre-anesthesia Checklist: Patient identified, Emergency Drugs available, Suction available and Patient being monitored Patient Re-evaluated:Patient Re-evaluated prior to induction Oxygen Delivery Method: Circle system utilized Preoxygenation: Pre-oxygenation with 100% oxygen Induction Type: IV induction Ventilation: Mask ventilation without difficulty Laryngoscope Size: Mac and 4 Grade View: Grade II Tube type: Oral Tube size: 7.5 mm Number of attempts: 1 Airway Equipment and Method: Stylet Placement Confirmation: ETT inserted through vocal cords under direct vision, positive ETCO2, CO2 detector and breath sounds checked- equal and bilateral Secured at: 23 (OETT secured at 23 cm at lower lip.) cm Tube secured with: Tape Dental Injury: Teeth and Oropharynx as per pre-operative assessment  Comments: Atraumatic intubation x 1. Lips and teeth remains in preoperative condition.

## 2024-10-04 LAB — SURGICAL PATHOLOGY

## 2024-10-09 ENCOUNTER — Ambulatory Visit: Payer: Self-pay | Admitting: Urology

## 2024-10-09 ENCOUNTER — Encounter: Payer: Self-pay | Admitting: Urology

## 2024-10-09 VITALS — BP 130/69 | HR 84

## 2024-10-09 DIAGNOSIS — C672 Malignant neoplasm of lateral wall of bladder: Secondary | ICD-10-CM

## 2024-10-09 MED ORDER — CIPROFLOXACIN HCL 500 MG PO TABS
500.0000 mg | ORAL_TABLET | Freq: Once | ORAL | Status: AC
  Administered 2024-10-09: 500 mg via ORAL

## 2024-10-09 NOTE — Progress Notes (Signed)
 Fill and Pull Catheter Removal  Patient is present today for a catheter removal due to post TURBT.  200 ml of sterile water  was instilled into the bladder when the patient felt the urge to urinate. 25ml of water  was then drained from the balloon.  A 22 FR foley cath was removed from the bladder no complications were noted .  Foley catheter intact and time of removal. Patient as then given some time to void on their own.  Patient can void  on their own after some time.  Patient tolerated well.  One oral prophylactic antibiotic given per MD orders  Performed by: Exie T. CMA  Follow up/ Additional notes: as scheduled

## 2024-10-09 NOTE — Progress Notes (Signed)
 "  10/09/2024 9:26 AM   Christian Kennedy 01-Feb-1966 [id number]  Referring provider: Garald [name], MD [address] [place],  [place] [address]  Followup bladder cancer   HPI: Christian Kennedy is a 58yo here for followup for muscle invasive prostate cancer. He underwent bladder tumor resection 1 week ago. Pathology T2 bladder cancer. Voiding trial passed today. Urine cleared.    PMH: Past Medical History:  Diagnosis Date   Abscess    Right buttocks   Bladder tumor    Depression    Son passed 2014   Ear infection    Eating disorder    Frequent headaches    GERD (gastroesophageal reflux disease)    Helicobacter pylori ab+ 10/27/2017   Hepatitis    childhood, jaundice   Migraines    Urinary incontinence    Varicose vein of leg    Right leg   Vitamin D  deficiency     Surgical History: Past Surgical History:  Procedure Laterality Date   CYSTOSCOPY W/ RETROGRADES Bilateral 11/12/2022   Procedure: CYSTOSCOPY WITH RETROGRADE PYELOGRAM;  Surgeon: Christian Belvie CROME, MD;  Location: [facility];  Service: Urology;  Laterality: Bilateral;   CYSTOSCOPY W/ URETERAL STENT PLACEMENT Right 11/19/2017   Procedure: CYSTOSCOPY WITH RETROGRADE PYELOGRAM/URETERAL STENT PLACEMENT;  Surgeon: Christian Lonni Righter, MD;  Location: [facility];  Service: Urology;  Laterality: Right;   CYSTOSCOPY WITH FULGERATION N/A 11/19/2017   Procedure: CYSTOSCOPY WITH FULGERATION AND CLOT EVACUATION;  Surgeon: Christian Redell Agent, MD;  Location: [facility];  Service: Urology;  Laterality: N/A;   IR NEPHROSTOMY EXCHANGE RIGHT  07/20/2022   IR NEPHROSTOMY EXCHANGE RIGHT  10/06/2022   IR NEPHROSTOMY EXCHANGE RIGHT  12/01/2022   IR NEPHROSTOMY EXCHANGE RIGHT  02/02/2023   IR NEPHROSTOMY EXCHANGE RIGHT  04/06/2023   IR NEPHROSTOMY EXCHANGE RIGHT  04/20/2023   IR NEPHROSTOMY EXCHANGE RIGHT  06/15/2023   IR NEPHROSTOMY EXCHANGE RIGHT  08/17/2023   IR NEPHROSTOMY EXCHANGE RIGHT  09/17/2023   IR NEPHROSTOMY  EXCHANGE RIGHT  11/08/2023   IR NEPHROSTOMY EXCHANGE RIGHT  12/21/2023   IR NEPHROSTOMY EXCHANGE RIGHT  02/15/2024   IR NEPHROSTOMY EXCHANGE RIGHT  04/11/2024   IR NEPHROSTOMY EXCHANGE RIGHT  06/05/2024   IR NEPHROSTOMY EXCHANGE RIGHT  08/01/2024   IR NEPHROSTOMY EXCHANGE RIGHT  09/26/2024   IR NEPHROSTOMY PLACEMENT RIGHT  05/26/2022   TRANSURETHRAL RESECTION OF BLADDER TUMOR N/A 11/19/2017   Procedure: TRANSURETHRAL RESECTION OF BLADDER TUMOR (TURBT);  Surgeon: Christian Lonni Righter, MD;  Location: [facility];  Service: Urology;  Laterality: N/A;   TRANSURETHRAL RESECTION OF BLADDER TUMOR N/A 05/18/2022   Procedure: TRANSURETHRAL RESECTION OF BLADDER TUMOR (TURBT);  Surgeon: Christian Belvie CROME, MD;  Location: [facility];  Service: Urology;  Laterality: N/A;   TRANSURETHRAL RESECTION OF BLADDER TUMOR N/A 11/12/2022   Procedure: TRANSURETHRAL RESECTION OF BLADDER TUMOR (TURBT);  Surgeon: Christian Belvie CROME, MD;  Location: [facility];  Service: Urology;  Laterality: N/A;    Home Medications:  Allergies as of 10/09/2024       Reactions   Macrobid  [nitrofurantoin ]    Pt doesn't think allergic. Took on empty stomach and thinks that's why it upset his belly        Medication List        Accurate as of October 09, 2024  9:26 AM. If you have any questions, ask your nurse or doctor.          b complex vitamins capsule Take 1  capsule by mouth daily.   B-D 3CC LUER-LOK SYR 23GX1 23G X 1 3 ML Misc Generic drug: SYRINGE-NEEDLE (DISP) 3 ML Use every day for IM injections (Rocephin )   BD PosiFlush 0.9 % Soln injection Generic drug: sodium chloride  flush USE TEN MLS AS DIRECTED PER MD   BLACK CURRANT SEED OIL PO Take 1 capsule by mouth daily.   butalbital -apap-caffeine-codeine 50-325-40-30 MG capsule Commonly known as: FIORICET WITH CODEINE Take 1-2 capsules by mouth every 6 (six) hours as needed for headache or other (pain).   cephALEXin  500 MG capsule Commonly known as:  KEFLEX  Take 1 capsule (500 mg total) by mouth 4 (four) times daily.   diphenhydramine-acetaminophen  25-500 MG Tabs tablet Commonly known as: TYLENOL  PM Take 1 tablet by mouth at bedtime as needed.   Goodys Extra Strength 520-260-32.5 MG Pack Generic drug: Aspirin-Acetaminophen -Caffeine Take 1-2 Packages by mouth every 6 (six) hours as needed (Pain).   HYDROcodone -acetaminophen  5-325 MG tablet Commonly known as: NORCO/VICODIN Take 1 tablet by mouth every 6 (six) hours as needed for moderate pain (pain score 4-6) or severe pain (pain score 7-10).   ketorolac  10 MG tablet Commonly known as: TORADOL  Take 1 tablet (10 mg total) by mouth every 8 (eight) hours as needed.   lidocaine  1 % (with preservative) injection Commonly known as: XYLOCAINE  Use 1-2 ML as diluent  for 1G Rocephin  IM injection daily   oxybutynin  5 MG tablet Commonly known as: DITROPAN  Take 1 tablet (5 mg total) by mouth 3 (three) times daily.   RABEprazole  20 MG tablet Commonly known as: Aciphex  Take 1 tablet (20 mg total) by mouth 2 (two) times daily.   sodium chloride  0.9 % injection Inject 3 mLs into the vein as needed. Inject 3mLs daily   Vitamin D3 50 MCG (2000 UT) capsule Take 1 capsule (2,000 Units total) by mouth daily. What changed: how much to take        Allergies: Allergies[1]  Family History: Family History  Problem Relation Age of Onset   Cancer Mother    Depression Mother    Arthritis Father    Depression Father    Hearing loss Father    Depression Daughter    Early death Son     Social History:  reports that he has been smoking cigarettes. He has a 30 pack-year smoking history. He has never used smokeless tobacco. He reports that he does not drink alcohol and does not use drugs.  ROS: All other review of systems were reviewed and are negative except what is noted above in HPI  Physical Exam: BP 130/69   Pulse 84   Constitutional:  Alert and oriented, No acute distress. HEENT:  [place] AT, moist mucus membranes.  Trachea midline, no masses. Cardiovascular: No clubbing, cyanosis, or edema. Respiratory: Normal respiratory effort, no increased work of breathing. GI: Abdomen is soft, nontender, nondistended, no abdominal masses GU: No CVA tenderness.  Lymph: No cervical or inguinal lymphadenopathy. Skin: No rashes, bruises or suspicious lesions. Neurologic: Grossly intact, no focal deficits, moving all 4 extremities. Psychiatric: Normal mood and affect.  Laboratory Data: Lab Results  Component Value Date   WBC 9.7 09/28/2024   HGB 13.4 09/28/2024   HCT 40.6 09/28/2024   MCV 91.9 09/28/2024   PLT 195 09/28/2024    Lab Results  Component Value Date   CREATININE 1.00 09/28/2024    Lab Results  Component Value Date   PSA 1.18 10/08/2021   PSA 1.30 09/15/2017    Lab Results  Component Value Date   TESTOSTERONE  261.70 (L) 09/15/2017    Lab Results  Component Value Date   HGBA1C 5.4 05/05/2024    Urinalysis    Component Value Date/Time   COLORURINE (A) 09/27/2024 2231    TEST NOT REPORTED DUE TO COLOR INTERFERENCE OF URINE PIGMENT   APPEARANCEUR (A) 09/27/2024 2231    TEST NOT REPORTED DUE TO COLOR INTERFERENCE OF URINE PIGMENT   APPEARANCEUR Cloudy (A) 06/28/2024 1033   LABSPEC  09/27/2024 2231    TEST NOT REPORTED DUE TO COLOR INTERFERENCE OF URINE PIGMENT   PHURINE  09/27/2024 2231    TEST NOT REPORTED DUE TO COLOR INTERFERENCE OF URINE PIGMENT   GLUCOSEU (A) 09/27/2024 2231    TEST NOT REPORTED DUE TO COLOR INTERFERENCE OF URINE PIGMENT   GLUCOSEU NEGATIVE 09/05/2024 1554   HGBUR (A) 09/27/2024 2231    TEST NOT REPORTED DUE TO COLOR INTERFERENCE OF URINE PIGMENT   BILIRUBINUR (A) 09/27/2024 2231    TEST NOT REPORTED DUE TO COLOR INTERFERENCE OF URINE PIGMENT   BILIRUBINUR Negative 06/28/2024 1033   KETONESUR (A) 09/27/2024 2231    TEST NOT REPORTED DUE TO COLOR INTERFERENCE OF URINE PIGMENT   PROTEINUR (A) 09/27/2024 2231    TEST NOT  REPORTED DUE TO COLOR INTERFERENCE OF URINE PIGMENT   UROBILINOGEN 0.2 09/05/2024 1554   NITRITE (A) 09/27/2024 2231    TEST NOT REPORTED DUE TO COLOR INTERFERENCE OF URINE PIGMENT   LEUKOCYTESUR (A) 09/27/2024 2231    TEST NOT REPORTED DUE TO COLOR INTERFERENCE OF URINE PIGMENT    Lab Results  Component Value Date   LABMICR See below: 06/28/2024   WBCUA >30 (A) 06/28/2024   LABEPIT 0-10 06/28/2024   MUCUS Presence of (A) 09/05/2024   BACTERIA FEW (A) 09/27/2024    Pertinent Imaging:  No results found for this or any previous visit.  No results found for this or any previous visit.  No results found for this or any previous visit.  No results found for this or any previous visit.  No results found for this or any previous visit.  No results found for this or any previous visit.  No results found for this or any previous visit.  Results for orders placed during the hospital encounter of 08/06/24  CT Renal Stone Study  Narrative EXAM: CT UROGRAM 08/06/2024 11:43:08 PM  TECHNIQUE: CT of the abdomen and pelvis was performed without the administration of intravenous contrast. Multiplanar reformatted images as well as MIP urogram images are provided for review. Automated exposure control, iterative reconstruction, and/or weight based adjustment of the mA/kV was utilized to reduce the radiation dose to as low as reasonably achievable.  COMPARISON: 05/05/2024  CLINICAL HISTORY: Abdominal/flank pain, stone suspected.  FINDINGS:  LOWER CHEST: No acute abnormality.  LIVER: The liver is unremarkable.  GALLBLADDER AND BILE DUCTS: Gallbladder is unremarkable. No biliary ductal dilatation.  SPLEEN: No acute abnormality.  PANCREAS: No acute abnormality.  ADRENAL GLANDS: No acute abnormality.  KIDNEYS, URETERS AND BLADDER: Mild right renal atrophy, chronic. Indwelling right percutaneous nephrostomy catheter. Moderate layering bladder hemorrhage (image 77).  6.7 x 5.3 cm right posterolateral bladder mass (image 23), overlying the right ureteral orifice, suggesting primary bladder cancer. No stones in the kidneys or ureters. No hydronephrosis. No perinephric or periureteral stranding.  GI AND BOWEL: Stomach demonstrates no acute abnormality. Normal appendix (image 60). There is no bowel obstruction.  PERITONEUM AND RETROPERITONEUM: No ascites. No free air.  VASCULATURE: Aorta is normal in  caliber. Atherosclerotic calcifications of the abdominal aorta and branch vessels.  LYMPH NODES: No lymphadenopathy.  REPRODUCTIVE ORGANS: No acute abnormality.  BONES AND SOFT TISSUES: Mild degenerative changes of the lower thoracic spine. No acute osseous abnormality. No focal soft tissue abnormality.  IMPRESSION: 1. 6.7 cm right posterolateral bladder mass overlying the right ureteral orifice, suggesting primary bladder cancer. 2. Moderate layering bladder hemorrhage. 3. Indwelling right nephrostomy. No hydronephrosis.  Electronically signed by: Pinkie Pebbles MD 08/06/2024 11:52 PM EST RP Workstation: [id number]   Assessment & Plan:    1. Malignant neoplasm of lateral wall of urinary bladder (HCC) (Primary) Patient defers therapy at this time. Followup 6 months - Bladder Voiding Trial - ciprofloxacin  (CIPRO ) tablet 500 mg   No follow-ups on file.  Belvie Clara, MD  [facility] [place]      [1]  Allergies Allergen Reactions   Macrobid  [Nitrofurantoin ]     Pt doesn't think allergic. Took on empty stomach and thinks that's why it upset his belly   "

## 2024-10-09 NOTE — Patient Instructions (Signed)

## 2024-10-11 ENCOUNTER — Other Ambulatory Visit (HOSPITAL_COMMUNITY): Payer: Self-pay

## 2024-11-21 ENCOUNTER — Other Ambulatory Visit (HOSPITAL_COMMUNITY)

## 2024-12-18 ENCOUNTER — Ambulatory Visit: Admitting: Internal Medicine

## 2025-04-18 ENCOUNTER — Ambulatory Visit: Admitting: Urology
